# Patient Record
Sex: Male | Born: 1935 | Race: White | Hispanic: No | Smoking: Former smoker
Health system: Southern US, Community
[De-identification: ages and names within clinical notes are randomized; demographics above are authoritative.]

## PROBLEM LIST (undated history)

## (undated) DIAGNOSIS — I10 Essential (primary) hypertension: Secondary | ICD-10-CM

## (undated) DIAGNOSIS — C189 Malignant neoplasm of colon, unspecified: Secondary | ICD-10-CM

## (undated) DIAGNOSIS — D469 Myelodysplastic syndrome, unspecified: Principal | ICD-10-CM

## (undated) DIAGNOSIS — T8859XA Other complications of anesthesia, initial encounter: Secondary | ICD-10-CM

## (undated) DIAGNOSIS — H3530 Unspecified macular degeneration: Secondary | ICD-10-CM

## (undated) DIAGNOSIS — F419 Anxiety disorder, unspecified: Secondary | ICD-10-CM

## (undated) DIAGNOSIS — T4145XA Adverse effect of unspecified anesthetic, initial encounter: Secondary | ICD-10-CM

## (undated) DIAGNOSIS — H9311 Tinnitus, right ear: Secondary | ICD-10-CM

## (undated) DIAGNOSIS — K219 Gastro-esophageal reflux disease without esophagitis: Secondary | ICD-10-CM

## (undated) HISTORY — DX: Malignant neoplasm of colon, unspecified: C18.9

## (undated) HISTORY — DX: Unspecified macular degeneration: H35.30

## (undated) HISTORY — DX: Gastro-esophageal reflux disease without esophagitis: K21.9

## (undated) HISTORY — DX: Myelodysplastic syndrome, unspecified: D46.9

## (undated) HISTORY — PX: OTHER SURGICAL HISTORY: SHX169

## (undated) HISTORY — DX: Essential (primary) hypertension: I10

## (undated) HISTORY — PX: BACK SURGERY: SHX140

## (undated) HISTORY — DX: Tinnitus, right ear: H93.11

## (undated) HISTORY — DX: Anxiety disorder, unspecified: F41.9

## (undated) HISTORY — PX: NISSEN FUNDOPLICATION: SHX2091

---
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
## 2002-06-03 ENCOUNTER — Encounter: Admission: RE | Admit: 2002-06-03 | Discharge: 2002-06-03 | Payer: Self-pay | Admitting: Family Medicine

## 2002-06-03 ENCOUNTER — Encounter: Payer: Self-pay | Admitting: Family Medicine

## 2007-03-22 DIAGNOSIS — C189 Malignant neoplasm of colon, unspecified: Secondary | ICD-10-CM

## 2007-03-22 HISTORY — DX: Malignant neoplasm of colon, unspecified: C18.9

## 2007-03-26 ENCOUNTER — Encounter (INDEPENDENT_AMBULATORY_CARE_PROVIDER_SITE_OTHER): Payer: Self-pay | Admitting: Surgery

## 2007-03-26 ENCOUNTER — Inpatient Hospital Stay (HOSPITAL_COMMUNITY): Admission: RE | Admit: 2007-03-26 | Discharge: 2007-04-08 | Payer: Self-pay | Admitting: Surgery

## 2007-03-26 ENCOUNTER — Ambulatory Visit: Payer: Self-pay | Admitting: Oncology

## 2007-03-26 HISTORY — PX: COLON SURGERY: SHX602

## 2007-04-05 ENCOUNTER — Ambulatory Visit: Payer: Self-pay | Admitting: Oncology

## 2007-05-14 LAB — COMPREHENSIVE METABOLIC PANEL
ALT: 9 U/L (ref 0–53)
AST: 15 U/L (ref 0–37)
Albumin: 4 g/dL (ref 3.5–5.2)
Alkaline Phosphatase: 73 U/L (ref 39–117)
Calcium: 9.4 mg/dL (ref 8.4–10.5)
Chloride: 106 mEq/L (ref 96–112)
Potassium: 4.6 mEq/L (ref 3.5–5.3)
Sodium: 140 mEq/L (ref 135–145)
Total Protein: 6.8 g/dL (ref 6.0–8.3)

## 2007-05-14 LAB — CBC WITH DIFFERENTIAL/PLATELET
BASO%: 0.8 % (ref 0.0–2.0)
EOS%: 0.5 % (ref 0.0–7.0)
HGB: 13.7 g/dL (ref 13.0–17.1)
MCH: 32.3 pg (ref 28.0–33.4)
MCV: 92.6 fL (ref 81.6–98.0)
MONO%: 7 % (ref 0.0–13.0)
RBC: 4.25 10*6/uL (ref 4.20–5.71)
RDW: 12.9 % (ref 11.2–14.6)
lymph#: 1.8 10*3/uL (ref 0.9–3.3)

## 2007-05-30 ENCOUNTER — Ambulatory Visit: Payer: Self-pay | Admitting: Oncology

## 2007-06-03 LAB — CBC WITH DIFFERENTIAL/PLATELET
BASO%: 0.4 % (ref 0.0–2.0)
Basophils Absolute: 0 10*3/uL (ref 0.0–0.1)
EOS%: 0.8 % (ref 0.0–7.0)
HGB: 13.7 g/dL (ref 13.0–17.1)
MCH: 33.1 pg (ref 28.0–33.4)
MCHC: 35.1 g/dL (ref 32.0–35.9)
MCV: 94.3 fL (ref 81.6–98.0)
MONO%: 7.5 % (ref 0.0–13.0)
RDW: 18.1 % — ABNORMAL HIGH (ref 11.2–14.6)

## 2007-06-03 LAB — COMPREHENSIVE METABOLIC PANEL
ALT: 10 U/L (ref 0–53)
AST: 15 U/L (ref 0–37)
Albumin: 4.3 g/dL (ref 3.5–5.2)
Alkaline Phosphatase: 78 U/L (ref 39–117)
BUN: 10 mg/dL (ref 6–23)
Creatinine, Ser: 1.16 mg/dL (ref 0.40–1.50)
Potassium: 4.4 mEq/L (ref 3.5–5.3)

## 2007-06-27 LAB — CBC WITH DIFFERENTIAL/PLATELET
Basophils Absolute: 0 10*3/uL (ref 0.0–0.1)
EOS%: 1 % (ref 0.0–7.0)
HCT: 37.9 % — ABNORMAL LOW (ref 38.7–49.9)
HGB: 13.6 g/dL (ref 13.0–17.1)
MCH: 34.7 pg — ABNORMAL HIGH (ref 28.0–33.4)
MCHC: 35.8 g/dL (ref 32.0–35.9)
MCV: 97 fL (ref 81.6–98.0)
MONO%: 8.5 % (ref 0.0–13.0)
NEUT%: 67.2 % (ref 40.0–75.0)

## 2007-06-27 LAB — COMPREHENSIVE METABOLIC PANEL
AST: 17 U/L (ref 0–37)
Alkaline Phosphatase: 76 U/L (ref 39–117)
BUN: 12 mg/dL (ref 6–23)
Creatinine, Ser: 1.24 mg/dL (ref 0.40–1.50)
Total Bilirubin: 0.9 mg/dL (ref 0.3–1.2)

## 2007-07-12 ENCOUNTER — Ambulatory Visit: Payer: Self-pay | Admitting: Oncology

## 2007-07-16 LAB — CBC WITH DIFFERENTIAL/PLATELET
Basophils Absolute: 0 10*3/uL (ref 0.0–0.1)
EOS%: 1.6 % (ref 0.0–7.0)
HCT: 38.3 % — ABNORMAL LOW (ref 38.7–49.9)
HGB: 13.5 g/dL (ref 13.0–17.1)
MCH: 35 pg — ABNORMAL HIGH (ref 28.0–33.4)
MCV: 99.1 fL — ABNORMAL HIGH (ref 81.6–98.0)
MONO%: 6.6 % (ref 0.0–13.0)
NEUT%: 67.9 % (ref 40.0–75.0)
Platelets: 193 10*3/uL (ref 145–400)
lymph#: 1.6 10*3/uL (ref 0.9–3.3)

## 2007-07-16 LAB — COMPREHENSIVE METABOLIC PANEL
AST: 16 U/L (ref 0–37)
BUN: 12 mg/dL (ref 6–23)
Calcium: 9.5 mg/dL (ref 8.4–10.5)
Chloride: 106 mEq/L (ref 96–112)
Creatinine, Ser: 1.32 mg/dL (ref 0.40–1.50)

## 2007-08-08 LAB — CBC WITH DIFFERENTIAL/PLATELET
Basophils Absolute: 0.1 10*3/uL (ref 0.0–0.1)
Eosinophils Absolute: 0.2 10*3/uL (ref 0.0–0.5)
HCT: 36.4 % — ABNORMAL LOW (ref 38.7–49.9)
HGB: 12.7 g/dL — ABNORMAL LOW (ref 13.0–17.1)
MCH: 35.4 pg — ABNORMAL HIGH (ref 28.0–33.4)
MONO#: 0.7 10*3/uL (ref 0.1–0.9)
NEUT%: 65.1 % (ref 40.0–75.0)
WBC: 9.3 10*3/uL (ref 4.0–10.0)
lymph#: 2.3 10*3/uL (ref 0.9–3.3)

## 2007-09-02 ENCOUNTER — Ambulatory Visit: Payer: Self-pay | Admitting: Oncology

## 2007-10-11 LAB — COMPREHENSIVE METABOLIC PANEL
AST: 12 U/L (ref 0–37)
Albumin: 4.2 g/dL (ref 3.5–5.2)
Alkaline Phosphatase: 86 U/L (ref 39–117)
BUN: 14 mg/dL (ref 6–23)
Calcium: 9.2 mg/dL (ref 8.4–10.5)
Chloride: 106 mEq/L (ref 96–112)
Glucose, Bld: 106 mg/dL — ABNORMAL HIGH (ref 70–99)
Potassium: 4.1 mEq/L (ref 3.5–5.3)
Sodium: 140 mEq/L (ref 135–145)
Total Protein: 7.1 g/dL (ref 6.0–8.3)

## 2007-10-11 LAB — CBC WITH DIFFERENTIAL/PLATELET
Basophils Absolute: 0 10*3/uL (ref 0.0–0.1)
EOS%: 1 % (ref 0.0–7.0)
Eosinophils Absolute: 0.1 10*3/uL (ref 0.0–0.5)
HGB: 14.3 g/dL (ref 13.0–17.1)
MONO%: 7.1 % (ref 0.0–13.0)
NEUT#: 4 10*3/uL (ref 1.5–6.5)
RBC: 4.3 10*6/uL (ref 4.20–5.71)
RDW: 13 % (ref 11.2–14.6)
lymph#: 1.9 10*3/uL (ref 0.9–3.3)

## 2007-10-17 ENCOUNTER — Ambulatory Visit: Payer: Self-pay | Admitting: Oncology

## 2007-10-23 LAB — CBC WITH DIFFERENTIAL/PLATELET
BASO%: 0.4 % (ref 0.0–2.0)
EOS%: 1 % (ref 0.0–7.0)
HCT: 40.3 % (ref 38.7–49.9)
LYMPH%: 28.3 % (ref 14.0–48.0)
MCH: 32.6 pg (ref 28.0–33.4)
MCHC: 35.1 g/dL (ref 32.0–35.9)
MCV: 92.9 fL (ref 81.6–98.0)
MONO%: 8.7 % (ref 0.0–13.0)
NEUT%: 61.6 % (ref 40.0–75.0)
lymph#: 2 10*3/uL (ref 0.9–3.3)

## 2007-11-11 LAB — CBC WITH DIFFERENTIAL/PLATELET
BASO%: 1.6 % (ref 0.0–2.0)
EOS%: 1 % (ref 0.0–7.0)
HGB: 14.5 g/dL (ref 13.0–17.1)
MCH: 33.4 pg (ref 28.0–33.4)
MCHC: 35.4 g/dL (ref 32.0–35.9)
MCV: 94.3 fL (ref 81.6–98.0)
MONO%: 7.5 % (ref 0.0–13.0)
RBC: 4.34 10*6/uL (ref 4.20–5.71)
RDW: 16.8 % — ABNORMAL HIGH (ref 11.2–14.6)
lymph#: 2 10*3/uL (ref 0.9–3.3)

## 2007-12-03 ENCOUNTER — Ambulatory Visit: Payer: Self-pay | Admitting: Oncology

## 2007-12-05 LAB — CBC WITH DIFFERENTIAL/PLATELET
BASO%: 0.6 % (ref 0.0–2.0)
Basophils Absolute: 0 10*3/uL (ref 0.0–0.1)
HCT: 40.1 % (ref 38.7–49.9)
LYMPH%: 25.7 % (ref 14.0–48.0)
MCH: 33.4 pg (ref 28.0–33.4)
MCHC: 35.1 g/dL (ref 32.0–35.9)
MONO#: 0.6 10*3/uL (ref 0.1–0.9)
NEUT%: 63.1 % (ref 40.0–75.0)
Platelets: 205 10*3/uL (ref 145–400)
WBC: 7.4 10*3/uL (ref 4.0–10.0)

## 2008-03-03 ENCOUNTER — Ambulatory Visit: Payer: Self-pay | Admitting: Oncology

## 2008-06-03 ENCOUNTER — Ambulatory Visit: Payer: Self-pay | Admitting: Oncology

## 2008-06-04 LAB — CBC WITH DIFFERENTIAL/PLATELET
Basophils Absolute: 0 10*3/uL (ref 0.0–0.1)
Eosinophils Absolute: 0.1 10*3/uL (ref 0.0–0.5)
HGB: 14.4 g/dL (ref 13.0–17.1)
LYMPH%: 26.5 % (ref 14.0–48.0)
MCV: 89.8 fL (ref 81.6–98.0)
MONO%: 7.4 % (ref 0.0–13.0)
NEUT#: 3.3 10*3/uL (ref 1.5–6.5)
Platelets: 147 10*3/uL (ref 145–400)

## 2008-12-01 ENCOUNTER — Ambulatory Visit: Payer: Self-pay | Admitting: Oncology

## 2009-06-02 ENCOUNTER — Ambulatory Visit: Payer: Self-pay | Admitting: Oncology

## 2009-12-01 ENCOUNTER — Ambulatory Visit: Payer: Self-pay | Admitting: Oncology

## 2010-03-04 ENCOUNTER — Ambulatory Visit: Payer: Self-pay | Admitting: Oncology

## 2010-05-31 ENCOUNTER — Ambulatory Visit (HOSPITAL_BASED_OUTPATIENT_CLINIC_OR_DEPARTMENT_OTHER): Payer: PRIVATE HEALTH INSURANCE | Admitting: Oncology

## 2010-06-02 LAB — CEA: CEA: 2.4 ng/mL (ref 0.0–5.0)

## 2010-12-01 ENCOUNTER — Encounter (HOSPITAL_BASED_OUTPATIENT_CLINIC_OR_DEPARTMENT_OTHER): Payer: Medicare Other | Admitting: Oncology

## 2010-12-01 DIAGNOSIS — F411 Generalized anxiety disorder: Secondary | ICD-10-CM

## 2010-12-01 DIAGNOSIS — C182 Malignant neoplasm of ascending colon: Secondary | ICD-10-CM

## 2010-12-01 DIAGNOSIS — I10 Essential (primary) hypertension: Secondary | ICD-10-CM

## 2010-12-01 DIAGNOSIS — K219 Gastro-esophageal reflux disease without esophagitis: Secondary | ICD-10-CM

## 2011-01-03 NOTE — H&P (Signed)
NAMEDANIS, PEMBLETON NO.:  [id number]   MEDICAL RECORD NO.:  [id number]          PATIENT TYPE:  INP   LOCATION:  1539                         FACILITY:  [facility]   PHYSICIAN:  Wilmon Arms. Corliss Skains, M.D. DATE OF BIRTH:  08-Sep-1933   DATE OF ADMISSION:  03/26/2007  DATE OF DISCHARGE:  04/08/2007                              HISTORY & PHYSICAL   CHIEF COMPLAINT:  Right colon mass.   HISTORY OF PRESENT ILLNESS:  Patient is a 77 year old male, who recently  underwent a screening colonoscopy.  At the time of colonoscopy, he was  noted to have a 3 cm mass, just above the cecum.  There is another  sessile lesion nearby.  He had another lesion in his transverse colon.  The transverse colon lesion was a tubulovillous adenoma.  The ascending  colon lesion showed an adenomatous polyp with no high-grade dysplasia or  invasive malignancy.  The patient was then referred for surgical  resection.   MEDICATIONS:  Lotrel, Metoprolol, Lucentis, lorazepam, multivitamin,  Aleve and Trazodone.   ALLERGIES:  None.   PAST MEDICAL HISTORY:  Hypertension, anxiety, macular degeneration.  History of pneumonia and history of rheumatic fever.   PAST SURGICAL HISTORY:  Laparoscopic cholecystectomy, laparoscopic  Nissen fundal plication, open appendectomy, bilateral open inguinal  hernia repairs, tonsillectomy, multiple back surgeries.   SOCIAL HISTORY:  Nonsmoker, nondrinker.   FAMILY HISTORY:  Father is deceased and had hypertension, heart disease,  renal insufficiency.  Mother is deceased.   EXAMINATION:  VITAL SIGNS:  Patient is 6 feet 0 inches, weight 192,  blood pressure 146/82, pulse 70, temperature 97.1.  GENERAL:  This is an elderly male in no apparent distress.  HEENT:  EOMI.  Sclerae anicteric.  NECK:  No masses, no thyromegaly.  LUNGS:  Clear.  Normal respiratory effort.  HEART:  Regular rate and rhythm, no murmurs.  ABDOMEN:  Positive bowel sounds, soft, nontender.   Well-healed  laparoscopic incisions and right lower quadrant incision.  Well-healed  bilateral inguinal incisions.  SKIN:  Warm, dry, no sign of jaundice.  EXTREMITIES:  No edema.   IMPRESSION:  1. Adenomatous polyp in cecum.  2. 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   PLAN:  Recommend extended right hemicolectomy.  We will attempt to do  this laparoscopically with hand-assist.  We will schedule this after a  one-day bowel prep.      Wilmon Arms. Tsuei, M.D.  Electronically Signed     MKT/MEDQ  D:  04/19/2007  T:  04/19/2007  Job:  [id number]
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
## 2011-01-03 NOTE — Discharge Summary (Signed)
[name], WOODROME NO.:  [id number]   MEDICAL RECORD NO.:  [id number]          PATIENT TYPE:  INP   LOCATION:  1539                         FACILITY:  [facility]   PHYSICIAN:  Wilmon Arms. Corliss Skains, M.D. DATE OF BIRTH:  May 27, 1934   DATE OF ADMISSION:  03/26/2007  DATE OF DISCHARGE:  04/08/2007                               DISCHARGE SUMMARY   ADMISSION DIAGNOSIS:  Right colon mass.   DISCHARGE DIAGNOSES:  1. Right colon cancer with metastatic disease to the lymph nodes.  2. Prolonged postoperative ileus.   HOSPITAL COURSE:  The patient is a 77 year old male who recently  underwent a screening colonoscopy.  He was found to have a mass at the  cecum as well as a mass at the transverse colon.  He underwent a right  hemicolectomy on March 26, 2007.  He was noted to have a invasive  moderately differentiated colon cancer at the proximal descending colon.  He had 2/26 lymph nodes positive.  He had T3 lesion.  Postoperatively,  the patient had a prolonged ileus.  He has remained hemodynamically  stable throughout.  He had a nasogastric tube in place for about a week  before he began having bowel movements.  A CT scan showed postoperative  changes, but no sign of abscess or leak.  The NG tube was removed after  he regained bowel function.  The patient then began having copious  diarrhea.  He was tested for Clostridium difficile.  This was negative.  He was started on Lomotil and his bowel movements have regained a  consistency.   DISCHARGE MEDICATIONS:  1. Lomotil p.r.n. for diarrhea.  2. Darvocet p.r.n. for pain.  He is using minimal pain medication.   SPECIAL INSTRUCTIONS:  His staples are removed prior to discharge.  He  may shower.   ACTIVITY:  He should refrain from any heavy lifting.   FOLLOW UP:  He has an appointment arranged with Dr. Truett Perna tomorrow.  He needs an appointment to see me in 2 weeks for routine check.  He is  instructed to call us if he  develops any symptoms such as fever,  increasing abdominal pain, nausea or vomiting.      Wilmon Arms. Tsuei, M.D.  Electronically Signed     MKT/MEDQ  D:  04/08/2007  T:  04/08/2007  Job:  [id number]   cc:   Jordan Hawks. Elnoria Howard, MD  Fax: [phone]   Leighton Roach. Truett Perna, M.D.  Fax: [phone]

## 2011-01-03 NOTE — Op Note (Signed)
[name], [name] NO.:  [id number]   MEDICAL RECORD NO.:  [id number]          PATIENT TYPE:  INP   LOCATION:  0002                         FACILITY:  [facility]   PHYSICIAN:  Wilmon Arms. Corliss Skains, M.D. DATE OF BIRTH:  September 18, 1933   DATE OF PROCEDURE:  03/26/2007  DATE OF DISCHARGE:                               OPERATIVE REPORT   PREOPERATIVE DIAGNOSES:  1. Adenomatous polyp in the cecum.  2. Tubulovillous adenoma of the transverse colon.   POSTOPERATIVE DIAGNOSES:  1. Adenomatous polyp in the cecum.  2. Tubulovillous adenoma of the transverse colon.   PROCEDURE PERFORMED:  Laparoscopic extended right hemicolectomy.   SURGEON:  Wilmon Arms. Corliss Skains, M.D.   ASSISTANT:  Leonie Man, M.D.   ANESTHESIA:  General endotracheal.   INDICATIONS:  The patient is a 77 year old male who recently underwent a  screening colonoscopy.  He was noted to have a 3-cm mass in the cecum  which turned out to be an adenomatous polyp with no malignancy or  dysplasia.  The transverse colon mass was noted to be a tubulovillous  adenoma.  Both of these were reportedly tattooed at the time of  colonoscopy.  The patient now presents for elective extended right  hemicolectomy.   DESCRIPTION OF PROCEDURE:  The patient was brought to the operating  room, placed in supine position on the operating room table.  After an  adequate level of general anesthesia was obtained, a Foley catheter was  placed under sterile technique.  The patient's legs were placed in  lithotomy position in yellow fin stirrups.  His abdomen was shaved and  prepped with Betadine, draped in a sterile fashion. A 1-cm vertical  incision was made 3 cm above the umbilicus.  The 10-mm OptiVu port was  placed under direct vision into the peritoneal cavity.  We insufflated  with CO2 maintaining a maximum pressure of 15 mmHg.  We inserted the  laparoscope and inspected the abdomen.  There were adhesions to the  liver from his  previous laparoscopic cholecystectomy.  There was also  another blot of adhesions in the left upper quadrant where the patient  had a previous laparoscopic Nissen fundoplication.  We made a 7-cm  incision in the lower midline.  The GelPort was then inserted.  A 5-mm  port was placed in the left lower quadrant.  We began mobilizing the  cecum and the right colon by attaching the lateral attachments at the  white line of Toldt with cautery.  We continued this up around the  hepatic flexure.  We had to take down all of the omental adhesions to  the liver from his previous cholecystectomy.  We continued her  dissection across the transverse colon near the splenic flexure.  The  patient has a large amount of omentum.  We then began detaching the  omentum off of the transverse colon using the 5 mm LigaSure device.  We  took this off from right to left.  We left the attachments to the distal  transverse colon. The omentum was then placed in the left upper quadrant  to keep  it out of the way.  At this point, we could not visualize the  tattooed areas as described by the colonoscopist.  We felt that we had  good mobility of the colon but could not adequately identify this area.  We turned the scope down towards the pelvis.  I mobilized the cecum and  the terminal ileum from its attachments.  We could identify the right  ureter.  At this point since we could not identify the areas that need  to be resected, a decision was made to extend our incision.  The  laparoscopic ports were removed.  We then connected our hand assist  incision with the upper midline incision.  The Balfour retractor was  placed.  We divided the terminal ileum 10 cm proximal to the ileocecal  valve.  We then bluntly dissected the right colon medially.  The  duodenum was identified.  With careful manual examination, we were able  to identify two tiny areas of tattooing, one in the cecum and one in the  mid transverse colon.  We  divided the transverse colon approximately 7  cm distal to the tattooed area.  This was done with the GIA stapler.  The mesentery was then taken down with the LigaSure device.  We created  a side-to-side anastomosis with a GIA stapler.  The enterotomy was  closed with a TA-60 stapler.  A reinforcing crotch suture of 3-0 silk  was placed.  The abdomen was then thoroughly irrigated with warm saline.  The omentum was placed over the anastomosis.  The fascia was closed with  double-stranded #1 PDS suture.  Skin staples were used to close the  skin.  The patient was then extubated and brought to recovery in stable  condition.  All sponge, instrument, and needle counts correct.      Wilmon Arms. Tsuei, M.D.  Electronically Signed     MKT/MEDQ  D:  03/26/2007  T:  03/26/2007  Job:  [id number]

## 2011-06-01 ENCOUNTER — Encounter (HOSPITAL_BASED_OUTPATIENT_CLINIC_OR_DEPARTMENT_OTHER): Payer: Medicare Other | Admitting: Oncology

## 2011-06-01 ENCOUNTER — Other Ambulatory Visit: Payer: Self-pay | Admitting: Oncology

## 2011-06-01 DIAGNOSIS — Z23 Encounter for immunization: Secondary | ICD-10-CM

## 2011-06-01 DIAGNOSIS — C182 Malignant neoplasm of ascending colon: Secondary | ICD-10-CM

## 2011-06-01 DIAGNOSIS — R109 Unspecified abdominal pain: Secondary | ICD-10-CM

## 2011-06-02 LAB — COMPREHENSIVE METABOLIC PANEL
ALT: 162 — ABNORMAL HIGH
Alkaline Phosphatase: 70
BUN: 17
CO2: 23
Chloride: 106
Glucose, Bld: 132 — ABNORMAL HIGH
Potassium: 4.1
Sodium: 134 — ABNORMAL LOW
Total Bilirubin: 0.8

## 2011-06-02 LAB — MAGNESIUM: Magnesium: 2.1

## 2011-06-02 LAB — BASIC METABOLIC PANEL
BUN: 19
CO2: 25
Chloride: 108
Creatinine, Ser: 1.11
GFR calc non Af Amer: >60
Glucose, Bld: 131 — ABNORMAL HIGH
Glucose, Bld: 132 — ABNORMAL HIGH
Potassium: 4
Potassium: 4.5
Sodium: 139

## 2011-06-02 LAB — PHOSPHORUS: Phosphorus: 3.7

## 2011-06-05 LAB — BASIC METABOLIC PANEL
BUN: 10
BUN: 5 — ABNORMAL LOW
BUN: 7
CO2: 28
Chloride: 105
Chloride: 106
Creatinine, Ser: 1.22
Creatinine, Ser: 1.29
Creatinine, Ser: 1.67 — ABNORMAL HIGH
GFR calc Af Amer: 49 — ABNORMAL LOW
GFR calc Af Amer: >60
GFR calc non Af Amer: 41 — ABNORMAL LOW
GFR calc non Af Amer: 59 — ABNORMAL LOW
Glucose, Bld: 134 — ABNORMAL HIGH
Glucose, Bld: 146 — ABNORMAL HIGH
Potassium: 3.8
Potassium: 3.8
Potassium: 4.4
Potassium: 4.5

## 2011-06-05 LAB — DIFFERENTIAL
Eosinophils Relative: 1
Lymphocytes Relative: 15
Lymphocytes Relative: 23
Lymphs Abs: 1.6
Lymphs Abs: 1.8
Monocytes Absolute: 0.6
Monocytes Relative: 8
Neutro Abs: 5.5
Neutro Abs: 8 — ABNORMAL HIGH
Neutrophils Relative %: 76

## 2011-06-05 LAB — CBC
HCT: 35.7 — ABNORMAL LOW
HCT: 36.9 — ABNORMAL LOW
HCT: 37.1 — ABNORMAL LOW
HCT: 37.2 — ABNORMAL LOW
HCT: 38.4 — ABNORMAL LOW
HCT: 42.2
HCT: 44.8
Hemoglobin: 13.1
MCHC: 34.9
MCHC: 35.2
MCHC: 35.2
MCV: 90.9
MCV: 91.3
MCV: 91.4
MCV: 92.1
MCV: 92.1
Platelets: 175
Platelets: 183
Platelets: 199
Platelets: 265
Platelets: 270
Platelets: 289
RBC: 3.76 — ABNORMAL LOW
RBC: 4.1 — ABNORMAL LOW
RDW: 12.4
RDW: 12.4
RDW: 12.5
RDW: 12.7
RDW: 12.9
WBC: 10.4
WBC: 14.8 — ABNORMAL HIGH
WBC: 8
WBC: 9.6

## 2011-06-05 LAB — COMPREHENSIVE METABOLIC PANEL
AST: 182 — ABNORMAL HIGH
AST: 19
Albumin: 2.9 — ABNORMAL LOW
Albumin: 4
Alkaline Phosphatase: 70
BUN: 4 — ABNORMAL LOW
BUN: 5 — ABNORMAL LOW
BUN: 8
CO2: 27
Calcium: 8.5
Chloride: 106
Creatinine, Ser: 0.95
Creatinine, Ser: 1.16
GFR calc Af Amer: >60
GFR calc Af Amer: >60
GFR calc non Af Amer: >60
Glucose, Bld: 126 — ABNORMAL HIGH
Potassium: 3.5
Potassium: 4.6
Sodium: 140
Total Protein: 5.9 — ABNORMAL LOW
Total Protein: 7.5

## 2011-06-05 LAB — MAGNESIUM: Magnesium: 1.8

## 2011-06-05 LAB — CLOSTRIDIUM DIFFICILE EIA: C difficile Toxins A+B, EIA: NEGATIVE

## 2011-06-05 LAB — CHOLESTEROL, TOTAL: Cholesterol: 111

## 2011-06-05 LAB — PREALBUMIN: Prealbumin: 8 — ABNORMAL LOW

## 2011-06-05 LAB — PHOSPHORUS: Phosphorus: 3.3

## 2011-06-05 LAB — TRIGLYCERIDES: Triglycerides: 120

## 2011-08-30 DIAGNOSIS — H353290 Exudative age-related macular degeneration, unspecified eye, stage unspecified: Secondary | ICD-10-CM | POA: Diagnosis not present

## 2011-08-30 DIAGNOSIS — H35059 Retinal neovascularization, unspecified, unspecified eye: Secondary | ICD-10-CM | POA: Diagnosis not present

## 2011-09-13 ENCOUNTER — Telehealth: Payer: Self-pay | Admitting: Oncology

## 2011-09-13 NOTE — Telephone Encounter (Signed)
pts wife called and scheduled appts for april2013

## 2011-10-10 DIAGNOSIS — H353290 Exudative age-related macular degeneration, unspecified eye, stage unspecified: Secondary | ICD-10-CM | POA: Diagnosis not present

## 2011-10-10 DIAGNOSIS — H35059 Retinal neovascularization, unspecified, unspecified eye: Secondary | ICD-10-CM | POA: Diagnosis not present

## 2011-11-06 DIAGNOSIS — R609 Edema, unspecified: Secondary | ICD-10-CM | POA: Diagnosis not present

## 2011-11-06 DIAGNOSIS — J449 Chronic obstructive pulmonary disease, unspecified: Secondary | ICD-10-CM | POA: Diagnosis not present

## 2011-11-06 DIAGNOSIS — Z125 Encounter for screening for malignant neoplasm of prostate: Secondary | ICD-10-CM | POA: Diagnosis not present

## 2011-11-06 DIAGNOSIS — I10 Essential (primary) hypertension: Secondary | ICD-10-CM | POA: Diagnosis not present

## 2011-11-06 DIAGNOSIS — E785 Hyperlipidemia, unspecified: Secondary | ICD-10-CM | POA: Diagnosis not present

## 2011-11-09 DIAGNOSIS — D649 Anemia, unspecified: Secondary | ICD-10-CM | POA: Diagnosis not present

## 2011-11-10 ENCOUNTER — Other Ambulatory Visit: Payer: Self-pay | Admitting: *Deleted

## 2011-11-10 ENCOUNTER — Telehealth: Payer: Self-pay | Admitting: *Deleted

## 2011-11-10 ENCOUNTER — Encounter (HOSPITAL_COMMUNITY)
Admission: RE | Admit: 2011-11-10 | Discharge: 2011-11-10 | Disposition: A | Discharge status: Home / Self Care | Payer: Medicare Other | Source: Ambulatory Visit | Attending: Oncology | Admitting: Oncology

## 2011-11-10 ENCOUNTER — Encounter: Payer: Self-pay | Admitting: *Deleted

## 2011-11-10 DIAGNOSIS — D649 Anemia, unspecified: Secondary | ICD-10-CM

## 2011-11-10 DIAGNOSIS — C182 Malignant neoplasm of ascending colon: Secondary | ICD-10-CM

## 2011-11-10 NOTE — Telephone Encounter (Signed)
Spoke with pt, he reports dyspnea with moderate exertion. Denies any bleeding/ chest pain. Pt stated he feels OK to wait until next week for transfusion. Instructed pt to go to ED for any chest pain or worsening dyspnea.  Reviewed with Dr. Truett Perna: pt scheduled for Lab/office and possible transfusion 3/25. Called sister in law, Deloris: appt given, she says she will call pt with date and time.

## 2011-11-10 NOTE — Telephone Encounter (Signed)
Received fax from [facility] requesting sooner follow up with Dr. Truett Perna for low labs. HGB 6 on 11/06/11. Called [facility]: spoke with Pam, pt had not yet been transfused, was told they will address low HGB.  Labs reviewed by Dr. Truett Perna, will bring pt in sooner to be evaluated.  Received message on voicemail from [facility] stating they are arranging for pt to be transfused, he is OK to wait until his April appt.  Returned call, spoke with Elita Quick, she stated pt is not symptomatic and will be transfused 11/15/11 at [facility] short stay.   Attempted to call pt, no answer. Left message on voicemail for Deloris to have pt call office.

## 2011-11-13 ENCOUNTER — Ambulatory Visit (HOSPITAL_BASED_OUTPATIENT_CLINIC_OR_DEPARTMENT_OTHER): Payer: Medicare Other | Admitting: Nurse Practitioner

## 2011-11-13 ENCOUNTER — Ambulatory Visit (HOSPITAL_BASED_OUTPATIENT_CLINIC_OR_DEPARTMENT_OTHER): Payer: Medicare Other

## 2011-11-13 ENCOUNTER — Other Ambulatory Visit (HOSPITAL_COMMUNITY): Payer: Self-pay | Admitting: *Deleted

## 2011-11-13 ENCOUNTER — Telehealth: Payer: Self-pay | Admitting: Oncology

## 2011-11-13 ENCOUNTER — Other Ambulatory Visit (HOSPITAL_BASED_OUTPATIENT_CLINIC_OR_DEPARTMENT_OTHER): Payer: Medicare Other

## 2011-11-13 VITALS — BP 141/74 | HR 74 | Temp 97.1°F | Resp 20

## 2011-11-13 VITALS — BP 129/65 | HR 93 | Temp 97.1°F | Ht 71.0 in | Wt 196.6 lb

## 2011-11-13 DIAGNOSIS — Z5189 Encounter for other specified aftercare: Secondary | ICD-10-CM | POA: Diagnosis not present

## 2011-11-13 DIAGNOSIS — D509 Iron deficiency anemia, unspecified: Secondary | ICD-10-CM

## 2011-11-13 DIAGNOSIS — C182 Malignant neoplasm of ascending colon: Secondary | ICD-10-CM | POA: Diagnosis not present

## 2011-11-13 DIAGNOSIS — D649 Anemia, unspecified: Secondary | ICD-10-CM

## 2011-11-13 DIAGNOSIS — C189 Malignant neoplasm of colon, unspecified: Secondary | ICD-10-CM

## 2011-11-13 DIAGNOSIS — R58 Hemorrhage, not elsewhere classified: Secondary | ICD-10-CM

## 2011-11-13 DIAGNOSIS — D696 Thrombocytopenia, unspecified: Secondary | ICD-10-CM | POA: Diagnosis not present

## 2011-11-13 DIAGNOSIS — R195 Other fecal abnormalities: Secondary | ICD-10-CM

## 2011-11-13 DIAGNOSIS — D699 Hemorrhagic condition, unspecified: Secondary | ICD-10-CM

## 2011-11-13 LAB — LACTATE DEHYDROGENASE: LDH: 263 U/L — ABNORMAL HIGH (ref 94–250)

## 2011-11-13 LAB — CBC WITH DIFFERENTIAL/PLATELET
BASO%: 0 % (ref 0.0–2.0)
EOS%: 0.2 % (ref 0.0–7.0)
HGB: 5.9 g/dL — CL (ref 13.0–17.1)
MCH: 22.6 pg — ABNORMAL LOW (ref 27.2–33.4)
MCHC: 31.3 g/dL — ABNORMAL LOW (ref 32.0–36.0)
MONO%: 35.4 % — ABNORMAL HIGH (ref 0.0–14.0)
RBC: 2.59 10*6/uL — ABNORMAL LOW (ref 4.20–5.82)
RDW: 20.7 % — ABNORMAL HIGH (ref 11.0–14.6)
lymph#: 1 10*3/uL (ref 0.9–3.3)

## 2011-11-13 LAB — TECHNOLOGIST REVIEW

## 2011-11-13 LAB — HOLD TUBE, BLOOD BANK

## 2011-11-13 LAB — PREPARE RBC (CROSSMATCH)

## 2011-11-13 LAB — RETICULOCYTES
Immature Retic Fract: 15.8 % — ABNORMAL HIGH (ref 3.00–10.60)
RBC: 2.68 10*6/uL — ABNORMAL LOW (ref 4.20–5.82)

## 2011-11-13 LAB — CEA: CEA: 1.7 ng/mL (ref 0.0–5.0)

## 2011-11-13 LAB — PROTHROMBIN TIME
INR: 1.18 (ref <1.50)
Prothrombin Time: 15.3 seconds — ABNORMAL HIGH (ref 11.6–15.2)

## 2011-11-13 LAB — ABO/RH: ABO/RH(D): O POS

## 2011-11-13 LAB — CHCC SMEAR

## 2011-11-13 MED ORDER — SODIUM CHLORIDE 0.9 % IV SOLN: 250.0000 mL | Freq: Once | INTRAVENOUS | Status: DC

## 2011-11-13 NOTE — Telephone Encounter (Signed)
Gv pt appt for march-april2013.  scheduled appt with Dr. Elnoria Howard for 11/15/2011.  sent request to medical rec to fax over notes to there office

## 2011-11-13 NOTE — Progress Notes (Signed)
OFFICE PROGRESS NOTE  Interval history:  Mr. Whitenack is a 78 year old man diagnosed with stage III colon cancer in August 2008. He completed adjuvant Xeloda chemotherapy in April 2009. He underwent a negative surveillance colonoscopy in May 2011.  We received labs from Mr. Crossan's primary care provider done 11/06/2011 showing a hemoglobin of 6.0, white count 3.0 and platelet count 88,000.  He denies bleeding. Specifically no hematochezia or melena. Recently he has felt weak. He denies shortness of breath. No chest pain. For the past 2-3 months he has been taking Aleve 3 times a day. He takes the Aleve for "various aches and pains".   Objective: Blood pressure 129/65, pulse 93, temperature 97.1 F (36.2 C), temperature source Oral, height 5\' 11"  (1.803 m), weight 196 lb 9.6 oz (89.177 kg).  Oropharynx is without thrush or ulceration. No palpable cervical, supraclavicular or axillary lymph nodes. Lungs are clear. Regular cardiac rhythm. Abdomen is soft and nontender. No organomegaly. Pitting edema at the lower legs bilaterally. Stool is brown, hemoccult positive. No rectal mass.  Lab Results: Lab Results  Component Value Date   WBC 3.9* 11/13/2011   HGB 5.9* 11/13/2011   HCT 18.7* 11/13/2011   MCV 72.4* 11/13/2011   PLT 41* 11/13/2011    Chemistry:    Chemistry      Component Value Date/Time   NA 140 10/11/2007 1013   K 4.1 10/11/2007 1013   CL 106 10/11/2007 1013   CO2 25 10/11/2007 1013   BUN 14 10/11/2007 1013   CREATININE 1.14 10/11/2007 1013      Component Value Date/Time   CALCIUM 9.2 10/11/2007 1013   ALKPHOS 86 10/11/2007 1013   AST 12 10/11/2007 1013   ALT 9 10/11/2007 1013   BILITOT 0.8 10/11/2007 1013     Peripheral blood smear-RBC: Aeration in red cell size; hypochromic; ovalocytes, teardrops, no schistocytes. WBC: Increased monocytes. Platelets: Decreased in number, some large.  Studies/Results: No results found.  Medications: I have reviewed the patient's current  medications.  Assessment/Plan:  1. Stage III colon cancer diagnosed in August 2008, status post adjuvant Xeloda chemotherapy, completed in April 2009.  He underwent a negative surveillance colonoscopy in May 2011.  2. History of increased tearing, status post right tear duct stent placement.  3. History of multiple colonic polyps, status post a negative colonoscopy by Dr. Elnoria Howard in May 2011.  4. Anxiety disorder.  5. Multiple back surgeries.  6. Hypertension.  7. Gastroesophageal reflux disease, status post a Nissen fundoplication.  8. Macular degeneration, followed by Dr. Luciana Axe.  9. Right ear "tinnitus," followed by Dr. Haroldine Laws.  10. Severe microcytic anemia. 11. Hemoccult positive stool. 12. Thrombocytopenia. 13. Mild leukopenia.  Disposition-Mr. Tiggs has a severe microcytic anemia. He is symptomatic. The hematologic indices appear most consistent with iron deficiency. Typically the platelet count increases with iron deficiency. However, in rare cases we have seen thrombocytopenia with iron deficiency. We are transfusing Mr. Gagen one unit of blood today and one tomorrow. We are making a referral to gastroenterology. We are obtaining additional labs to include a ferritin, LDH, PT and PTT. He will begin ferrous sulfate 3 times daily. He will return for a followup CBC on 11/17/2011. Dr. Truett Perna will decide on proceeding with a bone marrow biopsy pending outstanding labs and his response to oral iron. He will return for a followup visit on 11/21/2011. He will contact the office in the interim with any problems.  Patient seen with Dr. Truett Perna.  Lonna Cobb ANP/GNP-BC

## 2011-11-14 ENCOUNTER — Other Ambulatory Visit: Payer: Self-pay | Admitting: Nurse Practitioner

## 2011-11-14 ENCOUNTER — Telehealth: Payer: Self-pay | Admitting: Oncology

## 2011-11-14 ENCOUNTER — Ambulatory Visit (HOSPITAL_BASED_OUTPATIENT_CLINIC_OR_DEPARTMENT_OTHER): Payer: Medicare Other

## 2011-11-14 VITALS — BP 135/60 | HR 74 | Temp 96.7°F | Resp 18

## 2011-11-14 DIAGNOSIS — D649 Anemia, unspecified: Secondary | ICD-10-CM

## 2011-11-14 MED ORDER — SODIUM CHLORIDE 0.9 % IV SOLN: 250.0000 mL | Freq: Once | INTRAVENOUS | Status: DC

## 2011-11-14 NOTE — Patient Instructions (Signed)
Blood Transfusion   A blood transfusion replaces your blood or some of its parts. Blood is replaced when you have lost blood because of surgery, an accident, or for severe blood conditions like anemia.  You can donate blood to be used on yourself if you have a planned surgery. If you lose blood during that surgery, your own blood can be given back to you.  Any blood given to you is checked to make sure it matches your blood type. Your temperature, blood pressure, and heart rate (vital signs) will be checked often.   GET HELP RIGHT AWAY IF:    You feel sick to your stomach (nauseous) or throw up (vomit).   You have watery poop (diarrhea).   You have shortness of breath or trouble breathing.   You have blood in your pee (urine) or have dark colored pee.   You have chest pain or tightness.   Your eyes or skin turn yellow (jaundice).   You have a temperature by mouth above 102 F (38.9 C), not controlled by medicine.   You start to shake and have chills.   You develop a a red rash (hives) or feel itchy.   You develop lightheadedness or feel confused.   You develop back, joint, or muscle pain.   You do not feel hungry (lost appetite).   You feel tired, restless, or nervous.   You develop belly (abdominal) cramps.  Document Released: 11/03/2008 Document Revised: 07/27/2011 Document Reviewed: 11/03/2008  [employer] Patient Information 2012 [employer].

## 2011-11-14 NOTE — Telephone Encounter (Signed)
Gv pt appt for march-april2013.  scheduled appt with Dr. Elnoria Howard for 11/15/2011 @ 10:45am

## 2011-11-15 ENCOUNTER — Encounter (HOSPITAL_COMMUNITY): Payer: Medicare Other

## 2011-11-15 DIAGNOSIS — D500 Iron deficiency anemia secondary to blood loss (chronic): Secondary | ICD-10-CM | POA: Diagnosis not present

## 2011-11-15 LAB — TYPE AND SCREEN: Unit division: 0

## 2011-11-16 DIAGNOSIS — R195 Other fecal abnormalities: Secondary | ICD-10-CM | POA: Diagnosis not present

## 2011-11-16 DIAGNOSIS — D509 Iron deficiency anemia, unspecified: Secondary | ICD-10-CM | POA: Diagnosis not present

## 2011-11-16 DIAGNOSIS — K921 Melena: Secondary | ICD-10-CM | POA: Diagnosis not present

## 2011-11-16 DIAGNOSIS — K319 Disease of stomach and duodenum, unspecified: Secondary | ICD-10-CM | POA: Diagnosis not present

## 2011-11-16 DIAGNOSIS — D126 Benign neoplasm of colon, unspecified: Secondary | ICD-10-CM | POA: Diagnosis not present

## 2011-11-17 ENCOUNTER — Other Ambulatory Visit (HOSPITAL_BASED_OUTPATIENT_CLINIC_OR_DEPARTMENT_OTHER): Payer: Medicare Other | Admitting: Lab

## 2011-11-17 DIAGNOSIS — D649 Anemia, unspecified: Secondary | ICD-10-CM | POA: Diagnosis not present

## 2011-11-17 LAB — CBC WITH DIFFERENTIAL/PLATELET
BASO%: 0 % (ref 0.0–2.0)
Eosinophils Absolute: 0 10*3/uL (ref 0.0–0.5)
LYMPH%: 29.4 % (ref 14.0–49.0)
MCHC: 29.8 g/dL — ABNORMAL LOW (ref 32.0–36.0)
MONO#: 1.9 10*3/uL — ABNORMAL HIGH (ref 0.1–0.9)
NEUT#: 1.4 10*3/uL — ABNORMAL LOW (ref 1.5–6.5)
Platelets: 44 10*3/uL — ABNORMAL LOW (ref 140–400)
RBC: 3.35 10*6/uL — ABNORMAL LOW (ref 4.20–5.82)
RDW: 20.3 % — ABNORMAL HIGH (ref 11.0–14.6)
WBC: 4.8 10*3/uL (ref 4.0–10.3)
lymph#: 1.4 10*3/uL (ref 0.9–3.3)
nRBC: 0 % (ref 0–0)

## 2011-11-17 LAB — TECHNOLOGIST REVIEW

## 2011-11-21 ENCOUNTER — Ambulatory Visit (HOSPITAL_BASED_OUTPATIENT_CLINIC_OR_DEPARTMENT_OTHER): Payer: Medicare Other | Admitting: Nurse Practitioner

## 2011-11-21 ENCOUNTER — Other Ambulatory Visit (HOSPITAL_BASED_OUTPATIENT_CLINIC_OR_DEPARTMENT_OTHER): Payer: Medicare Other | Admitting: Lab

## 2011-11-21 ENCOUNTER — Telehealth: Payer: Self-pay | Admitting: Oncology

## 2011-11-21 DIAGNOSIS — D509 Iron deficiency anemia, unspecified: Secondary | ICD-10-CM

## 2011-11-21 DIAGNOSIS — D696 Thrombocytopenia, unspecified: Secondary | ICD-10-CM | POA: Diagnosis not present

## 2011-11-21 DIAGNOSIS — D72819 Decreased white blood cell count, unspecified: Secondary | ICD-10-CM

## 2011-11-21 DIAGNOSIS — D649 Anemia, unspecified: Secondary | ICD-10-CM

## 2011-11-21 DIAGNOSIS — C182 Malignant neoplasm of ascending colon: Secondary | ICD-10-CM | POA: Diagnosis not present

## 2011-11-21 DIAGNOSIS — R58 Hemorrhage, not elsewhere classified: Secondary | ICD-10-CM

## 2011-11-21 LAB — CBC WITH DIFFERENTIAL/PLATELET
EOS%: 0.1 % (ref 0.0–7.0)
Eosinophils Absolute: 0 10*3/uL (ref 0.0–0.5)
MCH: 24.3 pg — ABNORMAL LOW (ref 27.2–33.4)
MCV: 79.9 fL (ref 79.3–98.0)
MONO%: 42.9 % — ABNORMAL HIGH (ref 0.0–14.0)
NEUT#: 1.5 10*3/uL (ref 1.5–6.5)
RBC: 3.4 10*6/uL — ABNORMAL LOW (ref 4.20–5.82)
RDW: 22.3 % — ABNORMAL HIGH (ref 11.0–14.6)
lymph#: 1 10*3/uL (ref 0.9–3.3)
nRBC: 0 % (ref 0–0)

## 2011-11-21 NOTE — Progress Notes (Signed)
OFFICE PROGRESS NOTE  Interval history:  [name] returns as scheduled. Ferritin on 11/13/2011 returned low at 7; PTT was in normal range at 35; PTT mildly elevated at 15.3; LDH was elevated at 263. He underwent an upper endoscopy by Dr. Elnoria Howard on 11/16/2011. Findings included moderate gastritis and question atypical duodenal AVMs. He also underwent a colonoscopy on 11/16/2011 with findings of multiple polyps, hemorrhoids and diverticula.  [name] reports he is taking oral iron 3 times a day. He notes black stools since beginning the iron. Energy level continues to be poor. He has stable dyspnea on exertion.   Objective: Blood pressure 141/71, pulse 78, temperature 97.2 F (36.2 C), temperature source Oral, height 5\' 11"  (1.803 m), weight 192 lb 8 oz (87.317 kg).  Oropharynx is without thrush or ulceration. Lungs are clear. Regular cardiac rhythm. Abdomen is soft and nontender. No organomegaly. Trace bilateral lower leg edema. Calves are soft and nontender.  Lab Results: Lab Results  Component Value Date   WBC 4.4 11/21/2011   HGB 8.3* 11/21/2011   HCT 27.2* 11/21/2011   MCV 79.9 11/21/2011   PLT 52* 11/21/2011    Chemistry:    Chemistry      Component Value Date/Time   NA 140 10/11/2007 1013   K 4.1 10/11/2007 1013   CL 106 10/11/2007 1013   CO2 25 10/11/2007 1013   BUN 14 10/11/2007 1013   CREATININE 1.14 10/11/2007 1013      Component Value Date/Time   CALCIUM 9.2 10/11/2007 1013   ALKPHOS 86 10/11/2007 1013   AST 12 10/11/2007 1013   ALT 9 10/11/2007 1013   BILITOT 0.8 10/11/2007 1013       Studies/Results: No results found.  Medications: I have reviewed the patient's current medications.  Assessment/Plan:  1. Stage III colon cancer diagnosed in August 2008, status post adjuvant Xeloda chemotherapy, completed in April 2009. He underwent a negative surveillance colonoscopy in May 2011.  2. History of increased tearing, status post right tear duct stent placement.  3. History of  multiple colonic polyps, status post a negative colonoscopy by Dr. Elnoria Howard in May 2011.  4. Anxiety disorder.  5. Multiple back surgeries.  6. Hypertension.  7. Gastroesophageal reflux disease, status post a Nissen fundoplication.  8. Macular degeneration, followed by Dr. Luciana Axe.  9. Right ear "tinnitus," followed by Dr. Haroldine Laws.  10. Severe microcytic anemia. Ferritin returned low at 7 on 11/13/2011. He was transfused 2 units of blood. He is taking ferrous sulfate 3 times daily. 11. Hemoccult positive stool. He underwent an upper endoscopy on 11/16/2011 with findings of moderate gastritis and question atypical duodenal AVMs. There was no evidence of active bleeding. Colonoscopy also on 11/16/2011 showed multiple polyps, hemorrhoids and diverticula. 12. Thrombocytopenia. 13. Mild leukopenia. 14. Mildly elevated LDH 11/13/2011. 15. Mildly elevated PT 11/13/2011.  Disposition-[name] recently presented with a severe microcytic anemia. He was found to have iron deficiency. He was transfused 2 units of blood and is now taking oral iron. He is status post an upper endoscopy and colonoscopy on 11/16/2011 with no active bleeding noted. He also has thrombocytopenia and a mild leukopenia of unclear etiology. Dr. Truett Perna recommends proceeding with a bone marrow biopsy. We will see [name] in followup on 12/01/2011 to review the results of the bone marrow biopsy. He will contact the office in the interim with any problems.  Patient seen with Dr. Truett Perna.  Lonna Cobb ANP/GNP-BC

## 2011-11-21 NOTE — Telephone Encounter (Signed)
gve the pt his April 2013 appt calendar. S/w tiffany from rad scheduling and she will call the pt regarding the ct guided biopsy appt

## 2011-11-22 ENCOUNTER — Other Ambulatory Visit: Payer: Self-pay | Admitting: Radiology

## 2011-11-22 ENCOUNTER — Other Ambulatory Visit: Payer: Self-pay | Admitting: Physician Assistant

## 2011-11-28 ENCOUNTER — Encounter (HOSPITAL_COMMUNITY): Payer: Self-pay

## 2011-11-28 ENCOUNTER — Ambulatory Visit (HOSPITAL_COMMUNITY)
Admission: RE | Admit: 2011-11-28 | Discharge: 2011-11-28 | Disposition: A | Discharge status: Home / Self Care | Payer: Medicare Other | Source: Ambulatory Visit | Attending: Nurse Practitioner | Admitting: Nurse Practitioner

## 2011-11-28 ENCOUNTER — Other Ambulatory Visit: Payer: Self-pay | Admitting: Radiology

## 2011-11-28 ENCOUNTER — Ambulatory Visit (HOSPITAL_COMMUNITY)
Admission: RE | Admit: 2011-11-28 | Discharge: 2011-11-28 | Disposition: A | Discharge status: Home / Self Care | Payer: Medicare Other | Source: Ambulatory Visit | Attending: Oncology | Admitting: Oncology

## 2011-11-28 DIAGNOSIS — D61818 Other pancytopenia: Secondary | ICD-10-CM | POA: Diagnosis not present

## 2011-11-28 DIAGNOSIS — D649 Anemia, unspecified: Secondary | ICD-10-CM | POA: Diagnosis not present

## 2011-11-28 DIAGNOSIS — D46Z Other myelodysplastic syndromes: Secondary | ICD-10-CM | POA: Diagnosis not present

## 2011-11-28 DIAGNOSIS — I10 Essential (primary) hypertension: Secondary | ICD-10-CM | POA: Diagnosis not present

## 2011-11-28 DIAGNOSIS — K219 Gastro-esophageal reflux disease without esophagitis: Secondary | ICD-10-CM | POA: Diagnosis not present

## 2011-11-28 DIAGNOSIS — D469 Myelodysplastic syndrome, unspecified: Secondary | ICD-10-CM | POA: Insufficient documentation

## 2011-11-28 DIAGNOSIS — Z79899 Other long term (current) drug therapy: Secondary | ICD-10-CM | POA: Diagnosis not present

## 2011-11-28 DIAGNOSIS — Z85038 Personal history of other malignant neoplasm of large intestine: Secondary | ICD-10-CM | POA: Insufficient documentation

## 2011-11-28 DIAGNOSIS — IMO0001 Reserved for inherently not codable concepts without codable children: Secondary | ICD-10-CM

## 2011-11-28 HISTORY — DX: Adverse effect of unspecified anesthetic, initial encounter: T41.45XA

## 2011-11-28 HISTORY — DX: Other complications of anesthesia, initial encounter: T88.59XA

## 2011-11-28 HISTORY — DX: Reserved for inherently not codable concepts without codable children: IMO0001

## 2011-11-28 LAB — CBC
HCT: 31.7 % — ABNORMAL LOW (ref 39.0–52.0)
Hemoglobin: 9.6 g/dL — ABNORMAL LOW (ref 13.0–17.0)
MCHC: 30.3 g/dL (ref 30.0–36.0)
MCV: 81.1 fL (ref 78.0–100.0)
RDW: 21.7 % — ABNORMAL HIGH (ref 11.5–15.5)

## 2011-11-28 MED ORDER — MIDAZOLAM HCL 2 MG/2ML IJ SOLN
INTRAMUSCULAR | Status: AC
  Filled 2011-11-28: qty 4

## 2011-11-28 MED ORDER — MIDAZOLAM HCL 5 MG/5ML IJ SOLN
INTRAMUSCULAR | Status: AC | PRN
  Administered 2011-11-28: 1 mg via INTRAVENOUS

## 2011-11-28 MED ORDER — FENTANYL CITRATE 0.05 MG/ML IJ SOLN
INTRAMUSCULAR | Status: AC
  Filled 2011-11-28: qty 4

## 2011-11-28 MED ORDER — SODIUM CHLORIDE 0.9 % IV SOLN
INTRAVENOUS | Status: DC
  Administered 2011-11-28: 08:00:00 via INTRAVENOUS

## 2011-11-28 MED ORDER — FENTANYL CITRATE 0.05 MG/ML IJ SOLN
INTRAMUSCULAR | Status: AC | PRN
  Administered 2011-11-28 (×2): 50 ug via INTRAVENOUS

## 2011-11-28 MED ORDER — FENTANYL CITRATE 0.05 MG/ML IJ SOLN
INTRAMUSCULAR | Status: AC
  Filled 2011-11-28: qty 2

## 2011-11-28 MED ORDER — MIDAZOLAM HCL 2 MG/2ML IJ SOLN
INTRAMUSCULAR | Status: AC
  Filled 2011-11-28: qty 2
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
## 2011-11-28 NOTE — Discharge Instructions (Signed)
Moderate Sedation, Adult Moderate sedation is given to help you relax or even sleep through a procedure. You may remain sleepy, be clumsy, or have poor balance for several hours following this procedure. Arrange for a responsible adult, family member, or friend to take you home. A responsible adult should stay with you for at least 24 hours or until the medicines have worn off.  Do not participate in any activities where you could become injured for the next 24 hours, or until you feel normal again. Do not:   Drive.   Swim.   Ride a bicycle.   Operate heavy machinery.   Cook.   Use power tools.   Climb ladders.   Work at heights.   Do not make important decisions or sign legal documents until you are improved.   Bone Marrow Aspiration, Bone Marrow Biopsy Care After Read the instructions outlined below and refer to this sheet in the next few weeks. These discharge instructions provide you with general information on caring for yourself after you leave the hospital. Your caregiver may also give you specific instructions. While your treatment has been planned according to the most current medical practices available, unavoidable complications occasionally occur. If you have any problems or questions after discharge, call your caregiver. FINDING OUT THE RESULTS OF YOUR TEST Not all test results are available during your visit. If your test results are not back during the visit, make an appointment with your caregiver to find out the results. Do not assume everything is normal if you have not heard from your caregiver or the medical facility. It is important for you to follow up on all of your test results.  HOME CARE INSTRUCTIONS  You have had sedation and may be sleepy or dizzy. Your thinking may not be as clear as usual. For the next 24 hours:  Only take over-the-counter or prescription medicines for pain, discomfort, and or fever as directed by your caregiver.   Do not drink alcohol.     Do not smoke.   Do not drive.   Do not make important legal decisions.   Do not operate heavy machinery.   Do not care for small children by yourself.   Keep your dressing clean and dry. You may replace dressing with a bandage after 24 hours.   You may take a bath or shower after 24 hours.   Use an ice pack for 20 minutes every 2 hours while awake for pain as needed.  SEEK MEDICAL CARE IF:   There is redness, swelling, or increasing pain at the biopsy site.   There is pus coming from the biopsy site.   There is drainage from a biopsy site lasting longer than one day.   An unexplained oral temperature above 102 F (38.9 C) develops.  SEEK IMMEDIATE MEDICAL CARE IF:   You develop a rash.   You have difficulty breathing.   You develop any reaction or side effects to medications given.  Document Released: 02/24/2005 Document Revised: 07/27/2011 Document Reviewed: 08/04/2008 Exit[employer] Patient Information 2012 [employer].   Vomiting may occur if you eat too soon. When you can drink without vomiting, try water, juice, or soup. Try solid foods if you feel little or no nausea.   Only take over-the-counter or prescription medications for pain, discomfort, or fever as directed by your caregiver.If pain medications have been prescribed for you, ask your caregiver how soon it is safe to take them.   Make sure you and your family   fully understands everything about the medication given to you. Make sure you understand what side effects may occur.   You should not drink alcohol, take sleeping pills, or medications that cause drowsiness for at least 24 hours.   If you smoke, do not smoke alone.   If you are feeling better, you may resume normal activities 24 hours after receiving sedation.   Keep all appointments as scheduled. Follow all instructions.   Ask questions if you do not understand.  SEEK MEDICAL CARE IF:   Your skin is pale or bluish in color.   You continue  to feel sick to your stomach (nauseous) or throw up (vomit).   Your pain is getting worse and not helped by medication.   You have bleeding or swelling.   You are still sleepy or feeling clumsy after 24 hours.  SEEK IMMEDIATE MEDICAL CARE IF:   You develop a rash.   You have difficulty breathing.   You develop any type of allergic problem.   You have a fever.  Document Released: 05/02/2001 Document Revised: 07/27/2011 Document Reviewed: 09/23/2007 [employer] Patient Information 2012 [employer]. 

## 2011-11-28 NOTE — Progress Notes (Signed)
Ambulated pt to BR with assist and voided moderate amount urine and tolerated this well.Still has dime sized spot on dressing and no oozing

## 2011-11-28 NOTE — Procedures (Signed)
Technically successful CT guided BM aspiration and biopsy. No immediate complications. Awaiting pathology report.

## 2011-11-30 ENCOUNTER — Telehealth: Payer: Self-pay | Admitting: *Deleted

## 2011-11-30 ENCOUNTER — Ambulatory Visit (HOSPITAL_BASED_OUTPATIENT_CLINIC_OR_DEPARTMENT_OTHER): Payer: Medicare Other | Admitting: Lab

## 2011-11-30 ENCOUNTER — Ambulatory Visit: Payer: PRIVATE HEALTH INSURANCE | Admitting: Nurse Practitioner

## 2011-11-30 ENCOUNTER — Other Ambulatory Visit: Payer: PRIVATE HEALTH INSURANCE | Admitting: Lab

## 2011-11-30 ENCOUNTER — Other Ambulatory Visit: Payer: Self-pay | Admitting: *Deleted

## 2011-11-30 DIAGNOSIS — D61818 Other pancytopenia: Secondary | ICD-10-CM | POA: Diagnosis not present

## 2011-11-30 LAB — CBC WITH DIFFERENTIAL/PLATELET
BASO%: 0.3 % (ref 0.0–2.0)
Basophils Absolute: 0 10*3/uL (ref 0.0–0.1)
HCT: 29.7 % — ABNORMAL LOW (ref 38.4–49.9)
HGB: 9 g/dL — ABNORMAL LOW (ref 13.0–17.1)
LYMPH%: 15 % (ref 14.0–49.0)
MCHC: 30.2 g/dL — ABNORMAL LOW (ref 32.0–36.0)
MONO#: 2.6 10*3/uL — ABNORMAL HIGH (ref 0.1–0.9)
NEUT%: 48.5 % (ref 39.0–75.0)
Platelets: 160 10*3/uL (ref 140–400)
WBC: 7.3 10*3/uL (ref 4.0–10.3)
lymph#: 1.1 10*3/uL (ref 0.9–3.3)

## 2011-11-30 NOTE — Telephone Encounter (Signed)
Spoke with pt and sister in law in lobby. Labs are OK. Keep MD appt on 4/12. Will not need lab appt. They both verbalize understanding.

## 2011-11-30 NOTE — Telephone Encounter (Signed)
Call from pt's sister in law reporting he had an incontinent black stool overnight and another this morning. She reports pt is feeling weak. Reviewed with Dr. Truett Perna: Have pt come in for CBC and type and hold today. She verbalized understanding. Pt had bone marrow procedure done 11/28/11.

## 2011-12-01 ENCOUNTER — Other Ambulatory Visit: Payer: Medicare Other | Admitting: Lab

## 2011-12-01 ENCOUNTER — Ambulatory Visit (HOSPITAL_BASED_OUTPATIENT_CLINIC_OR_DEPARTMENT_OTHER): Payer: Medicare Other | Admitting: Nurse Practitioner

## 2011-12-01 ENCOUNTER — Telehealth: Payer: Self-pay | Admitting: Oncology

## 2011-12-01 ENCOUNTER — Encounter: Payer: Self-pay | Admitting: Nurse Practitioner

## 2011-12-01 VITALS — BP 143/68 | HR 87 | Temp 97.3°F | Ht 71.0 in | Wt 190.3 lb

## 2011-12-01 DIAGNOSIS — D509 Iron deficiency anemia, unspecified: Secondary | ICD-10-CM

## 2011-12-01 DIAGNOSIS — C182 Malignant neoplasm of ascending colon: Secondary | ICD-10-CM

## 2011-12-01 DIAGNOSIS — R195 Other fecal abnormalities: Secondary | ICD-10-CM | POA: Diagnosis not present

## 2011-12-01 DIAGNOSIS — D4620 Refractory anemia with excess of blasts, unspecified: Secondary | ICD-10-CM

## 2011-12-01 NOTE — Telephone Encounter (Signed)
Gv pt appt for april-may2013 

## 2011-12-01 NOTE — Progress Notes (Signed)
OFFICE PROGRESS NOTE  Interval history:  [name] returns as scheduled. He underwent a bone marrow biopsy in interventional radiology on 11/28/2011. 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 There were dyspoietic changes primarily involving the granulocytic and megakaryocytic cell lines. This was associated with an increased number of blastic cells estimated at 12% of all cells in the sample. There was no evidence of metastatic carcinoma. Iron stores were decreased. Cytogenetic studies are pending.  [name] notes improvement in his energy level. He denies shortness of breath. No bleeding. He continues to note black stools since beginning oral iron. He had multiple loose stools on 11/30/2011. The loose stools occured after eating "black eyed peas". He had a normal bowel movement earlier today.   Objective: Blood pressure 143/68, pulse 87, temperature 97.3 F (36.3 C), temperature source Oral, height 5\' 11"  (1.803 m), weight 190 lb 4.8 oz (86.32 kg).  Oropharynx is without thrush or ulceration. Lungs are clear. Regular cardiac rhythm. Abdomen is soft and nontender. No organomegaly. Extremities are without edema. Bone marrow site is nontender and without erythema.  Lab Results: Lab Results  Component Value Date   WBC 7.3 11/30/2011   HGB 9.0* 11/30/2011   HCT 29.7* 11/30/2011   MCV 80.5 11/30/2011   PLT 160 11/30/2011    Chemistry:    Chemistry      Component Value Date/Time   NA 140 10/11/2007 1013   K 4.1 10/11/2007 1013   CL 106 10/11/2007 1013   CO2 25 10/11/2007 1013   BUN 14 10/11/2007 1013   CREATININE 1.14 10/11/2007 1013      Component Value Date/Time   CALCIUM 9.2 10/11/2007 1013   ALKPHOS 86 10/11/2007 1013   AST 12 10/11/2007 1013   ALT 9 10/11/2007 1013   BILITOT 0.8 10/11/2007 1013       Studies/Results: Ct Biopsy  11/28/2011  *RADIOLOGY REPORT*  Indication: Pancytopenia  CT GUIDED  RIGHT ILIAC BONE MARROW ASPIRATION AND BONE MARROW CORE BIOPSIES  Medications: Fentanyl 150 mcg IV; Versed 2 mg IV  Sedation time: 15 minutes  Contrast volume: None  Complications: None immediate  PROCEDURE/FINDINGS:  Informed consent was obtained from the patient following an explanation of the procedure, risks, benefits and alternatives. The patient understands, agrees and consents for the procedure. All questions were addressed.  A time out was performed prior to the initiation of the procedure.  The patient was positioned prone and noncontrast localization CT was performed of the pelvis to demonstrate the iliac marrow spaces.  The operative site was prepped and draped in the usual sterile fashion.  Under sterile conditions and local anesthesia, an 11 gauge coaxial bone biopsy needle was advanced into the right iliac marrow space. Needle position was confirmed with CT imaging. Initially, bone marrow aspiration was performed. This was followed by the acquisition of a bone marrow biopsy with the 11 gauge needle.  The 11 gauge coaxial bone biopsy needle was re-advanced into the right iliac marrow space.  Positioning was again confirmed with CT and a final bone marrow biopsy was obtained with the 11 gauge bone marrow biopsy device.  Samples were prepared with the cytotechnologist. The needle was removed intact.  Hemostasis was obtained with compression and a dressing was placed. The patient tolerated the procedure well without immediate post procedural complication.  IMPRESSION:  Successful CT guided right iliac bone marrow aspiration and core biopsies.  Original Report Authenticated By: Alfredia Ferguson  V, M.D.    Medications: I have reviewed the patient's current medications.  Assessment/Plan:  1. Stage III colon cancer diagnosed in August 2008, status post adjuvant Xeloda chemotherapy, completed in April 2009. He underwent a negative surveillance colonoscopy in May 2011.  2. History of increased tearing,  status post right tear duct stent placement.  3. History of multiple colonic polyps, status post a negative colonoscopy by Dr. Elnoria Howard in May 2011.  4. Anxiety disorder.  5. Multiple back surgeries.  6. Hypertension.  7. Gastroesophageal reflux disease, status post a Nissen fundoplication.  8. Macular degeneration, followed by Dr. Luciana Axe.  9. Right ear "tinnitus," followed by Dr. Haroldine Laws.  10. Severe microcytic anemia. Ferritin returned low at 7 on 11/13/2011. He was transfused 2 units of blood. He is taking ferrous sulfate 3 times daily. Bone marrow biopsy on 11/28/2011 confirmed decreased iron stores. 11. Hemoccult positive stool. He underwent an upper endoscopy on 11/16/2011 with findings of moderate gastritis and question atypical duodenal AVMs. There was no evidence of active bleeding. Colonoscopy also on 11/16/2011 showed multiple polyps, hemorrhoids and diverticula. 12. Thrombocytopenia. Improved. 13. Mild leukopenia. Improved. 14. Mildly elevated LDH 11/13/2011. 15. Mildly elevated PT 11/13/2011. 16. 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 There was no evidence of metastatic carcinoma. Storage iron was decreased. Cytogenetic analysis is pending.  Disposition-[name] appears stable. 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 We will followup on the cytogenetic analysis. Hemoglobin and platelet count have improved since beginning oral iron. He was instructed to continue ferrous sulfate 3 times daily. We will have him return in 2 weeks for CBC and in 4 weeks for an office visit. He will contact the office in the interim with any problems.  Plan reviewed with Dr. Truett Perna.  Lonna Cobb ANP/GNP-BC

## 2011-12-13 DIAGNOSIS — H353290 Exudative age-related macular degeneration, unspecified eye, stage unspecified: Secondary | ICD-10-CM | POA: Diagnosis not present

## 2011-12-13 DIAGNOSIS — H35059 Retinal neovascularization, unspecified, unspecified eye: Secondary | ICD-10-CM | POA: Diagnosis not present

## 2011-12-14 ENCOUNTER — Telehealth: Payer: Self-pay | Admitting: *Deleted

## 2011-12-14 ENCOUNTER — Other Ambulatory Visit (HOSPITAL_BASED_OUTPATIENT_CLINIC_OR_DEPARTMENT_OTHER): Payer: Medicare Other | Admitting: Lab

## 2011-12-14 DIAGNOSIS — D4620 Refractory anemia with excess of blasts, unspecified: Secondary | ICD-10-CM | POA: Diagnosis not present

## 2011-12-14 LAB — CBC WITH DIFFERENTIAL/PLATELET
Basophils Absolute: 0 10*3/uL (ref 0.0–0.1)
Eosinophils Absolute: 0 10*3/uL (ref 0.0–0.5)
HCT: 35 % — ABNORMAL LOW (ref 38.4–49.9)
HGB: 10.8 g/dL — ABNORMAL LOW (ref 13.0–17.1)
LYMPH%: 26.2 % (ref 14.0–49.0)
MCV: 82.9 fL (ref 79.3–98.0)
MONO#: 2.1 10*3/uL — ABNORMAL HIGH (ref 0.1–0.9)
MONO%: 49.1 % — ABNORMAL HIGH (ref 0.0–14.0)
NEUT#: 1.1 10*3/uL — ABNORMAL LOW (ref 1.5–6.5)
NEUT%: 24.5 % — ABNORMAL LOW (ref 39.0–75.0)
Platelets: 53 10*3/uL — ABNORMAL LOW (ref 140–400)
RBC: 4.22 10*6/uL (ref 4.20–5.82)
WBC: 4.3 10*3/uL (ref 4.0–10.3)
nRBC: 0 % (ref 0–0)

## 2011-12-14 NOTE — Telephone Encounter (Signed)
Called pt, continue iron. Follow up as scheduled. Call office for bleeding. Pt asked several questions, requested I call his sister in law with instructions. Left message for her to call office.

## 2011-12-28 ENCOUNTER — Telehealth: Payer: Self-pay | Admitting: Oncology

## 2011-12-28 ENCOUNTER — Ambulatory Visit (HOSPITAL_BASED_OUTPATIENT_CLINIC_OR_DEPARTMENT_OTHER): Payer: Medicare Other | Admitting: Oncology

## 2011-12-28 ENCOUNTER — Other Ambulatory Visit (HOSPITAL_BASED_OUTPATIENT_CLINIC_OR_DEPARTMENT_OTHER): Payer: Medicare Other | Admitting: Lab

## 2011-12-28 VITALS — BP 124/69 | HR 87 | Temp 97.0°F | Ht 71.0 in | Wt 190.0 lb

## 2011-12-28 DIAGNOSIS — F411 Generalized anxiety disorder: Secondary | ICD-10-CM | POA: Diagnosis not present

## 2011-12-28 DIAGNOSIS — D649 Anemia, unspecified: Secondary | ICD-10-CM

## 2011-12-28 DIAGNOSIS — D696 Thrombocytopenia, unspecified: Secondary | ICD-10-CM | POA: Diagnosis not present

## 2011-12-28 DIAGNOSIS — Q068 Other specified congenital malformations of spinal cord: Secondary | ICD-10-CM

## 2011-12-28 DIAGNOSIS — C182 Malignant neoplasm of ascending colon: Secondary | ICD-10-CM | POA: Diagnosis not present

## 2011-12-28 DIAGNOSIS — D4620 Refractory anemia with excess of blasts, unspecified: Secondary | ICD-10-CM | POA: Diagnosis not present

## 2011-12-28 LAB — CBC WITH DIFFERENTIAL/PLATELET
Basophils Absolute: 0 10*3/uL (ref 0.0–0.1)
EOS%: 0 % (ref 0.0–7.0)
Eosinophils Absolute: 0 10*3/uL (ref 0.0–0.5)
HCT: 36.1 % — ABNORMAL LOW (ref 38.4–49.9)
HGB: 11.7 g/dL — ABNORMAL LOW (ref 13.0–17.1)
MCH: 27.1 pg — ABNORMAL LOW (ref 27.2–33.4)
MCV: 83.6 fL (ref 79.3–98.0)
MONO%: 47.6 % — ABNORMAL HIGH (ref 0.0–14.0)
NEUT#: 1 10*3/uL — ABNORMAL LOW (ref 1.5–6.5)
NEUT%: 25.8 % — ABNORMAL LOW (ref 39.0–75.0)
Platelets: 59 10*3/uL — ABNORMAL LOW (ref 140–400)
RDW: 21 % — ABNORMAL HIGH (ref 11.0–14.6)

## 2011-12-28 NOTE — Progress Notes (Signed)
   [facility]    OFFICE PROGRESS NOTE   INTERVAL HISTORY:   He returns as scheduled. He is taking iron 3 times per day. He reports a good energy level. No difficulty with bowel function. He reports soreness at the medial aspect of the left breast.  Objective:  Vital signs in last 24 hours:  Blood pressure 124/69, pulse 87, temperature 97 F (36.1 C), temperature source Oral, height 5\' 11"  (1.803 m), weight 190 lb (86.183 kg).   Resp: Distant breath sounds, lungs clear bilaterally, no respiratory distress Cardio: Regular rate and rhythm GI: No hepatosplenomegaly, no mass Vascular: Trace pretibial edema bilaterally Musculoskeletal: There is irregular subcutaneous tissue throughout the left breast without a discrete mass.  Skin: Slightly raised crusted lesion at the right temple    Lab Results:  Lab Results  Component Value Date   WBC 4.0 12/28/2011   HGB 11.7* 12/28/2011   HCT 36.1* 12/28/2011   MCV 83.6 12/28/2011   PLT 59* 12/28/2011   ANC 1.0    Medications: I have reviewed the patient's current medications.  Assessment/Plan: 1. Stage III colon cancer diagnosed in August 2008, status post adjuvant Xeloda chemotherapy, completed in April 2009. He underwent a negative surveillance colonoscopy in May 2011.  2. History of increased tearing, status post right tear duct stent placement.  3. History of multiple colonic polyps, status post a negative colonoscopy by Dr. Elnoria Howard in May 2011.  4. Anxiety disorder.  5. Multiple back surgeries.  6. Hypertension.  7. Gastroesophageal reflux disease, status post a Nissen fundoplication.  8. Macular degeneration, followed by Dr. Luciana Axe.  9. Right ear "tinnitus," followed by Dr. Haroldine Laws.  10. Severe microcytic anemia. Ferritin returned low at 7 on 11/13/2011. He was transfused 2 units of blood. He is taking ferrous sulfate 3 times daily. Bone marrow biopsy on 11/28/2011 confirmed decreased iron stores. 11. Hemoccult positive  stool. He underwent an upper endoscopy on 11/16/2011 with findings of moderate gastritis and question atypical duodenal AVMs. There was no evidence of active bleeding. Colonoscopy also on 11/16/2011 showed multiple polyps, hemorrhoids and diverticula. 12. Thrombocytopenia. Stable. 13. Mild leukopenia. Stable. 14. Mildly elevated LDH 11/13/2011. 15. Mildly elevated PT 11/13/2011. 16. 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 There was no evidence of metastatic carcinoma. Storage iron was decreased. Cytogenetic returned with a normal 46 XY karyotype. A molecular FISH panel was negative. 17. Right preauricular/temple skin lesion-? Basal cell carcinoma. I recommended he schedule an appointment with his dermatologist.   Disposition:  The hemoglobin has improved significantly since he started iron therapy. 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 He will continue iron. The plan is to hold on chemotherapy unless the anemia and other cytopenias progress. He will return for a CBC in one month. [name] is scheduled for an office visit in 2 months.   Thornton Papas, MD  12/28/2011  2:39 PM

## 2011-12-28 NOTE — Telephone Encounter (Signed)
gve the pt his June,july 2013 appt calendar 

## 2012-01-17 DIAGNOSIS — L570 Actinic keratosis: Secondary | ICD-10-CM | POA: Diagnosis not present

## 2012-01-17 DIAGNOSIS — D1801 Hemangioma of skin and subcutaneous tissue: Secondary | ICD-10-CM | POA: Diagnosis not present

## 2012-01-25 ENCOUNTER — Other Ambulatory Visit (HOSPITAL_BASED_OUTPATIENT_CLINIC_OR_DEPARTMENT_OTHER): Payer: Medicare Other | Admitting: Lab

## 2012-01-25 ENCOUNTER — Telehealth: Payer: Self-pay | Admitting: *Deleted

## 2012-01-25 DIAGNOSIS — C182 Malignant neoplasm of ascending colon: Secondary | ICD-10-CM

## 2012-01-25 DIAGNOSIS — D649 Anemia, unspecified: Secondary | ICD-10-CM

## 2012-01-25 LAB — CBC WITH DIFFERENTIAL/PLATELET
BASO%: 0.1 % (ref 0.0–2.0)
EOS%: 0.1 % (ref 0.0–7.0)
MCH: 29.2 pg (ref 27.2–33.4)
MCHC: 33.3 g/dL (ref 32.0–36.0)
MCV: 87.6 fL (ref 79.3–98.0)
MONO%: 40.1 % — ABNORMAL HIGH (ref 0.0–14.0)
RBC: 4.61 10*6/uL (ref 4.20–5.82)
RDW: 19.4 % — ABNORMAL HIGH (ref 11.0–14.6)
lymph#: 1.1 10*3/uL (ref 0.9–3.3)
nRBC: 0 % (ref 0–0)

## 2012-01-25 NOTE — Telephone Encounter (Signed)
Lab results reviewed by Lonna Cobb, NP: Instructed patient to continue his iron tid as ordered. Call for any bleeding or bruising. Will recheck CBC on his 02/27/12 visit.

## 2012-01-31 ENCOUNTER — Telehealth: Payer: Self-pay | Admitting: *Deleted

## 2012-01-31 NOTE — Telephone Encounter (Signed)
Called pt, Dr. Truett Perna recommends checking CBC around 6/20. He requests we call his sister in law, Deloris with appt. Request sent to schedulers.

## 2012-02-07 DIAGNOSIS — H35059 Retinal neovascularization, unspecified, unspecified eye: Secondary | ICD-10-CM | POA: Diagnosis not present

## 2012-02-07 DIAGNOSIS — H353290 Exudative age-related macular degeneration, unspecified eye, stage unspecified: Secondary | ICD-10-CM | POA: Diagnosis not present

## 2012-02-08 ENCOUNTER — Other Ambulatory Visit (HOSPITAL_BASED_OUTPATIENT_CLINIC_OR_DEPARTMENT_OTHER): Payer: Medicare Other | Admitting: Lab

## 2012-02-08 DIAGNOSIS — C182 Malignant neoplasm of ascending colon: Secondary | ICD-10-CM

## 2012-02-08 DIAGNOSIS — Q068 Other specified congenital malformations of spinal cord: Secondary | ICD-10-CM

## 2012-02-08 LAB — CBC WITH DIFFERENTIAL/PLATELET
BASO%: 0.3 % (ref 0.0–2.0)
Basophils Absolute: 0 10*3/uL (ref 0.0–0.1)
EOS%: 0.1 % (ref 0.0–7.0)
Eosinophils Absolute: 0 10*3/uL (ref 0.0–0.5)
HCT: 43 % (ref 38.4–49.9)
HGB: 14.2 g/dL (ref 13.0–17.1)
LYMPH%: 28.5 % (ref 14.0–49.0)
MCH: 29.6 pg (ref 27.2–33.4)
MCHC: 33.1 g/dL (ref 32.0–36.0)
MCV: 89.5 fL (ref 79.3–98.0)
MONO#: 1.1 10*3/uL — ABNORMAL HIGH (ref 0.1–0.9)
MONO%: 32 % — ABNORMAL HIGH (ref 0.0–14.0)
NEUT#: 1.4 10*3/uL — ABNORMAL LOW (ref 1.5–6.5)
NEUT%: 39.1 % (ref 39.0–75.0)
Platelets: 65 10*3/uL — ABNORMAL LOW (ref 140–400)
RBC: 4.8 10*6/uL (ref 4.20–5.82)
RDW: 18.2 % — ABNORMAL HIGH (ref 11.0–14.6)
WBC: 3.6 10*3/uL — ABNORMAL LOW (ref 4.0–10.3)
lymph#: 1 10*3/uL (ref 0.9–3.3)

## 2012-02-09 ENCOUNTER — Telehealth: Payer: Self-pay

## 2012-02-09 NOTE — Telephone Encounter (Signed)
Pt notified that Hgb and platelets are better, continue iron and follow up as scheduled. Pt verbalized understanding.

## 2012-02-27 ENCOUNTER — Telehealth: Payer: Self-pay | Admitting: Oncology

## 2012-02-27 ENCOUNTER — Other Ambulatory Visit (HOSPITAL_BASED_OUTPATIENT_CLINIC_OR_DEPARTMENT_OTHER): Payer: Medicare Other | Admitting: Lab

## 2012-02-27 ENCOUNTER — Ambulatory Visit (HOSPITAL_BASED_OUTPATIENT_CLINIC_OR_DEPARTMENT_OTHER): Payer: Medicare Other | Admitting: Nurse Practitioner

## 2012-02-27 VITALS — BP 144/77 | HR 78 | Temp 97.9°F | Ht 71.0 in | Wt 189.5 lb

## 2012-02-27 DIAGNOSIS — C182 Malignant neoplasm of ascending colon: Secondary | ICD-10-CM

## 2012-02-27 DIAGNOSIS — D509 Iron deficiency anemia, unspecified: Secondary | ICD-10-CM

## 2012-02-27 DIAGNOSIS — L989 Disorder of the skin and subcutaneous tissue, unspecified: Secondary | ICD-10-CM

## 2012-02-27 DIAGNOSIS — D696 Thrombocytopenia, unspecified: Secondary | ICD-10-CM | POA: Diagnosis not present

## 2012-02-27 LAB — CBC WITH DIFFERENTIAL/PLATELET
BASO%: 0.2 % (ref 0.0–2.0)
Eosinophils Absolute: 0 10*3/uL (ref 0.0–0.5)
HCT: 42.6 % (ref 38.4–49.9)
MCHC: 33.8 g/dL (ref 32.0–36.0)
MONO#: 1.7 10*3/uL — ABNORMAL HIGH (ref 0.1–0.9)
NEUT#: 1.8 10*3/uL (ref 1.5–6.5)
NEUT%: 40.1 % (ref 39.0–75.0)
RBC: 4.75 10*6/uL (ref 4.20–5.82)
WBC: 4.4 10*3/uL (ref 4.0–10.3)
lymph#: 1 10*3/uL (ref 0.9–3.3)

## 2012-02-27 NOTE — Progress Notes (Signed)
OFFICE PROGRESS NOTE  Interval history:  Mr. Sulton returns as scheduled. He continues oral iron 3 times daily. He denies bleeding. He notes easy bruising. No fever.   Objective: Blood pressure 144/77, pulse 78, temperature 97.9 F (36.6 C), temperature source Oral, height 5\' 11"  (1.803 m), weight 189 lb 8 oz (85.957 kg).  Oropharynx is without thrush or ulceration. Lungs are clear. Regular cardiac rhythm. Abdomen soft and nontender. No organomegaly. Extremities without edema. Small ecchymoses scattered over the forearms. Lipomas present over bilateral lower arms.  Lab Results: Lab Results  Component Value Date   WBC 4.4 02/27/2012   HGB 14.4 02/27/2012   HCT 42.6 02/27/2012   MCV 89.7 02/27/2012   PLT 47* 02/27/2012    Chemistry:    Chemistry      Component Value Date/Time   NA 140 10/11/2007 1013   K 4.1 10/11/2007 1013   CL 106 10/11/2007 1013   CO2 25 10/11/2007 1013   BUN 14 10/11/2007 1013   CREATININE 1.14 10/11/2007 1013      Component Value Date/Time   CALCIUM 9.2 10/11/2007 1013   ALKPHOS 86 10/11/2007 1013   AST 12 10/11/2007 1013   ALT 9 10/11/2007 1013   BILITOT 0.8 10/11/2007 1013       Studies/Results: No results found.  Medications: I have reviewed the patient's current medications.  Assessment/Plan:  1. Stage III colon cancer diagnosed in August 2008, status post adjuvant Xeloda chemotherapy, completed in April 2009. He underwent a negative surveillance colonoscopy in May 2011.  2. History of increased tearing, status post right tear duct stent placement.  3. History of multiple colonic polyps, status post a negative colonoscopy by Dr. Elnoria Howard in May 2011.  4. Anxiety disorder.  5. Multiple back surgeries.  6. Hypertension.  7. Gastroesophageal reflux disease, status post a Nissen fundoplication.  8. Macular degeneration, followed by Dr. Luciana Axe.  9. Right ear "tinnitus," followed by Dr. Haroldine Laws.  10. Severe microcytic anemia. Ferritin returned low at 7 on  11/13/2011. He was transfused 2 units of blood. He is taking ferrous sulfate 3 times daily. Bone marrow biopsy on 11/28/2011 confirmed decreased iron stores. Hemoglobin has normalized. He will decreased oral iron to 1daily. 11. Hemoccult positive stool. He underwent an upper endoscopy on 11/16/2011 with findings of moderate gastritis and question atypical duodenal AVMs. There was no evidence of active bleeding. Colonoscopy also on 11/16/2011 showed multiple polyps, hemorrhoids and diverticula. 12. Thrombocytopenia. Stable. 13. Mild leukopenia. Improved. 14. Mildly elevated LDH 11/13/2011. 15. Mildly elevated PT 11/13/2011. 16. 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 There was no evidence of metastatic carcinoma. Storage iron was decreased. Cytogenetic returned with a normal 46 XY karyotype. A molecular FISH panel was negative. 17. Right preauricular/temple skin lesion 12/28/2011. He reports recent evaluation by his dermatologist.  Disposition-Mr. Teffeteller's blood counts remain stable. He will decrease oral iron to 1 tablet daily. He will return for a CBC in 6 weeks and a followup visit in 12 weeks. He will contact the office in the interim with any problems. We specifically discussed spontaneous bruising/bleeding.  Plan reviewed with Dr. Truett Perna.  Lonna Cobb ANP/GNP-BC

## 2012-02-27 NOTE — Telephone Encounter (Signed)
Gave pt appt fo August lab only and September 2013 lab and MD

## 2012-03-11 DIAGNOSIS — H8110 Benign paroxysmal vertigo, unspecified ear: Secondary | ICD-10-CM | POA: Diagnosis not present

## 2012-03-11 DIAGNOSIS — J019 Acute sinusitis, unspecified: Secondary | ICD-10-CM | POA: Diagnosis not present

## 2012-03-20 DIAGNOSIS — H353290 Exudative age-related macular degeneration, unspecified eye, stage unspecified: Secondary | ICD-10-CM | POA: Diagnosis not present

## 2012-03-20 DIAGNOSIS — H35059 Retinal neovascularization, unspecified, unspecified eye: Secondary | ICD-10-CM | POA: Diagnosis not present

## 2012-03-20 DIAGNOSIS — H35359 Cystoid macular degeneration, unspecified eye: Secondary | ICD-10-CM | POA: Diagnosis not present

## 2012-04-09 ENCOUNTER — Other Ambulatory Visit (HOSPITAL_BASED_OUTPATIENT_CLINIC_OR_DEPARTMENT_OTHER): Payer: Medicare Other | Admitting: Lab

## 2012-04-09 DIAGNOSIS — Q068 Other specified congenital malformations of spinal cord: Secondary | ICD-10-CM

## 2012-04-09 LAB — CBC WITH DIFFERENTIAL/PLATELET
BASO%: 0.1 % (ref 0.0–2.0)
EOS%: 0.1 % (ref 0.0–7.0)
Eosinophils Absolute: 0 10*3/uL (ref 0.0–0.5)
HCT: 44.3 % (ref 38.4–49.9)
HGB: 15 g/dL (ref 13.0–17.1)
LYMPH%: 37.8 % (ref 14.0–49.0)
MCH: 30.5 pg (ref 27.2–33.4)
MCHC: 34 g/dL (ref 32.0–36.0)
MCV: 89.8 fL (ref 79.3–98.0)
MONO%: 29.5 % — ABNORMAL HIGH (ref 0.0–14.0)
Platelets: 45 10*3/uL — ABNORMAL LOW (ref 140–400)
RBC: 4.93 10*6/uL (ref 4.20–5.82)
RDW: 14.9 % — ABNORMAL HIGH (ref 11.0–14.6)
WBC: 3.4 10*3/uL — ABNORMAL LOW (ref 4.0–10.3)

## 2012-04-17 DIAGNOSIS — H9319 Tinnitus, unspecified ear: Secondary | ICD-10-CM | POA: Diagnosis not present

## 2012-04-17 DIAGNOSIS — H905 Unspecified sensorineural hearing loss: Secondary | ICD-10-CM | POA: Diagnosis not present

## 2012-04-17 DIAGNOSIS — J0100 Acute maxillary sinusitis, unspecified: Secondary | ICD-10-CM | POA: Diagnosis not present

## 2012-04-26 DIAGNOSIS — L905 Scar conditions and fibrosis of skin: Secondary | ICD-10-CM | POA: Diagnosis not present

## 2012-04-26 DIAGNOSIS — L219 Seborrheic dermatitis, unspecified: Secondary | ICD-10-CM | POA: Diagnosis not present

## 2012-04-30 DIAGNOSIS — J0100 Acute maxillary sinusitis, unspecified: Secondary | ICD-10-CM | POA: Diagnosis not present

## 2012-05-01 DIAGNOSIS — H35359 Cystoid macular degeneration, unspecified eye: Secondary | ICD-10-CM | POA: Diagnosis not present

## 2012-05-01 DIAGNOSIS — H353290 Exudative age-related macular degeneration, unspecified eye, stage unspecified: Secondary | ICD-10-CM | POA: Diagnosis not present

## 2012-05-01 DIAGNOSIS — H35059 Retinal neovascularization, unspecified, unspecified eye: Secondary | ICD-10-CM | POA: Diagnosis not present

## 2012-05-06 DIAGNOSIS — I10 Essential (primary) hypertension: Secondary | ICD-10-CM | POA: Diagnosis not present

## 2012-05-06 DIAGNOSIS — Z0000 Encounter for general adult medical examination without abnormal findings: Secondary | ICD-10-CM | POA: Diagnosis not present

## 2012-05-06 DIAGNOSIS — F411 Generalized anxiety disorder: Secondary | ICD-10-CM | POA: Diagnosis not present

## 2012-05-06 DIAGNOSIS — Z23 Encounter for immunization: Secondary | ICD-10-CM | POA: Diagnosis not present

## 2012-05-06 DIAGNOSIS — K219 Gastro-esophageal reflux disease without esophagitis: Secondary | ICD-10-CM | POA: Diagnosis not present

## 2012-05-21 ENCOUNTER — Telehealth: Payer: Self-pay | Admitting: Oncology

## 2012-05-21 ENCOUNTER — Ambulatory Visit (HOSPITAL_BASED_OUTPATIENT_CLINIC_OR_DEPARTMENT_OTHER): Payer: Medicare Other | Admitting: Oncology

## 2012-05-21 ENCOUNTER — Other Ambulatory Visit (HOSPITAL_BASED_OUTPATIENT_CLINIC_OR_DEPARTMENT_OTHER): Payer: Medicare Other | Admitting: Lab

## 2012-05-21 VITALS — BP 144/80 | HR 80 | Temp 96.8°F | Resp 20 | Ht 71.0 in | Wt 191.8 lb

## 2012-05-21 DIAGNOSIS — F411 Generalized anxiety disorder: Secondary | ICD-10-CM | POA: Diagnosis not present

## 2012-05-21 DIAGNOSIS — Q068 Other specified congenital malformations of spinal cord: Secondary | ICD-10-CM

## 2012-05-21 DIAGNOSIS — Z23 Encounter for immunization: Secondary | ICD-10-CM | POA: Diagnosis not present

## 2012-05-21 DIAGNOSIS — C182 Malignant neoplasm of ascending colon: Secondary | ICD-10-CM

## 2012-05-21 DIAGNOSIS — D4620 Refractory anemia with excess of blasts, unspecified: Secondary | ICD-10-CM | POA: Diagnosis not present

## 2012-05-21 LAB — CBC WITH DIFFERENTIAL/PLATELET
BASO%: 0.2 % (ref 0.0–2.0)
Eosinophils Absolute: 0 10*3/uL (ref 0.0–0.5)
LYMPH%: 30.2 % (ref 14.0–49.0)
MCHC: 34.1 g/dL (ref 32.0–36.0)
MONO#: 1.7 10*3/uL — ABNORMAL HIGH (ref 0.1–0.9)
NEUT#: 1.7 10*3/uL (ref 1.5–6.5)
RBC: 4.67 10*6/uL (ref 4.20–5.82)
RDW: 14.3 % (ref 11.0–14.6)
WBC: 4.8 10*3/uL (ref 4.0–10.3)
lymph#: 1.4 10*3/uL (ref 0.9–3.3)

## 2012-05-21 MED ORDER — PNEUMOCOCCAL VAC POLYVALENT 25 MCG/0.5ML IJ INJ
0.5000 mL | INJECTION | Freq: Once | INTRAMUSCULAR | Status: AC
  Administered 2012-05-21: 0.5 mL via INTRAMUSCULAR
  Filled 2012-05-21: qty 0.5

## 2012-05-21 NOTE — Telephone Encounter (Signed)
appts made and printed for pt aom °

## 2012-05-21 NOTE — Progress Notes (Signed)
   [facility]    OFFICE PROGRESS NOTE   INTERVAL HISTORY:   He returns as scheduled. He is now followed by Dr. Haroldine Laws with a sinus infection and right-sided hearing loss. He bruises easily. No other complaint.  Objective:  Vital signs in last 24 hours:  Blood pressure 144/80, pulse 80, temperature 96.8 F (36 C), temperature source Oral, resp. rate 20, height 5\' 11"  (1.803 m), weight 191 lb 12.8 oz (87 kg).    HEENT: Neck without mass Resp: Distant breath sounds, no respiratory distress Cardio: Regular rate and rhythm GI: No hepatosplenomegaly, no mass Vascular: Trace low pretibial edema on the right greater than left  Skin: Few ecchymoses over the arms. Lipoma at the right arm.    Lab Results:  Lab Results  Component Value Date   WBC 4.8 05/21/2012   HGB 14.4 05/21/2012   HCT 42.3 05/21/2012   MCV 90.7 05/21/2012   PLT 50* 05/21/2012   ANC 1.7   Medications: I have reviewed the patient's current medications.  Assessment/Plan: 1. Stage III colon cancer diagnosed in August 2008, status post adjuvant Xeloda chemotherapy, completed in April 2009. He underwent a colonoscopy 11/16/2011 with multiple polyps. 2. History of increased tearing, status post right tear duct stent placement.  3. History of multiple colonic polyps, status post a negative colonoscopy by Dr. Elnoria Howard in May 2011.  4. Anxiety disorder.  5. Multiple back surgeries.  6. Hypertension.  7. Gastroesophageal reflux disease, status post a Nissen fundoplication.  8. Macular degeneration, followed by Dr. Luciana Axe.  9. Right ear "tinnitus," and hearing loss followed by Dr. Haroldine Laws.  10. Severe microcytic anemia. Ferritin returned low at 7 on 11/13/2011. He was transfused 2 units of blood. He is taking ferrous sulfate 3 times daily. Bone marrow biopsy on 11/28/2011 confirmed decreased iron stores. Hemoglobin has normalized. No longer taking iron.  11. Hemoccult positive stool. He underwent an upper  endoscopy on 11/16/2011 with findings of moderate gastritis and question atypical duodenal AVMs. There was no evidence of active bleeding. Colonoscopy also on 11/16/2011 showed multiple polyps, hemorrhoids and diverticula. 12. Thrombocytopenia. Stable. 13. Mild leukopenia. Improved. 14. Mildly elevated LDH 11/13/2011. 15. Mildly elevated PT 11/13/2011. 16. 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 There was no evidence of metastatic carcinoma. Storage iron was decreased. Cytogenetic returned with a normal 46 XY karyotype. A molecular FISH panel was negative. 17. Right preauricular/temple skin lesion 12/28/2011. He reports being evaluated by his dermatologist.   Disposition:  He is stable from a hematologic standpoint. Mr. Whitesell no contact us for spontaneous bleeding or bruising. He will return for an office and lab visit in approximately 2-1/2 months.  He will continue followup with Dr. Haroldine Laws for evaluation of the hearing loss. He received a pneumococcal vaccine today. He has received a yearly influenza vaccine.  Thornton Papas, MD  05/21/2012  11:55 AM

## 2012-05-23 DIAGNOSIS — J0100 Acute maxillary sinusitis, unspecified: Secondary | ICD-10-CM | POA: Diagnosis not present

## 2012-06-12 DIAGNOSIS — H35359 Cystoid macular degeneration, unspecified eye: Secondary | ICD-10-CM | POA: Diagnosis not present

## 2012-06-12 DIAGNOSIS — H35059 Retinal neovascularization, unspecified, unspecified eye: Secondary | ICD-10-CM | POA: Diagnosis not present

## 2012-06-12 DIAGNOSIS — H353290 Exudative age-related macular degeneration, unspecified eye, stage unspecified: Secondary | ICD-10-CM | POA: Diagnosis not present

## 2012-08-05 DIAGNOSIS — H353290 Exudative age-related macular degeneration, unspecified eye, stage unspecified: Secondary | ICD-10-CM | POA: Diagnosis not present

## 2012-08-05 DIAGNOSIS — H35059 Retinal neovascularization, unspecified, unspecified eye: Secondary | ICD-10-CM | POA: Diagnosis not present

## 2012-08-06 ENCOUNTER — Ambulatory Visit (HOSPITAL_BASED_OUTPATIENT_CLINIC_OR_DEPARTMENT_OTHER): Payer: Medicare Other | Admitting: Nurse Practitioner

## 2012-08-06 ENCOUNTER — Telehealth: Payer: Self-pay | Admitting: Oncology

## 2012-08-06 ENCOUNTER — Other Ambulatory Visit (HOSPITAL_BASED_OUTPATIENT_CLINIC_OR_DEPARTMENT_OTHER): Payer: Medicare Other | Admitting: Lab

## 2012-08-06 VITALS — BP 155/80 | HR 80 | Temp 96.9°F | Resp 20 | Ht 71.0 in | Wt 194.4 lb

## 2012-08-06 DIAGNOSIS — D4620 Refractory anemia with excess of blasts, unspecified: Secondary | ICD-10-CM | POA: Diagnosis not present

## 2012-08-06 DIAGNOSIS — C189 Malignant neoplasm of colon, unspecified: Secondary | ICD-10-CM

## 2012-08-06 DIAGNOSIS — Z85038 Personal history of other malignant neoplasm of large intestine: Secondary | ICD-10-CM | POA: Diagnosis not present

## 2012-08-06 DIAGNOSIS — IMO0001 Reserved for inherently not codable concepts without codable children: Secondary | ICD-10-CM

## 2012-08-06 LAB — CBC WITH DIFFERENTIAL/PLATELET
BASO%: 0.2 % (ref 0.0–2.0)
Basophils Absolute: 0 10*3/uL (ref 0.0–0.1)
EOS%: 0.1 % (ref 0.0–7.0)
HGB: 13 g/dL (ref 13.0–17.1)
MCH: 29.6 pg (ref 27.2–33.4)
MCHC: 33.9 g/dL (ref 32.0–36.0)
MONO#: 1.7 10*3/uL — ABNORMAL HIGH (ref 0.1–0.9)
RDW: 15.9 % — ABNORMAL HIGH (ref 11.0–14.6)
WBC: 4.3 10*3/uL (ref 4.0–10.3)
lymph#: 1.2 10*3/uL (ref 0.9–3.3)

## 2012-08-06 NOTE — Progress Notes (Signed)
OFFICE PROGRESS NOTE  Interval history:  Mr. Derner returns as scheduled. He reports multiple "floaters". He had an eye "injection" yesterday. There is one remaining floater today.  No nausea or vomiting. Bowels the regularly. Last week he had 3 black stools. He denies any other bleeding. He reports a good appetite. He is gaining weight.   Objective: Blood pressure 155/80, pulse 80, temperature 96.9 F (36.1 C), temperature source Oral, resp. rate 20, height 5\' 11"  (1.803 m), weight 194 lb 6.4 oz (88.179 kg).  Oropharynx is without thrush or ulceration. No palpable cervical, subclavicular, axillary or inguinal lymph nodes. Lungs are clear. Regular cardiac rhythm. Abdomen is soft with mild generalized tenderness. No hepatomegaly. Trace lower leg edema bilaterally.  Lab Results: Lab Results  Component Value Date   WBC 4.3 08/06/2012   HGB 13.0 08/06/2012   HCT 38.2* 08/06/2012   MCV 87.3 08/06/2012   PLT 45* 08/06/2012    Chemistry:    Chemistry      Component Value Date/Time   NA 140 10/11/2007 1013   K 4.1 10/11/2007 1013   CL 106 10/11/2007 1013   CO2 25 10/11/2007 1013   BUN 14 10/11/2007 1013   CREATININE 1.14 10/11/2007 1013      Component Value Date/Time   CALCIUM 9.2 10/11/2007 1013   ALKPHOS 86 10/11/2007 1013   AST 12 10/11/2007 1013   ALT 9 10/11/2007 1013   BILITOT 0.8 10/11/2007 1013       Studies/Results: No results found.  Medications: I have reviewed the patient's current medications.  Assessment/Plan:  1. Stage III colon cancer diagnosed in August 2008, status post adjuvant Xeloda chemotherapy, completed in April 2009. He underwent a colonoscopy 11/16/2011 with multiple polyps. 2. History of increased tearing, status post right tear duct stent placement.  3. History of multiple colonic polyps, status post a negative colonoscopy by Dr. Elnoria Howard in May 2011.  4. Anxiety disorder.  5. Multiple back surgeries.  6. Hypertension.  7. Gastroesophageal reflux  disease, status post a Nissen fundoplication.  8. Macular degeneration, followed by Dr. Luciana Axe.  9. Right ear "tinnitus," and hearing loss followed by Dr. Haroldine Laws.  10. Severe microcytic anemia. Ferritin returned low at 7 on 11/13/2011. He was transfused 2 units of blood. He is taking ferrous sulfate 3 times daily. Bone marrow biopsy on 11/28/2011 confirmed decreased iron stores. Hemoglobin has normalized. No longer taking iron.  11. Hemoccult positive stool. He underwent an upper endoscopy on 11/16/2011 with findings of moderate gastritis and question atypical duodenal AVMs. There was no evidence of active bleeding. Colonoscopy also on 11/16/2011 showed multiple polyps, hemorrhoids and diverticula. 12. Thrombocytopenia. Stable. 13. Mild leukopenia. Improved. 14. Mildly elevated LDH 11/13/2011. 15. Mildly elevated PT 11/13/2011. 16. 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 There was no evidence of metastatic carcinoma. Storage iron was decreased. Cytogenetic returned with a normal 46 XY karyotype. A molecular FISH panel was negative. 17. Right preauricular/temple skin lesion 12/28/2011. He reports being evaluated by his dermatologist. 18. Recent black stools. He will contact Dr. Elnoria Howard with persistent black stools.   Disposition-Mr. Bieler remains stable from a hematologic standpoint. He will return for labs and an office visit in approximately 2 months. He will contact the office in the interim with any problems. We specifically discussed spontaneous bleeding or bruising.  Plan reviewed with Dr. Truett Perna.  Lonna Cobb ANP/GNP-BC

## 2012-08-06 NOTE — Telephone Encounter (Signed)
gv pt appt schedule for February 2014. °

## 2012-09-16 DIAGNOSIS — H353290 Exudative age-related macular degeneration, unspecified eye, stage unspecified: Secondary | ICD-10-CM | POA: Diagnosis not present

## 2012-09-16 DIAGNOSIS — H35059 Retinal neovascularization, unspecified, unspecified eye: Secondary | ICD-10-CM | POA: Diagnosis not present

## 2012-10-08 ENCOUNTER — Ambulatory Visit (HOSPITAL_BASED_OUTPATIENT_CLINIC_OR_DEPARTMENT_OTHER): Payer: Medicare Other | Admitting: Oncology

## 2012-10-08 ENCOUNTER — Telehealth: Payer: Self-pay | Admitting: Oncology

## 2012-10-08 ENCOUNTER — Other Ambulatory Visit (HOSPITAL_BASED_OUTPATIENT_CLINIC_OR_DEPARTMENT_OTHER): Payer: Medicare Other

## 2012-10-08 VITALS — BP 134/83 | HR 85 | Temp 96.6°F | Resp 20 | Ht 71.0 in | Wt 193.3 lb

## 2012-10-08 DIAGNOSIS — Q068 Other specified congenital malformations of spinal cord: Secondary | ICD-10-CM | POA: Diagnosis not present

## 2012-10-08 DIAGNOSIS — Z85038 Personal history of other malignant neoplasm of large intestine: Secondary | ICD-10-CM

## 2012-10-08 DIAGNOSIS — C182 Malignant neoplasm of ascending colon: Secondary | ICD-10-CM | POA: Diagnosis not present

## 2012-10-08 DIAGNOSIS — C189 Malignant neoplasm of colon, unspecified: Secondary | ICD-10-CM

## 2012-10-08 LAB — CBC WITH DIFFERENTIAL/PLATELET
Basophils Absolute: 0 10*3/uL (ref 0.0–0.1)
Eosinophils Absolute: 0 10*3/uL (ref 0.0–0.5)
HCT: 39.3 % (ref 38.4–49.9)
HGB: 13.1 g/dL (ref 13.0–17.1)
NEUT#: 1.1 10*3/uL — ABNORMAL LOW (ref 1.5–6.5)
RDW: 16.4 % — ABNORMAL HIGH (ref 11.0–14.6)
lymph#: 1.3 10*3/uL (ref 0.9–3.3)

## 2012-10-08 NOTE — Progress Notes (Signed)
   [facility]    OFFICE PROGRESS NOTE   INTERVAL HISTORY:   He returns as scheduled. No bleeding. He complains of "soreness "over the ribs bilaterally.   Objective:  Vital signs in last 24 hours:  Blood pressure 134/83, pulse 85, temperature 96.6 F (35.9 C), temperature source Oral, resp. rate 20, height 5\' 11"  (1.803 m), weight 193 lb 4.8 oz (87.68 kg).    HEENT: Neck without mass Lymphatics: No cervical, supraclavicular, axillary, or inguinal nodes Resp: Lungs clear bilaterally Cardio: Regular rate and rhythm GI: No hepatosplenomegaly, nontender, no mass Vascular: Trace to 1+ pitting edema at the left greater than right lower leg   Lab Results:  Lab Results  Component Value Date   WBC 3.5* 10/08/2012   HGB 13.1 10/08/2012   HCT 39.3 10/08/2012   MCV 84.1 10/08/2012   PLT 51* 10/08/2012   ANC 1.1    Medications: I have reviewed the patient's current medications.  Assessment/Plan: 1. Stage III colon cancer diagnosed in August 2008, status post adjuvant Xeloda chemotherapy, completed in April 2009. He underwent a colonoscopy 11/16/2011 with multiple polyps. 2. History of increased tearing, status post right tear duct stent placement.  3. History of multiple colonic polyps, status post a negative colonoscopy by Dr. Elnoria Howard in May 2011.  4. Anxiety disorder.  5. Multiple back surgeries.  6. Hypertension.  7. Gastroesophageal reflux disease, status post a Nissen fundoplication.  8. Macular degeneration, followed by Dr. Luciana Axe.  9. Right ear "tinnitus," and hearing loss followed by Dr. Haroldine Laws.  10. Severe microcytic anemia. Ferritin returned low at 7 on 11/13/2011. He was transfused 2 units of blood. He is taking ferrous sulfate 3 times daily. Bone marrow biopsy on 11/28/2011 confirmed decreased iron stores. Hemoglobin has normalized. No longer taking iron.  11. Hemoccult positive stool. He underwent an upper endoscopy on 11/16/2011 with findings of moderate  gastritis and question atypical duodenal AVMs. There was no evidence of active bleeding. Colonoscopy also on 11/16/2011 showed multiple polyps, hemorrhoids and diverticula. 12. Thrombocytopenia. Stable. 13. Mild leukopenia. Improved. 14. Mildly elevated LDH 11/13/2011. 15. Mildly elevated PT 11/13/2011. 16. 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 There was no evidence of metastatic carcinoma. Storage iron was decreased. Cytogenetic returned with a normal 46 XY karyotype. A molecular FISH panel was negative  Disposition:  Mr. Hardman remains in clinical remission from colon cancer. We will followup on the CEA from today. He is stable from a hematologic standpoint. He will return for an office visit and CBC in 3 months. He continues colonoscopy followup with Dr. Elnoria Howard.   Thornton Papas, MD  10/08/2012  1:58 PM

## 2012-10-08 NOTE — Telephone Encounter (Signed)
gv and printed appt schedule for pt for May....lvm for [name] at [facility] to call p

## 2012-10-10 ENCOUNTER — Telehealth: Payer: Self-pay | Admitting: *Deleted

## 2012-10-10 NOTE — Telephone Encounter (Signed)
Notified patient of CEA results. Asking about referral to Dr. Bonita Quin not heard anything yet. Instructed him to call next week if he has not heard anything; needs to give them time to process the referral.

## 2012-10-10 NOTE — Telephone Encounter (Signed)
Message copied by Wandalee Ferdinand on Thu Oct 10, 2012  4:05 PM ------      Message from: [name]      Created: Wed Oct 09, 2012  9:02 PM       Please call patient, cea is normal ------

## 2012-10-15 ENCOUNTER — Telehealth: Payer: Self-pay | Admitting: *Deleted

## 2012-10-15 NOTE — Telephone Encounter (Signed)
Call from pt's sister in law to follow up on referral to primary care MD. Left message with scheduler to follow up and call pt.

## 2012-10-21 DIAGNOSIS — H9190 Unspecified hearing loss, unspecified ear: Secondary | ICD-10-CM | POA: Diagnosis not present

## 2012-10-21 DIAGNOSIS — D696 Thrombocytopenia, unspecified: Secondary | ICD-10-CM | POA: Diagnosis not present

## 2012-10-21 DIAGNOSIS — R05 Cough: Secondary | ICD-10-CM | POA: Diagnosis not present

## 2012-10-21 DIAGNOSIS — D509 Iron deficiency anemia, unspecified: Secondary | ICD-10-CM | POA: Diagnosis not present

## 2012-10-23 ENCOUNTER — Telehealth: Payer: Self-pay | Admitting: *Deleted

## 2012-10-23 NOTE — Telephone Encounter (Signed)
Left VM requesting sister-in-law to let us know if the referral for IM has occurred yet?

## 2012-10-24 DIAGNOSIS — D485 Neoplasm of uncertain behavior of skin: Secondary | ICD-10-CM | POA: Diagnosis not present

## 2012-11-01 DIAGNOSIS — H353290 Exudative age-related macular degeneration, unspecified eye, stage unspecified: Secondary | ICD-10-CM | POA: Diagnosis not present

## 2012-11-01 DIAGNOSIS — H35359 Cystoid macular degeneration, unspecified eye: Secondary | ICD-10-CM | POA: Diagnosis not present

## 2012-11-01 DIAGNOSIS — H35059 Retinal neovascularization, unspecified, unspecified eye: Secondary | ICD-10-CM | POA: Diagnosis not present

## 2012-12-09 DIAGNOSIS — Z1331 Encounter for screening for depression: Secondary | ICD-10-CM | POA: Diagnosis not present

## 2012-12-09 DIAGNOSIS — I10 Essential (primary) hypertension: Secondary | ICD-10-CM | POA: Diagnosis not present

## 2012-12-09 DIAGNOSIS — J3489 Other specified disorders of nose and nasal sinuses: Secondary | ICD-10-CM | POA: Diagnosis not present

## 2012-12-19 DIAGNOSIS — L570 Actinic keratosis: Secondary | ICD-10-CM | POA: Diagnosis not present

## 2012-12-19 DIAGNOSIS — B078 Other viral warts: Secondary | ICD-10-CM | POA: Diagnosis not present

## 2012-12-19 DIAGNOSIS — D485 Neoplasm of uncertain behavior of skin: Secondary | ICD-10-CM | POA: Diagnosis not present

## 2013-01-02 ENCOUNTER — Telehealth: Payer: Self-pay | Admitting: Oncology

## 2013-01-02 ENCOUNTER — Other Ambulatory Visit (HOSPITAL_BASED_OUTPATIENT_CLINIC_OR_DEPARTMENT_OTHER): Payer: Medicare Other | Admitting: Lab

## 2013-01-02 ENCOUNTER — Ambulatory Visit (HOSPITAL_BASED_OUTPATIENT_CLINIC_OR_DEPARTMENT_OTHER): Payer: Medicare Other | Admitting: Nurse Practitioner

## 2013-01-02 VITALS — BP 139/68 | HR 93 | Temp 96.0°F | Resp 19 | Ht 71.0 in | Wt 193.7 lb

## 2013-01-02 DIAGNOSIS — Q068 Other specified congenital malformations of spinal cord: Secondary | ICD-10-CM | POA: Diagnosis not present

## 2013-01-02 DIAGNOSIS — C189 Malignant neoplasm of colon, unspecified: Secondary | ICD-10-CM

## 2013-01-02 LAB — CBC WITH DIFFERENTIAL/PLATELET
Basophils Absolute: 0 10*3/uL (ref 0.0–0.1)
Eosinophils Absolute: 0 10*3/uL (ref 0.0–0.5)
HCT: 35.1 % — ABNORMAL LOW (ref 38.4–49.9)
HGB: 11.7 g/dL — ABNORMAL LOW (ref 13.0–17.1)
MCH: 26.8 pg — ABNORMAL LOW (ref 27.2–33.4)
MONO#: 1.4 10*3/uL — ABNORMAL HIGH (ref 0.1–0.9)
NEUT#: 1.3 10*3/uL — ABNORMAL LOW (ref 1.5–6.5)
NEUT%: 31.9 % — ABNORMAL LOW (ref 39.0–75.0)
RDW: 18.2 % — ABNORMAL HIGH (ref 11.0–14.6)
WBC: 4 10*3/uL (ref 4.0–10.3)
lymph#: 1.3 10*3/uL (ref 0.9–3.3)

## 2013-01-02 NOTE — Progress Notes (Signed)
OFFICE PROGRESS NOTE  Interval history:  [name] returns as scheduled. No interim illnesses or infections. He denies bleeding. He continues to note bruising over the forearms. Bowels moving regularly. He has occasional abdominal pain. He has recently had pain at the lower left breast. He describes the pain as sharp. It occurs intermittently.   Objective: Blood pressure 139/68, pulse 93, temperature 96 F (35.6 C), temperature source Oral, resp. rate 19, height 5\' 11"  (1.803 m), weight 193 lb 11.2 oz (87.862 kg).  Brown coating over tongue. No palpable cervical, supraclavicular, axillary or inguinal lymph nodes. Lungs are clear. Regular cardiac rhythm. Abdomen is soft and nontender. No hepatomegaly. No mass. Extremities without edema. No mass palpated left breast. The left breast has a slightly full appearance as compared to the right. No skin rash.   Lab Results: Lab Results  Component Value Date   WBC 4.0 01/02/2013   HGB 11.7* 01/02/2013   HCT 35.1* 01/02/2013   MCV 80.7 01/02/2013   PLT 59* 01/02/2013    Chemistry:    Chemistry      Component Value Date/Time   NA 140 10/11/2007 1013   K 4.1 10/11/2007 1013   CL 106 10/11/2007 1013   CO2 25 10/11/2007 1013   BUN 14 10/11/2007 1013   CREATININE 1.14 10/11/2007 1013      Component Value Date/Time   CALCIUM 9.2 10/11/2007 1013   ALKPHOS 86 10/11/2007 1013   AST 12 10/11/2007 1013   ALT 9 10/11/2007 1013   BILITOT 0.8 10/11/2007 1013       Studies/Results: No results found.  Medications: I have reviewed the patient's current medications.  Assessment/Plan:  1. Stage III colon cancer diagnosed in August 2008, status post adjuvant Xeloda chemotherapy, completed in April 2009. He underwent a colonoscopy 11/16/2011 with multiple polyps. 2. History of increased tearing, status post right tear duct stent placement.  3. History of multiple colonic polyps, status post a negative colonoscopy by Dr. Elnoria Howard in May 2011.  4. Anxiety disorder.   5. Multiple back surgeries.  6. Hypertension.  7. Gastroesophageal reflux disease, status post a Nissen fundoplication.  8. Macular degeneration, followed by Dr. Luciana Axe.  9. Right ear "tinnitus," and hearing loss followed by Dr. Haroldine Laws.  10. Severe microcytic anemia. Ferritin returned low at 7 on 11/13/2011. He was transfused 2 units of blood. He is taking ferrous sulfate 3 times daily. Bone marrow biopsy on 11/28/2011 confirmed decreased iron stores. The hemoglobin normalized. Oral iron was discontinued. He is mildly anemic on labs today. The MCV has decreased. 11. Hemoccult positive stool. He underwent an upper endoscopy on 11/16/2011 with findings of moderate gastritis and question atypical duodenal AVMs. There was no evidence of active bleeding. Colonoscopy also on 11/16/2011 showed multiple polyps, hemorrhoids and diverticula. 12. Thrombocytopenia. Stable. 13. Mild leukopenia. Stable. 14. Mildly elevated LDH 11/13/2011. 15. Mildly elevated PT 11/13/2011. 16. 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 There was no evidence of metastatic carcinoma. Storage iron was decreased. Cytogenetic returned with a normal 46 XY karyotype. A molecular FISH panel was negative 17. Intermittent pain left breast. The left breast has a slightly full appearance. Question gynecomastia. He will contact the office with persistent pain or any changes in the breast.  Disposition-Mr. Gergen appears stable. He remains in clinical remission from the colon cancer. He is mildly anemic on labs today and the MCV has decreased. He  will resume oral iron twice daily. He will return for repeat labs in 2 months and a followup visit in 4 months. He will contact the office in the interim as outlined above or with any other problems.  Plan reviewed with Dr. Truett Perna.  Lonna Cobb ANP/GNP-BC

## 2013-01-08 DIAGNOSIS — H35359 Cystoid macular degeneration, unspecified eye: Secondary | ICD-10-CM | POA: Diagnosis not present

## 2013-01-08 DIAGNOSIS — H35059 Retinal neovascularization, unspecified, unspecified eye: Secondary | ICD-10-CM | POA: Diagnosis not present

## 2013-01-08 DIAGNOSIS — H353290 Exudative age-related macular degeneration, unspecified eye, stage unspecified: Secondary | ICD-10-CM | POA: Diagnosis not present

## 2013-01-12 IMAGING — CT CT BIOPSY
1 series · 1 of 24 positions shown · non-contrast
Comparison: none

INDICATION: Pancytopenia

[Series 2: localizer · axial · 5.0mm · 0.74mm/px · 1 of 24 slices shown]
[im 13/24]
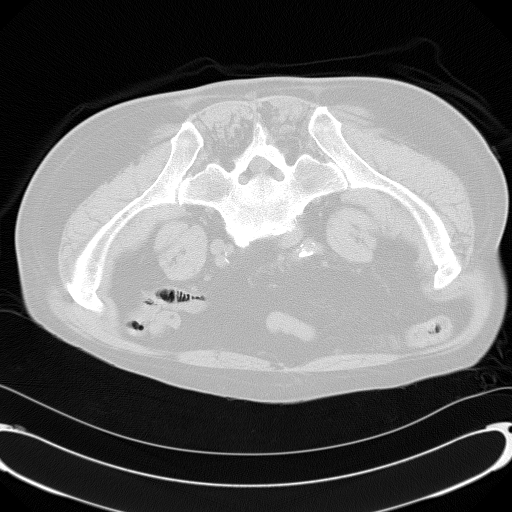

[1 of 24 positions shown; findings below may reference images not displayed]

CT GUIDED RIGHT ILIAC BONE MARROW ASPIRATION AND BONE MARROW CORE
BIOPSIES

Medications: Fentanyl 150 mcg IV; Versed 2 mg IV

Sedation time: 15 minutes

Contrast volume: None

Complications: None immediate

PROCEDURE/FINDINGS:

Informed consent was obtained from the patient following an
explanation of the procedure, risks, benefits and alternatives.
The patient understands, agrees and consents for the procedure.
All questions were addressed.  A time out was performed prior to
the initiation of the procedure.  The patient was positioned prone
and noncontrast localization CT was performed of the pelvis to
demonstrate the iliac marrow spaces.  The operative site was
prepped and draped in the usual sterile fashion.

Under sterile conditions and local anesthesia, an 11 gauge coaxial
bone biopsy needle was advanced into the right iliac marrow space.
Needle position was confirmed with CT imaging. Initially, bone
marrow aspiration was performed. This was followed by the
acquisition of a bone marrow biopsy with the 11 gauge needle.  The
11 gauge coaxial bone biopsy needle was re-advanced into the right
iliac marrow space.  Positioning was again confirmed with CT and a
final bone marrow biopsy was obtained with the 11 gauge bone marrow
biopsy device.  Samples were prepared with the cytotechnologist.
The needle was removed intact.  Hemostasis was obtained with
compression and a dressing was placed. The patient tolerated the
procedure well without immediate post procedural complication.
IMPRESSION: Successful CT guided right iliac bone marrow aspiration and core
biopsies.

## 2013-01-22 DIAGNOSIS — D485 Neoplasm of uncertain behavior of skin: Secondary | ICD-10-CM | POA: Diagnosis not present

## 2013-01-22 DIAGNOSIS — D235 Other benign neoplasm of skin of trunk: Secondary | ICD-10-CM | POA: Diagnosis not present

## 2013-01-22 DIAGNOSIS — L280 Lichen simplex chronicus: Secondary | ICD-10-CM | POA: Diagnosis not present

## 2013-01-22 DIAGNOSIS — L739 Follicular disorder, unspecified: Secondary | ICD-10-CM | POA: Diagnosis not present

## 2013-01-22 DIAGNOSIS — K130 Diseases of lips: Secondary | ICD-10-CM | POA: Diagnosis not present

## 2013-02-27 ENCOUNTER — Other Ambulatory Visit (HOSPITAL_BASED_OUTPATIENT_CLINIC_OR_DEPARTMENT_OTHER): Payer: Medicare Other

## 2013-02-27 DIAGNOSIS — Q068 Other specified congenital malformations of spinal cord: Secondary | ICD-10-CM | POA: Diagnosis not present

## 2013-02-27 LAB — CBC WITH DIFFERENTIAL/PLATELET
Basophils Absolute: 0 10*3/uL (ref 0.0–0.1)
EOS%: 0.1 % (ref 0.0–7.0)
Eosinophils Absolute: 0 10*3/uL (ref 0.0–0.5)
HCT: 40.9 % (ref 38.4–49.9)
HGB: 13.6 g/dL (ref 13.0–17.1)
MCH: 28.5 pg (ref 27.2–33.4)
MCV: 85.4 fL (ref 79.3–98.0)
MONO%: 42.6 % — ABNORMAL HIGH (ref 0.0–14.0)
NEUT#: 1.6 10*3/uL (ref 1.5–6.5)
NEUT%: 33.5 % — ABNORMAL LOW (ref 39.0–75.0)
Platelets: 62 10*3/uL — ABNORMAL LOW (ref 140–400)

## 2013-03-05 DIAGNOSIS — H353290 Exudative age-related macular degeneration, unspecified eye, stage unspecified: Secondary | ICD-10-CM | POA: Diagnosis not present

## 2013-03-05 DIAGNOSIS — H3570 Unspecified separation of retinal layers: Secondary | ICD-10-CM | POA: Diagnosis not present

## 2013-03-12 DIAGNOSIS — I10 Essential (primary) hypertension: Secondary | ICD-10-CM | POA: Diagnosis not present

## 2013-03-12 DIAGNOSIS — H8110 Benign paroxysmal vertigo, unspecified ear: Secondary | ICD-10-CM | POA: Diagnosis not present

## 2013-03-12 DIAGNOSIS — Z6827 Body mass index (BMI) 27.0-27.9, adult: Secondary | ICD-10-CM | POA: Diagnosis not present

## 2013-03-12 DIAGNOSIS — J3489 Other specified disorders of nose and nasal sinuses: Secondary | ICD-10-CM | POA: Diagnosis not present

## 2013-03-19 DIAGNOSIS — M25529 Pain in unspecified elbow: Secondary | ICD-10-CM | POA: Diagnosis not present

## 2013-03-19 DIAGNOSIS — R5383 Other fatigue: Secondary | ICD-10-CM | POA: Diagnosis not present

## 2013-03-19 DIAGNOSIS — Z6827 Body mass index (BMI) 27.0-27.9, adult: Secondary | ICD-10-CM | POA: Diagnosis not present

## 2013-04-24 ENCOUNTER — Other Ambulatory Visit (HOSPITAL_BASED_OUTPATIENT_CLINIC_OR_DEPARTMENT_OTHER): Payer: Medicare Other | Admitting: Lab

## 2013-04-24 ENCOUNTER — Telehealth: Payer: Self-pay | Admitting: Oncology

## 2013-04-24 ENCOUNTER — Ambulatory Visit (HOSPITAL_BASED_OUTPATIENT_CLINIC_OR_DEPARTMENT_OTHER): Payer: Medicare Other | Admitting: Oncology

## 2013-04-24 VITALS — BP 154/60 | HR 67 | Temp 98.3°F | Resp 18 | Ht 71.0 in | Wt 195.1 lb

## 2013-04-24 DIAGNOSIS — F411 Generalized anxiety disorder: Secondary | ICD-10-CM | POA: Diagnosis not present

## 2013-04-24 DIAGNOSIS — D696 Thrombocytopenia, unspecified: Secondary | ICD-10-CM | POA: Diagnosis not present

## 2013-04-24 DIAGNOSIS — C182 Malignant neoplasm of ascending colon: Secondary | ICD-10-CM

## 2013-04-24 DIAGNOSIS — D4620 Refractory anemia with excess of blasts, unspecified: Secondary | ICD-10-CM

## 2013-04-24 DIAGNOSIS — I10 Essential (primary) hypertension: Secondary | ICD-10-CM

## 2013-04-24 DIAGNOSIS — D72819 Decreased white blood cell count, unspecified: Secondary | ICD-10-CM | POA: Diagnosis not present

## 2013-04-24 DIAGNOSIS — C189 Malignant neoplasm of colon, unspecified: Secondary | ICD-10-CM | POA: Diagnosis not present

## 2013-04-24 DIAGNOSIS — D509 Iron deficiency anemia, unspecified: Secondary | ICD-10-CM | POA: Diagnosis not present

## 2013-04-24 LAB — CBC WITH DIFFERENTIAL/PLATELET
Basophils Absolute: 0 10*3/uL (ref 0.0–0.1)
EOS%: 0.3 % (ref 0.0–7.0)
Eosinophils Absolute: 0 10*3/uL (ref 0.0–0.5)
LYMPH%: 30.7 % (ref 14.0–49.0)
MCH: 29.7 pg (ref 27.2–33.4)
MCV: 85.2 fL (ref 79.3–98.0)
MONO%: 37.5 % — ABNORMAL HIGH (ref 0.0–14.0)
NEUT#: 1.2 10*3/uL — ABNORMAL LOW (ref 1.5–6.5)
Platelets: 58 10*3/uL — ABNORMAL LOW (ref 140–400)
RBC: 4.81 10*6/uL (ref 4.20–5.82)
RDW: 14.2 % (ref 11.0–14.6)

## 2013-04-24 NOTE — Telephone Encounter (Signed)
Gave pt appt for lab and MD for December 2014 °

## 2013-04-24 NOTE — Progress Notes (Signed)
   [facility]    OFFICE PROGRESS NOTE   INTERVAL HISTORY:   He returns as scheduled. He feels well. He fell in the bathtub several days ago and has soreness over the legs. He reports mild left-sided nosebleeds recently. He is taking iron.  Objective:  Vital signs in last 24 hours:  Blood pressure 154/60, pulse 67, temperature 98.3 F (36.8 C), temperature source Oral, resp. rate 18, height 5\' 11"  (1.803 m), weight 195 lb 1.6 oz (88.497 kg).    HEENT: No visible blood at the left nostril. Neck without mass Lymphatics: No cervical, supra-clavicular, axillary, or inguinal nodes Resp: Lungs clear bilaterally Cardio: Regular rate and rhythm GI: No hepatosplenomegaly, nontender, no mass Vascular: No leg edema Skin: Few areas of purpura over the dorsum of the forearms , lipomas over the arms    Lab Results:  Lab Results  Component Value Date   WBC 3.9* 04/24/2013   HGB 14.3 04/24/2013   HCT 41.0 04/24/2013   MCV 85.2 04/24/2013   PLT 58* 04/24/2013      Medications: I have reviewed the patient's current medications.  Assessment/plan:  1. Stage III colon cancer diagnosed in August 2008, status post adjuvant Xeloda chemotherapy, completed in April 2009. He underwent a colonoscopy 11/16/2011 with multiple polyps. 2. History of increased tearing, status post right tear duct stent placement.  3. History of multiple colonic polyps, status post a negative colonoscopy by Dr. Elnoria Howard in May 2011.  4. Anxiety disorder.  5. Multiple back surgeries.  6. Hypertension.  7. Gastroesophageal reflux disease, status post a Nissen fundoplication.  8. Macular degeneration, followed by Dr. Luciana Axe.  9. Right ear "tinnitus," and hearing loss followed by Dr. Haroldine Laws.  10. Severe microcytic anemia. Ferritin returned low at 7 on 11/13/2011. He was transfused 2 units of blood.times. Bone marrow biopsy on 11/28/2011 confirmed decreased iron stores. The hemoglobin normalized.  11. Hemoccult  positive stool. He underwent an upper endoscopy on 11/16/2011 with findings of moderate gastritis and question atypical duodenal AVMs. There was no evidence of active bleeding. Colonoscopy also on 11/16/2011 showed multiple polyps, hemorrhoids and diverticula. 12. Thrombocytopenia. Stable. 13. Mild leukopenia. Stable. 14. Mildly elevated LDH 11/13/2011. 15. Mildly elevated PT 11/13/2011. 16. 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 There was no evidence of metastatic carcinoma. Storage iron was decreased. Cytogenetic returned with a normal 46 XY karyotype. A molecular FISH panel was negative  Disposition:  Mr. Serena remains in clinical remission from colon cancer. The anemia has improved since iron was restarted in May of this year. He will continue ferrous sulfate twice daily. He will return for an office visit and CBC in 3 months. He will contact us for consistent nose bleeding.   Thornton Papas, MD  04/24/2013  4:42 PM

## 2013-04-25 LAB — CEA: CEA: 2 ng/mL (ref 0.0–5.0)

## 2013-04-30 DIAGNOSIS — H353290 Exudative age-related macular degeneration, unspecified eye, stage unspecified: Secondary | ICD-10-CM | POA: Diagnosis not present

## 2013-04-30 DIAGNOSIS — H3570 Unspecified separation of retinal layers: Secondary | ICD-10-CM | POA: Diagnosis not present

## 2013-04-30 DIAGNOSIS — H35059 Retinal neovascularization, unspecified, unspecified eye: Secondary | ICD-10-CM | POA: Diagnosis not present

## 2013-05-20 ENCOUNTER — Other Ambulatory Visit: Payer: Self-pay | Admitting: Dermatology

## 2013-05-20 DIAGNOSIS — L988 Other specified disorders of the skin and subcutaneous tissue: Secondary | ICD-10-CM | POA: Diagnosis not present

## 2013-05-20 DIAGNOSIS — K130 Diseases of lips: Secondary | ICD-10-CM | POA: Diagnosis not present

## 2013-05-20 DIAGNOSIS — L280 Lichen simplex chronicus: Secondary | ICD-10-CM | POA: Diagnosis not present

## 2013-06-02 DIAGNOSIS — D649 Anemia, unspecified: Secondary | ICD-10-CM | POA: Diagnosis not present

## 2013-06-02 DIAGNOSIS — Z125 Encounter for screening for malignant neoplasm of prostate: Secondary | ICD-10-CM | POA: Diagnosis not present

## 2013-06-02 DIAGNOSIS — I10 Essential (primary) hypertension: Secondary | ICD-10-CM | POA: Diagnosis not present

## 2013-06-06 DIAGNOSIS — Z23 Encounter for immunization: Secondary | ICD-10-CM | POA: Diagnosis not present

## 2013-06-09 DIAGNOSIS — R5381 Other malaise: Secondary | ICD-10-CM | POA: Diagnosis not present

## 2013-06-09 DIAGNOSIS — Z125 Encounter for screening for malignant neoplasm of prostate: Secondary | ICD-10-CM | POA: Diagnosis not present

## 2013-06-09 DIAGNOSIS — I10 Essential (primary) hypertension: Secondary | ICD-10-CM | POA: Diagnosis not present

## 2013-06-09 DIAGNOSIS — H3530 Unspecified macular degeneration: Secondary | ICD-10-CM | POA: Diagnosis not present

## 2013-06-09 DIAGNOSIS — Z1212 Encounter for screening for malignant neoplasm of rectum: Secondary | ICD-10-CM | POA: Diagnosis not present

## 2013-06-09 DIAGNOSIS — C189 Malignant neoplasm of colon, unspecified: Secondary | ICD-10-CM | POA: Diagnosis not present

## 2013-06-09 DIAGNOSIS — D509 Iron deficiency anemia, unspecified: Secondary | ICD-10-CM | POA: Diagnosis not present

## 2013-06-09 DIAGNOSIS — D696 Thrombocytopenia, unspecified: Secondary | ICD-10-CM | POA: Diagnosis not present

## 2013-06-09 DIAGNOSIS — Z0000 Encounter for general adult medical examination without abnormal findings: Secondary | ICD-10-CM | POA: Diagnosis not present

## 2013-06-18 DIAGNOSIS — K648 Other hemorrhoids: Secondary | ICD-10-CM | POA: Diagnosis not present

## 2013-06-18 DIAGNOSIS — R195 Other fecal abnormalities: Secondary | ICD-10-CM | POA: Diagnosis not present

## 2013-07-01 DIAGNOSIS — L280 Lichen simplex chronicus: Secondary | ICD-10-CM | POA: Diagnosis not present

## 2013-07-01 DIAGNOSIS — B354 Tinea corporis: Secondary | ICD-10-CM | POA: Diagnosis not present

## 2013-07-02 DIAGNOSIS — H353290 Exudative age-related macular degeneration, unspecified eye, stage unspecified: Secondary | ICD-10-CM | POA: Diagnosis not present

## 2013-07-02 DIAGNOSIS — H35059 Retinal neovascularization, unspecified, unspecified eye: Secondary | ICD-10-CM | POA: Diagnosis not present

## 2013-07-09 DIAGNOSIS — K648 Other hemorrhoids: Secondary | ICD-10-CM | POA: Diagnosis not present

## 2013-07-21 DIAGNOSIS — K648 Other hemorrhoids: Secondary | ICD-10-CM | POA: Diagnosis not present

## 2013-07-24 ENCOUNTER — Ambulatory Visit (HOSPITAL_BASED_OUTPATIENT_CLINIC_OR_DEPARTMENT_OTHER): Payer: Medicare Other | Admitting: Nurse Practitioner

## 2013-07-24 ENCOUNTER — Telehealth: Payer: Self-pay | Admitting: Oncology

## 2013-07-24 ENCOUNTER — Other Ambulatory Visit (HOSPITAL_BASED_OUTPATIENT_CLINIC_OR_DEPARTMENT_OTHER): Payer: Medicare Other | Admitting: Lab

## 2013-07-24 VITALS — BP 130/70 | HR 89 | Temp 97.7°F | Resp 20 | Ht 71.0 in | Wt 193.7 lb

## 2013-07-24 DIAGNOSIS — D4620 Refractory anemia with excess of blasts, unspecified: Secondary | ICD-10-CM | POA: Diagnosis not present

## 2013-07-24 DIAGNOSIS — Z85038 Personal history of other malignant neoplasm of large intestine: Secondary | ICD-10-CM | POA: Diagnosis not present

## 2013-07-24 DIAGNOSIS — D696 Thrombocytopenia, unspecified: Secondary | ICD-10-CM | POA: Diagnosis not present

## 2013-07-24 DIAGNOSIS — C189 Malignant neoplasm of colon, unspecified: Secondary | ICD-10-CM

## 2013-07-24 LAB — CBC WITH DIFFERENTIAL/PLATELET
BASO%: 0 % (ref 0.0–2.0)
EOS%: 0.2 % (ref 0.0–7.0)
HCT: 41.1 % (ref 38.4–49.9)
LYMPH%: 18.6 % (ref 14.0–49.0)
MCH: 29.6 pg (ref 27.2–33.4)
MCHC: 33.8 g/dL (ref 32.0–36.0)
NEUT%: 40.1 % (ref 39.0–75.0)
Platelets: 66 10*3/uL — ABNORMAL LOW (ref 140–400)
RBC: 4.69 10*6/uL (ref 4.20–5.82)

## 2013-07-24 NOTE — Telephone Encounter (Signed)
Gave pt appt for lab and MD  for April 2015 °

## 2013-07-24 NOTE — Progress Notes (Signed)
OFFICE PROGRESS NOTE  Interval history:  [name] returns as scheduled. Several days ago he "fell out of the bed" trying to turn on a lamp. He notes generalized sore muscles which he attributes to multiple attempts at trying to get up independently. He reports undergoing a recent hemorrhoid banding procedure. He had an episode of diarrhea this morning. Otherwise there has been no change in bowel habits. He denies abdominal pain. No shortness of breath. He continues oral iron.   Objective: Blood pressure 130/70, pulse 89, temperature 97.7 F (36.5 C), temperature source Oral, resp. rate 20, height 5\' 11"  (1.803 m), weight 193 lb 11.2 oz (87.862 kg).  No thrush or ulcerations. No palpable cervical, supraclavicular, axillary or inguinal lymph nodes. Lungs are clear. Regular cardiac rhythm. Abdomen soft and nontender. No organomegaly. No mass. No leg edema. Ecchymoses scattered over the dorsal aspect of the hands and forearms.  Lab Results: Lab Results  Component Value Date   WBC 4.3 07/24/2013   HGB 13.9 07/24/2013   HCT 41.1 07/24/2013   MCV 87.6 07/24/2013   PLT 66* 07/24/2013    Chemistry:    Chemistry      Component Value Date/Time   NA 140 10/11/2007 1013   K 4.1 10/11/2007 1013   CL 106 10/11/2007 1013   CO2 25 10/11/2007 1013   BUN 14 10/11/2007 1013   CREATININE 1.14 10/11/2007 1013      Component Value Date/Time   CALCIUM 9.2 10/11/2007 1013   ALKPHOS 86 10/11/2007 1013   AST 12 10/11/2007 1013   ALT 9 10/11/2007 1013   BILITOT 0.8 10/11/2007 1013       Studies/Results: No results found.  Medications: I have reviewed the patient's current medications.  Assessment/Plan:  1. Stage III colon cancer diagnosed in August 2008, status post adjuvant Xeloda chemotherapy, completed in April 2009. He underwent a colonoscopy 11/16/2011 with multiple polyps. 2. History of increased tearing, status post right tear duct stent placement.  3. History of multiple colonic polyps, status post  a negative colonoscopy by Dr. Elnoria Howard in May 2011.  4. Anxiety disorder.  5. Multiple back surgeries.  6. Hypertension.  7. Gastroesophageal reflux disease, status post a Nissen fundoplication.  8. Macular degeneration, followed by Dr. Luciana Axe.  9. Right ear "tinnitus," and hearing loss followed by Dr. Haroldine Laws.  10. Severe microcytic anemia. Ferritin returned low at 7 on 11/13/2011. He was transfused 2 units of blood. Bone marrow biopsy on 11/28/2011 confirmed decreased iron stores. The hemoglobin normalized. He continues oral iron. 11. Hemoccult positive stool. He underwent an upper endoscopy on 11/16/2011 with findings of moderate gastritis and question atypical duodenal AVMs. There was no evidence of active bleeding. Colonoscopy also on 11/16/2011 showed multiple polyps, hemorrhoids and diverticula. 12. Thrombocytopenia. Stable. 13. Mild leukopenia. Stable. 14. Mildly elevated LDH 11/13/2011. 15. Mildly elevated PT 11/13/2011. 16. 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 There was no evidence of metastatic carcinoma. Storage iron was decreased. Cytogenetic returned with a normal 46 XY karyotype. A molecular FISH panel was negative  Disposition-[name] remains in clinical remission from colon cancer. Blood counts remain stable. He will return for an office visit and labs in 4 months. He will contact the office in the interim with any problems.  Plan reviewed with Dr. Truett Perna.  Lonna Cobb ANP/GNP-BC

## 2013-08-06 ENCOUNTER — Other Ambulatory Visit: Payer: Self-pay | Admitting: Dermatology

## 2013-08-06 DIAGNOSIS — L988 Other specified disorders of the skin and subcutaneous tissue: Secondary | ICD-10-CM | POA: Diagnosis not present

## 2013-08-06 DIAGNOSIS — L905 Scar conditions and fibrosis of skin: Secondary | ICD-10-CM | POA: Diagnosis not present

## 2013-08-06 DIAGNOSIS — D2330 Other benign neoplasm of skin of unspecified part of face: Secondary | ICD-10-CM | POA: Diagnosis not present

## 2013-08-06 DIAGNOSIS — B354 Tinea corporis: Secondary | ICD-10-CM | POA: Diagnosis not present

## 2013-08-06 DIAGNOSIS — D485 Neoplasm of uncertain behavior of skin: Secondary | ICD-10-CM | POA: Diagnosis not present

## 2013-09-03 DIAGNOSIS — H353290 Exudative age-related macular degeneration, unspecified eye, stage unspecified: Secondary | ICD-10-CM | POA: Diagnosis not present

## 2013-09-03 DIAGNOSIS — H35059 Retinal neovascularization, unspecified, unspecified eye: Secondary | ICD-10-CM | POA: Diagnosis not present

## 2013-09-03 DIAGNOSIS — H35359 Cystoid macular degeneration, unspecified eye: Secondary | ICD-10-CM | POA: Diagnosis not present

## 2013-09-22 DIAGNOSIS — K648 Other hemorrhoids: Secondary | ICD-10-CM | POA: Diagnosis not present

## 2013-10-27 DIAGNOSIS — R3129 Other microscopic hematuria: Secondary | ICD-10-CM | POA: Diagnosis not present

## 2013-10-27 DIAGNOSIS — R300 Dysuria: Secondary | ICD-10-CM | POA: Diagnosis not present

## 2013-10-27 DIAGNOSIS — Z6827 Body mass index (BMI) 27.0-27.9, adult: Secondary | ICD-10-CM | POA: Diagnosis not present

## 2013-10-27 DIAGNOSIS — I10 Essential (primary) hypertension: Secondary | ICD-10-CM | POA: Diagnosis not present

## 2013-11-12 DIAGNOSIS — H353290 Exudative age-related macular degeneration, unspecified eye, stage unspecified: Secondary | ICD-10-CM | POA: Diagnosis not present

## 2013-11-12 DIAGNOSIS — H35059 Retinal neovascularization, unspecified, unspecified eye: Secondary | ICD-10-CM | POA: Diagnosis not present

## 2013-11-17 DIAGNOSIS — R3129 Other microscopic hematuria: Secondary | ICD-10-CM | POA: Diagnosis not present

## 2013-11-20 ENCOUNTER — Ambulatory Visit (HOSPITAL_BASED_OUTPATIENT_CLINIC_OR_DEPARTMENT_OTHER): Payer: Medicare Other | Admitting: Oncology

## 2013-11-20 ENCOUNTER — Other Ambulatory Visit (HOSPITAL_BASED_OUTPATIENT_CLINIC_OR_DEPARTMENT_OTHER): Payer: Medicare Other

## 2013-11-20 ENCOUNTER — Telehealth: Payer: Self-pay | Admitting: Oncology

## 2013-11-20 VITALS — BP 146/66 | HR 53 | Temp 97.6°F | Resp 17 | Ht 71.0 in | Wt 192.4 lb

## 2013-11-20 DIAGNOSIS — I10 Essential (primary) hypertension: Secondary | ICD-10-CM | POA: Diagnosis not present

## 2013-11-20 DIAGNOSIS — D509 Iron deficiency anemia, unspecified: Secondary | ICD-10-CM | POA: Diagnosis not present

## 2013-11-20 DIAGNOSIS — D4620 Refractory anemia with excess of blasts, unspecified: Secondary | ICD-10-CM

## 2013-11-20 DIAGNOSIS — C189 Malignant neoplasm of colon, unspecified: Secondary | ICD-10-CM

## 2013-11-20 DIAGNOSIS — D696 Thrombocytopenia, unspecified: Secondary | ICD-10-CM | POA: Diagnosis not present

## 2013-11-20 DIAGNOSIS — F411 Generalized anxiety disorder: Secondary | ICD-10-CM

## 2013-11-20 DIAGNOSIS — IMO0001 Reserved for inherently not codable concepts without codable children: Secondary | ICD-10-CM

## 2013-11-20 DIAGNOSIS — Z85038 Personal history of other malignant neoplasm of large intestine: Secondary | ICD-10-CM

## 2013-11-20 DIAGNOSIS — C182 Malignant neoplasm of ascending colon: Secondary | ICD-10-CM | POA: Diagnosis not present

## 2013-11-20 DIAGNOSIS — D72819 Decreased white blood cell count, unspecified: Secondary | ICD-10-CM

## 2013-11-20 DIAGNOSIS — D469 Myelodysplastic syndrome, unspecified: Principal | ICD-10-CM

## 2013-11-20 LAB — CBC WITH DIFFERENTIAL/PLATELET
BASO%: 0.2 % (ref 0.0–2.0)
Basophils Absolute: 0 10*3/uL (ref 0.0–0.1)
EOS%: 0 % (ref 0.0–7.0)
Eosinophils Absolute: 0 10*3/uL (ref 0.0–0.5)
HCT: 42.7 % (ref 38.4–49.9)
HGB: 14.4 g/dL (ref 13.0–17.1)
LYMPH#: 1.1 10*3/uL (ref 0.9–3.3)
LYMPH%: 29.5 % (ref 14.0–49.0)
MCH: 29.8 pg (ref 27.2–33.4)
MCHC: 33.7 g/dL (ref 32.0–36.0)
MCV: 88.5 fL (ref 79.3–98.0)
MONO#: 1.6 10*3/uL — AB (ref 0.1–0.9)
MONO%: 41.6 % — ABNORMAL HIGH (ref 0.0–14.0)
NEUT#: 1.1 10*3/uL — ABNORMAL LOW (ref 1.5–6.5)
NEUT%: 28.7 % — ABNORMAL LOW (ref 39.0–75.0)
Platelets: 46 10*3/uL — ABNORMAL LOW (ref 140–400)
RBC: 4.83 10*6/uL (ref 4.20–5.82)
RDW: 15.3 % — ABNORMAL HIGH (ref 11.0–14.6)
WBC: 3.8 10*3/uL — ABNORMAL LOW (ref 4.0–10.3)

## 2013-11-20 LAB — CEA: CEA: 2.4 ng/mL (ref 0.0–5.0)

## 2013-11-20 NOTE — Progress Notes (Signed)
  [facility] OFFICE PROGRESS NOTE   Diagnosis: Colon cancer, myelodysplasia  INTERVAL HISTORY:   He returns as scheduled. He reports several recent falls at home. One occurred when he was reaching for the night stand another when he was moving a trashcan. He reports an ecchymosis at the right hip after the fall 3 weeks ago.  Objective:  Vital signs in last 24 hours:  Blood pressure 146/66, pulse 53, temperature 97.6 F (36.4 C), temperature source Oral, resp. rate 17, height $RemoveBe'5\' 11"'RdIQiwEGc$  (1.803 m), weight 192 lb 6.4 oz (87.272 kg), SpO2 99.00%. repeat manual pulse-80    HEENT: No thrush or bleeding Lymphatics: No cervical, supraclavicular, axillary, or inguinal nodes Resp: Distant breath sounds, no respiratory distress Cardio: Every third beat is a premature beat GI: No hepatosplenomegaly, nontender, no mass Vascular: No leg edema  Skin: No ecchymoses    Lab Results:  Lab Results  Component Value Date   WBC 3.8* 11/20/2013   HGB 14.4 11/20/2013   HCT 42.7 11/20/2013   MCV 88.5 11/20/2013   PLT 46* 11/20/2013   NEUTROABS 1.1* 11/20/2013     Lab Results  Component Value Date   CEA 2.0 04/24/2013    Medications: I have reviewed the patient's current medications.  Assessment/Plan: 1. Stage III colon cancer diagnosed in August 2008, status post adjuvant Xeloda chemotherapy, completed in April 2009. He underwent a colonoscopy 11/16/2011 with multiple polyps. 2. History of increased tearing, status post right tear duct stent placement.  3. History of multiple colonic polyps, status post a negative colonoscopy by Dr. Benson Norway in May 2011.  4. Anxiety disorder.  5. Multiple back surgeries.  6. Hypertension.  7. Gastroesophageal reflux disease, status post a Nissen fundoplication.  8. Macular degeneration, followed by Dr. Zadie Rhine.  9. Right ear "tinnitus," and hearing loss followed by Dr. Ernesto Rutherford.  10. Severe microcytic anemia. Ferritin returned low at 7 on 11/13/2011. He was  transfused 2 units of blood. Bone marrow biopsy on 11/28/2011 confirmed decreased iron stores. The hemoglobin normalized. He continues oral iron. 11. Hemoccult positive stool. He underwent an upper endoscopy on 11/16/2011 with findings of moderate gastritis and question atypical duodenal AVMs. There was no evidence of active bleeding. Colonoscopy also on 11/16/2011 showed multiple polyps, hemorrhoids and diverticula. 12. Thrombocytopenia. Stable. 13. Mild leukopenia. Stable. 14. Mildly elevated LDH 11/13/2011. 15. Mildly elevated PT 11/13/2011. 16. 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 There was no evidence of metastatic carcinoma. Storage iron was decreased. Cytogenetic returned with a normal 85 XY karyotype. A molecular FISH panel was negative  Disposition:  Mr. Pinney is stable from a hematologic standpoint. He will return for a CBC in 2 months. He is scheduled for an office visit in 4 months. He continues colonoscopy followup with Dr. Benson Norway.  Mr. Pillsbury will followup with Dr. Ardeth Perfect for the premature heartbeats.   I cautioned him to be careful to not fall.    Betsy Coder, MD  11/20/2013  9:52 AM

## 2013-11-20 NOTE — Telephone Encounter (Signed)
gv pt appt schedule for june and august.

## 2013-12-09 DIAGNOSIS — N281 Cyst of kidney, acquired: Secondary | ICD-10-CM | POA: Diagnosis not present

## 2013-12-09 DIAGNOSIS — R3129 Other microscopic hematuria: Secondary | ICD-10-CM | POA: Diagnosis not present

## 2013-12-10 DIAGNOSIS — I10 Essential (primary) hypertension: Secondary | ICD-10-CM | POA: Diagnosis not present

## 2013-12-10 DIAGNOSIS — F411 Generalized anxiety disorder: Secondary | ICD-10-CM | POA: Diagnosis not present

## 2013-12-10 DIAGNOSIS — I4949 Other premature depolarization: Secondary | ICD-10-CM | POA: Diagnosis not present

## 2013-12-10 DIAGNOSIS — K219 Gastro-esophageal reflux disease without esophagitis: Secondary | ICD-10-CM | POA: Diagnosis not present

## 2013-12-10 DIAGNOSIS — D509 Iron deficiency anemia, unspecified: Secondary | ICD-10-CM | POA: Diagnosis not present

## 2013-12-10 DIAGNOSIS — H3530 Unspecified macular degeneration: Secondary | ICD-10-CM | POA: Diagnosis not present

## 2013-12-10 DIAGNOSIS — D696 Thrombocytopenia, unspecified: Secondary | ICD-10-CM | POA: Diagnosis not present

## 2013-12-10 DIAGNOSIS — R3129 Other microscopic hematuria: Secondary | ICD-10-CM | POA: Diagnosis not present

## 2013-12-10 DIAGNOSIS — C189 Malignant neoplasm of colon, unspecified: Secondary | ICD-10-CM | POA: Diagnosis not present

## 2013-12-11 DIAGNOSIS — R3129 Other microscopic hematuria: Secondary | ICD-10-CM | POA: Diagnosis not present

## 2013-12-11 DIAGNOSIS — Q619 Cystic kidney disease, unspecified: Secondary | ICD-10-CM | POA: Diagnosis not present

## 2013-12-22 ENCOUNTER — Other Ambulatory Visit (HOSPITAL_COMMUNITY): Payer: Self-pay | Admitting: Internal Medicine

## 2013-12-22 ENCOUNTER — Ambulatory Visit (HOSPITAL_COMMUNITY): Payer: Medicare Other | Attending: Cardiovascular Disease | Admitting: Cardiology

## 2013-12-22 DIAGNOSIS — I4949 Other premature depolarization: Secondary | ICD-10-CM | POA: Insufficient documentation

## 2013-12-22 DIAGNOSIS — I10 Essential (primary) hypertension: Secondary | ICD-10-CM | POA: Diagnosis not present

## 2013-12-22 NOTE — Progress Notes (Signed)
Echo performed. 

## 2014-01-22 ENCOUNTER — Other Ambulatory Visit (HOSPITAL_BASED_OUTPATIENT_CLINIC_OR_DEPARTMENT_OTHER): Payer: Medicare Other

## 2014-01-22 ENCOUNTER — Telehealth: Payer: Self-pay | Admitting: *Deleted

## 2014-01-22 DIAGNOSIS — D469 Myelodysplastic syndrome, unspecified: Principal | ICD-10-CM

## 2014-01-22 DIAGNOSIS — IMO0001 Reserved for inherently not codable concepts without codable children: Secondary | ICD-10-CM

## 2014-01-22 DIAGNOSIS — D696 Thrombocytopenia, unspecified: Secondary | ICD-10-CM

## 2014-01-22 LAB — CBC WITH DIFFERENTIAL/PLATELET
BASO%: 0.4 % (ref 0.0–2.0)
BASOS ABS: 0 10*3/uL (ref 0.0–0.1)
EOS%: 0.1 % (ref 0.0–7.0)
Eosinophils Absolute: 0 10*3/uL (ref 0.0–0.5)
HCT: 45.8 % (ref 38.4–49.9)
HEMOGLOBIN: 15.2 g/dL (ref 13.0–17.1)
LYMPH#: 1 10*3/uL (ref 0.9–3.3)
LYMPH%: 24.4 % (ref 14.0–49.0)
MCH: 29.3 pg (ref 27.2–33.4)
MCHC: 33.2 g/dL (ref 32.0–36.0)
MCV: 88.4 fL (ref 79.3–98.0)
MONO#: 1.5 10*3/uL — ABNORMAL HIGH (ref 0.1–0.9)
MONO%: 38.9 % — ABNORMAL HIGH (ref 0.0–14.0)
NEUT#: 1.4 10*3/uL — ABNORMAL LOW (ref 1.5–6.5)
NEUT%: 36.2 % — ABNORMAL LOW (ref 39.0–75.0)
Platelets: 66 10*3/uL — ABNORMAL LOW (ref 140–400)
RBC: 5.18 10*6/uL (ref 4.20–5.82)
RDW: 14.4 % (ref 11.0–14.6)
WBC: 4 10*3/uL (ref 4.0–10.3)

## 2014-01-22 NOTE — Telephone Encounter (Signed)
Message copied by Brien Few on Thu Jan 22, 2014  5:17 PM ------      Message from: Betsy Coder B      Created: Thu Jan 22, 2014  5:06 PM       Please call patient, cbc is stable, f/u as scheduled ------

## 2014-01-22 NOTE — Telephone Encounter (Signed)
Called pt with CBC results. Stable, per Dr. Benay Spice. Follow up as scheduled. Pt voiced understanding.

## 2014-01-26 DIAGNOSIS — L259 Unspecified contact dermatitis, unspecified cause: Secondary | ICD-10-CM | POA: Diagnosis not present

## 2014-01-26 DIAGNOSIS — L905 Scar conditions and fibrosis of skin: Secondary | ICD-10-CM | POA: Diagnosis not present

## 2014-02-06 DIAGNOSIS — H35059 Retinal neovascularization, unspecified, unspecified eye: Secondary | ICD-10-CM | POA: Diagnosis not present

## 2014-02-06 DIAGNOSIS — H353290 Exudative age-related macular degeneration, unspecified eye, stage unspecified: Secondary | ICD-10-CM | POA: Diagnosis not present

## 2014-02-06 DIAGNOSIS — H35359 Cystoid macular degeneration, unspecified eye: Secondary | ICD-10-CM | POA: Diagnosis not present

## 2014-03-17 DIAGNOSIS — L905 Scar conditions and fibrosis of skin: Secondary | ICD-10-CM | POA: Diagnosis not present

## 2014-03-17 DIAGNOSIS — L259 Unspecified contact dermatitis, unspecified cause: Secondary | ICD-10-CM | POA: Diagnosis not present

## 2014-03-26 ENCOUNTER — Ambulatory Visit (HOSPITAL_BASED_OUTPATIENT_CLINIC_OR_DEPARTMENT_OTHER): Payer: Medicare Other | Admitting: Nurse Practitioner

## 2014-03-26 ENCOUNTER — Telehealth: Payer: Self-pay | Admitting: Oncology

## 2014-03-26 ENCOUNTER — Other Ambulatory Visit (HOSPITAL_BASED_OUTPATIENT_CLINIC_OR_DEPARTMENT_OTHER): Payer: Medicare Other

## 2014-03-26 VITALS — BP 116/72 | HR 82 | Temp 98.0°F | Resp 18 | Ht 71.0 in | Wt 184.5 lb

## 2014-03-26 DIAGNOSIS — Q068 Other specified congenital malformations of spinal cord: Secondary | ICD-10-CM

## 2014-03-26 DIAGNOSIS — D469 Myelodysplastic syndrome, unspecified: Principal | ICD-10-CM

## 2014-03-26 DIAGNOSIS — Z85038 Personal history of other malignant neoplasm of large intestine: Secondary | ICD-10-CM

## 2014-03-26 DIAGNOSIS — IMO0001 Reserved for inherently not codable concepts without codable children: Secondary | ICD-10-CM

## 2014-03-26 LAB — CBC WITH DIFFERENTIAL/PLATELET
BASO%: 0.4 % (ref 0.0–2.0)
BASOS ABS: 0 10*3/uL (ref 0.0–0.1)
EOS ABS: 0 10*3/uL (ref 0.0–0.5)
EOS%: 0 % (ref 0.0–7.0)
HCT: 42 % (ref 38.4–49.9)
HEMOGLOBIN: 14.2 g/dL (ref 13.0–17.1)
LYMPH%: 11.8 % — ABNORMAL LOW (ref 14.0–49.0)
MCH: 29.6 pg (ref 27.2–33.4)
MCHC: 33.7 g/dL (ref 32.0–36.0)
MCV: 87.7 fL (ref 79.3–98.0)
MONO#: 2.3 10*3/uL — AB (ref 0.1–0.9)
MONO%: 41.6 % — ABNORMAL HIGH (ref 0.0–14.0)
NEUT%: 46.2 % (ref 39.0–75.0)
NEUTROS ABS: 2.6 10*3/uL (ref 1.5–6.5)
Platelets: 62 10*3/uL — ABNORMAL LOW (ref 140–400)
RBC: 4.79 10*6/uL (ref 4.20–5.82)
RDW: 15 % — AB (ref 11.0–14.6)
WBC: 5.6 10*3/uL (ref 4.0–10.3)
lymph#: 0.7 10*3/uL — ABNORMAL LOW (ref 0.9–3.3)

## 2014-03-26 NOTE — Progress Notes (Signed)
  [name]   Diagnosis:  Colon cancer, myelodysplasia.  INTERVAL HISTORY:   [name] returns as scheduled. He continues to have falls. The most recent one occurred when he was trying to walk around a car. He continues to have a poor energy level. He reports this has been ongoing for many many months. No interim illnesses or infections. He denies fever. No bleeding. He has a good appetite. Bowels moving regularly. He notes that he urinates frequently during the night. We reviewed his medications. He thinks that he may be taking hydrochlorothiazide prior to going to bed.  Objective:  Vital signs in last 24 hours:  Blood pressure 116/72, pulse 82, temperature 98 F (36.7 C), temperature source Oral, resp. rate 18, height _0  (1.803 m), weight 184 lb 8 oz (83.689 kg), SpO2 98.00%.    HEENT: No thrush or ulcerations. Lymphatics: No palpable cervical, supraclavicular, axillary or inguinal lymph nodes. Resp: Distant breath sounds. Cardio: Regular rate and rhythm. GI: Abdomen soft and nontender. No hepatomegaly. Vascular: No leg edema. Neuro: Alert and oriented. Motor strength 5 over 5 in all extremities.     Lab Results:  Lab Results  Component Value Date   WBC 5.6 03/26/2014   HGB 14.2 03/26/2014   HCT 42.0 03/26/2014   MCV 87.7 03/26/2014   PLT 62* 03/26/2014   NEUTROABS 2.6 03/26/2014    Imaging:  No results found.  Medications: I have reviewed the patient's current medications.  Assessment/Plan: 1. Stage III colon cancer diagnosed in August 2008, status post adjuvant Xeloda chemotherapy, completed in April 2009. He underwent a colonoscopy 11/16/2011 with multiple polyps. 2. History of increased tearing, status post right tear duct stent placement.  3. History of multiple colonic polyps, status post a negative colonoscopy by Dr. Benson Norway in May 2011.  4. Anxiety disorder.  5. Multiple back surgeries.  6. Hypertension.  7. Gastroesophageal  reflux disease, status post a Nissen fundoplication.  8. Macular degeneration, followed by Dr. Zadie Rhine.  9. Right ear "tinnitus," and hearing loss followed by Dr. Ernesto Rutherford.  10. Severe microcytic anemia. Ferritin returned low at 7 on 11/13/2011. He was transfused 2 units of blood. Bone marrow biopsy on 11/28/2011 confirmed decreased iron stores. The hemoglobin normalized. He continues oral iron. 11. Hemoccult positive stool. He underwent an upper endoscopy on 11/16/2011 with findings of moderate gastritis and question atypical duodenal AVMs. There was no evidence of active bleeding. Colonoscopy also on 11/16/2011 showed multiple polyps, hemorrhoids and diverticula. 12. Thrombocytopenia. Stable. 13. Mild leukopenia. Stable. 14. Mildly elevated LDH 11/13/2011. 15. Mildly elevated PT 11/13/2011. 16. 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 There was no evidence of metastatic carcinoma. Storage iron was decreased. Cytogenetic returned with a normal 78 XY karyotype. A molecular FISH panel was negative   Disposition: [name] remains stable from a hematologic standpoint. He will return for a CBC in 2 months and an office visit in 4 months.  He will followup with his primary care provider regarding the falls and poor energy level.    Ned Card ANP/GNP-BC   03/26/2014  1:23 PM

## 2014-03-26 NOTE — Telephone Encounter (Signed)
[facility] ADN PRINTED APPT SCHED AND AVS FO RPT FOR OCT AND NOV

## 2014-03-31 ENCOUNTER — Telehealth: Payer: Self-pay | Admitting: *Deleted

## 2014-03-31 NOTE — Telephone Encounter (Signed)
Call requesting last office note be sent to the PCP, Dr. Ardeth Perfect. Discussed his dizziness with PCP and they have not received the visit note from 03/26/14.

## 2014-04-07 DIAGNOSIS — G4700 Insomnia, unspecified: Secondary | ICD-10-CM | POA: Diagnosis not present

## 2014-04-07 DIAGNOSIS — I10 Essential (primary) hypertension: Secondary | ICD-10-CM | POA: Diagnosis not present

## 2014-04-07 DIAGNOSIS — Z6825 Body mass index (BMI) 25.0-25.9, adult: Secondary | ICD-10-CM | POA: Diagnosis not present

## 2014-05-07 DIAGNOSIS — H35059 Retinal neovascularization, unspecified, unspecified eye: Secondary | ICD-10-CM | POA: Diagnosis not present

## 2014-05-07 DIAGNOSIS — H353290 Exudative age-related macular degeneration, unspecified eye, stage unspecified: Secondary | ICD-10-CM | POA: Diagnosis not present

## 2014-05-21 ENCOUNTER — Other Ambulatory Visit (HOSPITAL_BASED_OUTPATIENT_CLINIC_OR_DEPARTMENT_OTHER): Payer: Medicare Other

## 2014-05-21 ENCOUNTER — Telehealth: Payer: Self-pay | Admitting: *Deleted

## 2014-05-21 DIAGNOSIS — Q069 Congenital malformation of spinal cord, unspecified: Secondary | ICD-10-CM

## 2014-05-21 DIAGNOSIS — IMO0001 Reserved for inherently not codable concepts without codable children: Secondary | ICD-10-CM

## 2014-05-21 DIAGNOSIS — D469 Myelodysplastic syndrome, unspecified: Principal | ICD-10-CM

## 2014-05-21 LAB — CBC WITH DIFFERENTIAL/PLATELET
BASO%: 0.4 % (ref 0.0–2.0)
Basophils Absolute: 0 10*3/uL (ref 0.0–0.1)
EOS ABS: 0 10*3/uL (ref 0.0–0.5)
EOS%: 0.1 % (ref 0.0–7.0)
HEMATOCRIT: 43.3 % (ref 38.4–49.9)
HEMOGLOBIN: 14.1 g/dL (ref 13.0–17.1)
LYMPH%: 16.7 % (ref 14.0–49.0)
MCH: 29.9 pg (ref 27.2–33.4)
MCHC: 32.5 g/dL (ref 32.0–36.0)
MCV: 92.1 fL (ref 79.3–98.0)
MONO#: 1.2 10*3/uL — AB (ref 0.1–0.9)
MONO%: 33.3 % — AB (ref 0.0–14.0)
NEUT%: 49.5 % (ref 39.0–75.0)
NEUTROS ABS: 1.8 10*3/uL (ref 1.5–6.5)
PLATELETS: 64 10*3/uL — AB (ref 140–400)
RBC: 4.7 10*6/uL (ref 4.20–5.82)
RDW: 15.1 % — ABNORMAL HIGH (ref 11.0–14.6)
WBC: 3.6 10*3/uL — ABNORMAL LOW (ref 4.0–10.3)
lymph#: 0.6 10*3/uL — ABNORMAL LOW (ref 0.9–3.3)

## 2014-05-21 NOTE — Telephone Encounter (Signed)
Left Message for patient to call back regarding lab results.   

## 2014-05-21 NOTE — Telephone Encounter (Signed)
Message copied by Wardell Heath on Thu May 21, 2014 10:54 AM ------      Message from: Ned Card K      Created: Thu May 21, 2014 10:30 AM       Please let him know blood counts are stable. Followup as scheduled. ------

## 2014-05-25 ENCOUNTER — Telehealth: Payer: Self-pay

## 2014-05-25 NOTE — Telephone Encounter (Signed)
Called and informed pt blood counts were stable. Pt verbalized understanding and denies any questions at this time. Informed pt to f/u as scheduled and to call with any questions or concerns if needed.

## 2014-05-25 NOTE — Telephone Encounter (Signed)
Message copied by Bevelyn Ngo on Mon May 25, 2014  8:59 AM ------      Message from: [place] K      Created: Thu May 21, 2014 10:30 AM       Please let him know blood counts are stable. Followup as scheduled. ------

## 2014-06-04 DIAGNOSIS — I10 Essential (primary) hypertension: Secondary | ICD-10-CM | POA: Diagnosis not present

## 2014-06-04 DIAGNOSIS — R8299 Other abnormal findings in urine: Secondary | ICD-10-CM | POA: Diagnosis not present

## 2014-06-04 DIAGNOSIS — Z125 Encounter for screening for malignant neoplasm of prostate: Secondary | ICD-10-CM | POA: Diagnosis not present

## 2014-06-11 DIAGNOSIS — N400 Enlarged prostate without lower urinary tract symptoms: Secondary | ICD-10-CM | POA: Diagnosis not present

## 2014-06-11 DIAGNOSIS — Z0000 Encounter for general adult medical examination without abnormal findings: Secondary | ICD-10-CM | POA: Diagnosis not present

## 2014-06-11 DIAGNOSIS — H8110 Benign paroxysmal vertigo, unspecified ear: Secondary | ICD-10-CM | POA: Diagnosis not present

## 2014-06-11 DIAGNOSIS — R312 Other microscopic hematuria: Secondary | ICD-10-CM | POA: Diagnosis not present

## 2014-06-11 DIAGNOSIS — J3489 Other specified disorders of nose and nasal sinuses: Secondary | ICD-10-CM | POA: Diagnosis not present

## 2014-06-11 DIAGNOSIS — G4700 Insomnia, unspecified: Secondary | ICD-10-CM | POA: Diagnosis not present

## 2014-06-11 DIAGNOSIS — I493 Ventricular premature depolarization: Secondary | ICD-10-CM | POA: Diagnosis not present

## 2014-06-11 DIAGNOSIS — Z23 Encounter for immunization: Secondary | ICD-10-CM | POA: Diagnosis not present

## 2014-06-15 DIAGNOSIS — H3532 Exudative age-related macular degeneration: Secondary | ICD-10-CM | POA: Diagnosis not present

## 2014-06-15 DIAGNOSIS — H35051 Retinal neovascularization, unspecified, right eye: Secondary | ICD-10-CM | POA: Diagnosis not present

## 2014-07-09 ENCOUNTER — Ambulatory Visit (HOSPITAL_BASED_OUTPATIENT_CLINIC_OR_DEPARTMENT_OTHER): Payer: Medicare Other | Admitting: Oncology

## 2014-07-09 ENCOUNTER — Telehealth: Payer: Self-pay | Admitting: Oncology

## 2014-07-09 ENCOUNTER — Other Ambulatory Visit (HOSPITAL_BASED_OUTPATIENT_CLINIC_OR_DEPARTMENT_OTHER): Payer: Medicare Other

## 2014-07-09 VITALS — BP 155/69 | HR 79 | Temp 98.0°F | Resp 19 | Ht 71.0 in | Wt 190.9 lb

## 2014-07-09 DIAGNOSIS — D509 Iron deficiency anemia, unspecified: Secondary | ICD-10-CM

## 2014-07-09 DIAGNOSIS — D696 Thrombocytopenia, unspecified: Secondary | ICD-10-CM | POA: Diagnosis not present

## 2014-07-09 DIAGNOSIS — C189 Malignant neoplasm of colon, unspecified: Secondary | ICD-10-CM

## 2014-07-09 DIAGNOSIS — IMO0001 Reserved for inherently not codable concepts without codable children: Secondary | ICD-10-CM

## 2014-07-09 DIAGNOSIS — D72819 Decreased white blood cell count, unspecified: Secondary | ICD-10-CM | POA: Diagnosis not present

## 2014-07-09 DIAGNOSIS — I10 Essential (primary) hypertension: Secondary | ICD-10-CM

## 2014-07-09 DIAGNOSIS — D469 Myelodysplastic syndrome, unspecified: Principal | ICD-10-CM

## 2014-07-09 DIAGNOSIS — Z85038 Personal history of other malignant neoplasm of large intestine: Secondary | ICD-10-CM | POA: Diagnosis not present

## 2014-07-09 LAB — CBC WITH DIFFERENTIAL/PLATELET
BASO%: 0 % (ref 0.0–2.0)
Basophils Absolute: 0 10*3/uL (ref 0.0–0.1)
EOS ABS: 0 10*3/uL (ref 0.0–0.5)
EOS%: 0.3 % (ref 0.0–7.0)
HCT: 43.3 % (ref 38.4–49.9)
HGB: 14.5 g/dL (ref 13.0–17.1)
LYMPH#: 0.9 10*3/uL (ref 0.9–3.3)
LYMPH%: 27.2 % (ref 14.0–49.0)
MCH: 30.2 pg (ref 27.2–33.4)
MCHC: 33.5 g/dL (ref 32.0–36.0)
MCV: 90.2 fL (ref 79.3–98.0)
MONO#: 1.1 10*3/uL — AB (ref 0.1–0.9)
MONO%: 36.1 % — ABNORMAL HIGH (ref 0.0–14.0)
NEUT#: 1.2 10*3/uL — ABNORMAL LOW (ref 1.5–6.5)
NEUT%: 36.4 % — ABNORMAL LOW (ref 39.0–75.0)
Platelets: 59 10*3/uL — ABNORMAL LOW (ref 140–400)
RBC: 4.8 10*6/uL (ref 4.20–5.82)
RDW: 14.1 % (ref 11.0–14.6)
WBC: 3.2 10*3/uL — AB (ref 4.0–10.3)

## 2014-07-09 NOTE — Telephone Encounter (Signed)
Gave avs & cal for March 2016. °

## 2014-07-09 NOTE — Progress Notes (Signed)
  [facility] OFFICE PROGRESS NOTE   Diagnosis: Colon cancer, myelodysplasia  INTERVAL HISTORY:   Mr. Catalfamo returns as scheduled. He feels well. Good appetite. He has received an influenza vaccine.  Objective:  Vital signs in last 24 hours:  Blood pressure 155/69, pulse 79, temperature 98 F (36.7 C), temperature source Oral, resp. rate 19, height $RemoveBe'5\' 11"'dnxAzUTiD$  (1.803 m), weight 190 lb 14.4 oz (86.592 kg).    HEENT: Neck without mass Lymphatics: No cervical, supra-clavicular, axillary, or inguinal nodes Resp: Lungs clear bilaterally Cardio: Regular rate and rhythm with premature beats. GI: No hepatosplenomegaly Vascular: Trace pitting edema at the lower leg and ankle bilaterally  Skin: Small ecchymoses at the forearms     Lab Results:  Lab Results  Component Value Date   WBC 3.2* 07/09/2014   HGB 14.5 07/09/2014   HCT 43.3 07/09/2014   MCV 90.2 07/09/2014   PLT 59* 07/09/2014   NEUTROABS 1.2* 07/09/2014     Lab Results  Component Value Date   CEA 2.4 11/20/2013    Medications: I have reviewed the patient's current medications.  Assessment/Plan: 1. Stage III colon cancer diagnosed in August 2008, status post adjuvant Xeloda chemotherapy, completed in April 2009. He underwent a colonoscopy 11/16/2011 with multiple polyps. 2. History of increased tearing, status post right tear duct stent placement.  3. History of multiple colonic polyps, status post a negative colonoscopy by Dr. Benson Norway in May 2011.  4. Anxiety disorder.  5. Multiple back surgeries.  6. Hypertension.  7. Gastroesophageal reflux disease, status post a Nissen fundoplication.  8. Macular degeneration, followed by Dr. Zadie Rhine.  9. Right ear "tinnitus," and hearing loss followed by Dr. Ernesto Rutherford.  10. Severe microcytic anemia. Ferritin returned low at 7 on 11/13/2011. He was transfused 2 units of blood. Bone marrow biopsy on 11/28/2011 confirmed decreased iron stores. The hemoglobin  normalized. He continues oral iron. 11. Hemoccult positive stool. He underwent an upper endoscopy on 11/16/2011 with findings of moderate gastritis and question atypical duodenal AVMs. There was no evidence of active bleeding. Colonoscopy also on 11/16/2011 showed multiple polyps, hemorrhoids and diverticula. 12. Thrombocytopenia. Stable. 13. Mild leukopenia. Stable. 14. Mildly elevated LDH 11/13/2011. 15. Mildly elevated PT 11/13/2011. 16. 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 There was no evidence of metastatic carcinoma. Storage iron was decreased. Cytogenetic returned with a normal 55 XY karyotype. A molecular FISH panel was negative   Disposition:  Mr. Winkels remains stable from a hematologic standpoint. He will return for an office and lab visit in 4 months. He will remain up-to-date only influenza and pneumococcal vaccines with Dr. Ardeth Perfect.  Betsy Coder, MD  07/09/2014  11:07 AM

## 2014-07-15 DIAGNOSIS — L249 Irritant contact dermatitis, unspecified cause: Secondary | ICD-10-CM | POA: Diagnosis not present

## 2014-07-15 DIAGNOSIS — L821 Other seborrheic keratosis: Secondary | ICD-10-CM | POA: Diagnosis not present

## 2014-07-27 DIAGNOSIS — H35051 Retinal neovascularization, unspecified, right eye: Secondary | ICD-10-CM | POA: Diagnosis not present

## 2014-07-27 DIAGNOSIS — H3532 Exudative age-related macular degeneration: Secondary | ICD-10-CM | POA: Diagnosis not present

## 2014-09-04 DIAGNOSIS — H833X3 Noise effects on inner ear, bilateral: Secondary | ICD-10-CM | POA: Diagnosis not present

## 2014-09-04 DIAGNOSIS — H6240 Otitis externa in other diseases classified elsewhere, unspecified ear: Secondary | ICD-10-CM | POA: Diagnosis not present

## 2014-09-04 DIAGNOSIS — H6121 Impacted cerumen, right ear: Secondary | ICD-10-CM | POA: Diagnosis not present

## 2014-09-04 DIAGNOSIS — K143 Hypertrophy of tongue papillae: Secondary | ICD-10-CM | POA: Diagnosis not present

## 2014-09-04 DIAGNOSIS — J328 Other chronic sinusitis: Secondary | ICD-10-CM | POA: Diagnosis not present

## 2014-09-04 DIAGNOSIS — B369 Superficial mycosis, unspecified: Secondary | ICD-10-CM | POA: Diagnosis not present

## 2014-09-08 DIAGNOSIS — H35051 Retinal neovascularization, unspecified, right eye: Secondary | ICD-10-CM | POA: Diagnosis not present

## 2014-09-08 DIAGNOSIS — H3532 Exudative age-related macular degeneration: Secondary | ICD-10-CM | POA: Diagnosis not present

## 2014-09-14 DIAGNOSIS — H903 Sensorineural hearing loss, bilateral: Secondary | ICD-10-CM | POA: Diagnosis not present

## 2014-09-17 DIAGNOSIS — B369 Superficial mycosis, unspecified: Secondary | ICD-10-CM | POA: Diagnosis not present

## 2014-09-17 DIAGNOSIS — M2662 Arthralgia of temporomandibular joint: Secondary | ICD-10-CM | POA: Diagnosis not present

## 2014-09-17 DIAGNOSIS — Z8709 Personal history of other diseases of the respiratory system: Secondary | ICD-10-CM | POA: Diagnosis not present

## 2014-09-17 DIAGNOSIS — H6243 Otitis externa in other diseases classified elsewhere, bilateral: Secondary | ICD-10-CM | POA: Diagnosis not present

## 2014-09-17 DIAGNOSIS — H6240 Otitis externa in other diseases classified elsewhere, unspecified ear: Secondary | ICD-10-CM | POA: Diagnosis not present

## 2014-09-17 DIAGNOSIS — K143 Hypertrophy of tongue papillae: Secondary | ICD-10-CM | POA: Diagnosis not present

## 2014-10-20 DIAGNOSIS — H35051 Retinal neovascularization, unspecified, right eye: Secondary | ICD-10-CM | POA: Diagnosis not present

## 2014-10-20 DIAGNOSIS — H3532 Exudative age-related macular degeneration: Secondary | ICD-10-CM | POA: Diagnosis not present

## 2014-10-26 DIAGNOSIS — Z8709 Personal history of other diseases of the respiratory system: Secondary | ICD-10-CM | POA: Diagnosis not present

## 2014-10-26 DIAGNOSIS — L299 Pruritus, unspecified: Secondary | ICD-10-CM | POA: Diagnosis not present

## 2014-10-26 DIAGNOSIS — K143 Hypertrophy of tongue papillae: Secondary | ICD-10-CM | POA: Diagnosis not present

## 2014-11-05 ENCOUNTER — Other Ambulatory Visit (HOSPITAL_BASED_OUTPATIENT_CLINIC_OR_DEPARTMENT_OTHER): Payer: Medicare Other

## 2014-11-05 ENCOUNTER — Ambulatory Visit (HOSPITAL_BASED_OUTPATIENT_CLINIC_OR_DEPARTMENT_OTHER): Payer: Medicare Other | Admitting: Nurse Practitioner

## 2014-11-05 ENCOUNTER — Telehealth: Payer: Self-pay | Admitting: Nurse Practitioner

## 2014-11-05 VITALS — BP 149/62 | HR 68 | Temp 97.7°F | Resp 18 | Ht 71.0 in | Wt 189.5 lb

## 2014-11-05 DIAGNOSIS — C182 Malignant neoplasm of ascending colon: Secondary | ICD-10-CM | POA: Diagnosis not present

## 2014-11-05 DIAGNOSIS — D469 Myelodysplastic syndrome, unspecified: Principal | ICD-10-CM

## 2014-11-05 DIAGNOSIS — Z85038 Personal history of other malignant neoplasm of large intestine: Secondary | ICD-10-CM

## 2014-11-05 DIAGNOSIS — C189 Malignant neoplasm of colon, unspecified: Secondary | ICD-10-CM

## 2014-11-05 DIAGNOSIS — IMO0001 Reserved for inherently not codable concepts without codable children: Secondary | ICD-10-CM

## 2014-11-05 LAB — CBC WITH DIFFERENTIAL/PLATELET
BASO%: 0 % (ref 0.0–2.0)
BASOS ABS: 0 10*3/uL (ref 0.0–0.1)
EOS%: 0 % (ref 0.0–7.0)
Eosinophils Absolute: 0 10*3/uL (ref 0.0–0.5)
HEMATOCRIT: 40.3 % (ref 38.4–49.9)
HGB: 13.5 g/dL (ref 13.0–17.1)
LYMPH%: 24.1 % (ref 14.0–49.0)
MCH: 30.1 pg (ref 27.2–33.4)
MCHC: 33.5 g/dL (ref 32.0–36.0)
MCV: 90 fL (ref 79.3–98.0)
MONO#: 1.5 10*3/uL — ABNORMAL HIGH (ref 0.1–0.9)
MONO%: 47.2 % — ABNORMAL HIGH (ref 0.0–14.0)
NEUT#: 0.9 10*3/uL — ABNORMAL LOW (ref 1.5–6.5)
NEUT%: 28.7 % — AB (ref 39.0–75.0)
PLATELETS: 55 10*3/uL — AB (ref 140–400)
RBC: 4.48 10*6/uL (ref 4.20–5.82)
RDW: 14.8 % — AB (ref 11.0–14.6)
WBC: 3.2 10*3/uL — AB (ref 4.0–10.3)
lymph#: 0.8 10*3/uL — ABNORMAL LOW (ref 0.9–3.3)

## 2014-11-05 NOTE — Progress Notes (Signed)
  [name] OFFICE PROGRESS NOTE   Diagnosis:  Colon cancer, myelodysplasia  INTERVAL HISTORY:   [name] returns as scheduled. No interim illnesses or infections. No fevers or sweats. He denies any bleeding. He continues to note easy bruising. Good appetite. Bowels overall moving regularly. He reports a persistent scalp rash and would like a referral to dermatology. He continues oral iron and wonders how long he will need to take this.  Objective:  Vital signs in last 24 hours:  Blood pressure 149/62, pulse 68, temperature 97.7 F (36.5 C), temperature source Oral, resp. rate 18, height $RemoveBe'5\' 11"'gcxpebOyl$  (1.803 m), weight 189 lb 8 oz (85.957 kg), SpO2 99 %.    HEENT: No thrush or ulcers. Lymphatics: No palpable cervical, supraclavicular, axillary or inguinal lymph nodes. Resp: Lungs clear bilaterally. Cardio: Regular rate and rhythm with occasional premature beat. GI: Abdomen soft and nontender. No hepatomegaly. No mass. Vascular: Very minimal lower leg edema bilaterally.  Skin: Scabbed appearing linear rash posterior scalp.   Lab Results:  Lab Results  Component Value Date   WBC 3.2* 11/05/2014   HGB 13.5 11/05/2014   HCT 40.3 11/05/2014   MCV 90.0 11/05/2014   PLT 55* 11/05/2014   NEUTROABS 0.9* 11/05/2014    Imaging:  No results found.  Medications: I have reviewed the patient's current medications.  Assessment/Plan: 1. Stage III colon cancer diagnosed in August 2008, status post adjuvant Xeloda chemotherapy, completed in April 2009. He underwent a colonoscopy 11/16/2011 with multiple polyps. 2. History of increased tearing, status post right tear duct stent placement.  3. History of multiple colonic polyps, status post a negative colonoscopy by Dr. Benson Norway in May 2011.  4. Anxiety disorder.  5. Multiple back surgeries.  6. Hypertension.  7. Gastroesophageal reflux disease, status post a Nissen fundoplication.  8. Macular degeneration, followed by  Dr. Zadie Rhine.  9. Right ear "tinnitus," and hearing loss followed by Dr. Ernesto Rutherford.  10. Severe microcytic anemia. Ferritin returned low at 7 on 11/13/2011. He was transfused 2 units of blood. Bone marrow biopsy on 11/28/2011 confirmed decreased iron stores. The hemoglobin normalized. He continues oral iron. 11. Hemoccult positive stool. He underwent an upper endoscopy on 11/16/2011 with findings of moderate gastritis and question atypical duodenal AVMs. There was no evidence of active bleeding. Colonoscopy also on 11/16/2011 showed multiple polyps, hemorrhoids and diverticula. 12. Thrombocytopenia. Stable. 13. Mild leukopenia. Stable. 14. Mildly elevated LDH 11/13/2011. 15. Mildly elevated PT 11/13/2011. 16. 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 There was no evidence of metastatic carcinoma. Storage iron was decreased. Cytogenetic returned with a normal 60 XY karyotype. A molecular FISH panel was negative   Disposition: [name] remains stable from a hematologic standpoint. We instructed him to discontinue oral iron. We will obtain a repeat CBC in 3 months.  We will follow-up on the CEA from today. We made a referral to Dr. Benson Norway, question timing of next colonoscopy.  We also made a referral to dermatology to evaluate the scalp rash.  He will return for labs and an office visit in 6 months. He will contact the office in the interim with any problems.  Plan reviewed with Dr. Benay Spice.    Ned Card ANP/GNP-BC   11/05/2014  10:59 AM

## 2014-11-05 NOTE — Telephone Encounter (Signed)
Gave avs & calendar for June/September. See referral notes.

## 2014-11-06 LAB — CEA: CEA: 2.6 ng/mL (ref 0.0–5.0)

## 2014-11-09 ENCOUNTER — Other Ambulatory Visit: Payer: Self-pay | Admitting: Dermatology

## 2014-11-09 DIAGNOSIS — D1801 Hemangioma of skin and subcutaneous tissue: Secondary | ICD-10-CM | POA: Diagnosis not present

## 2014-11-09 DIAGNOSIS — D692 Other nonthrombocytopenic purpura: Secondary | ICD-10-CM | POA: Diagnosis not present

## 2014-11-09 DIAGNOSIS — D485 Neoplasm of uncertain behavior of skin: Secondary | ICD-10-CM | POA: Diagnosis not present

## 2014-11-09 DIAGNOSIS — L859 Epidermal thickening, unspecified: Secondary | ICD-10-CM | POA: Diagnosis not present

## 2014-11-09 DIAGNOSIS — L218 Other seborrheic dermatitis: Secondary | ICD-10-CM | POA: Diagnosis not present

## 2014-11-09 DIAGNOSIS — L821 Other seborrheic keratosis: Secondary | ICD-10-CM | POA: Diagnosis not present

## 2014-11-09 DIAGNOSIS — L280 Lichen simplex chronicus: Secondary | ICD-10-CM | POA: Diagnosis not present

## 2014-12-10 DIAGNOSIS — H3532 Exudative age-related macular degeneration: Secondary | ICD-10-CM | POA: Diagnosis not present

## 2014-12-14 DIAGNOSIS — L218 Other seborrheic dermatitis: Secondary | ICD-10-CM | POA: Diagnosis not present

## 2014-12-14 DIAGNOSIS — L905 Scar conditions and fibrosis of skin: Secondary | ICD-10-CM | POA: Diagnosis not present

## 2015-01-13 DIAGNOSIS — L821 Other seborrheic keratosis: Secondary | ICD-10-CM | POA: Diagnosis not present

## 2015-01-13 DIAGNOSIS — D225 Melanocytic nevi of trunk: Secondary | ICD-10-CM | POA: Diagnosis not present

## 2015-01-13 DIAGNOSIS — L738 Other specified follicular disorders: Secondary | ICD-10-CM | POA: Diagnosis not present

## 2015-01-13 DIAGNOSIS — D692 Other nonthrombocytopenic purpura: Secondary | ICD-10-CM | POA: Diagnosis not present

## 2015-01-13 DIAGNOSIS — L905 Scar conditions and fibrosis of skin: Secondary | ICD-10-CM | POA: Diagnosis not present

## 2015-01-13 DIAGNOSIS — D1801 Hemangioma of skin and subcutaneous tissue: Secondary | ICD-10-CM | POA: Diagnosis not present

## 2015-02-04 ENCOUNTER — Other Ambulatory Visit (HOSPITAL_BASED_OUTPATIENT_CLINIC_OR_DEPARTMENT_OTHER): Payer: Medicare Other

## 2015-02-04 DIAGNOSIS — Z85038 Personal history of other malignant neoplasm of large intestine: Secondary | ICD-10-CM | POA: Diagnosis present

## 2015-02-04 DIAGNOSIS — D469 Myelodysplastic syndrome, unspecified: Principal | ICD-10-CM

## 2015-02-04 DIAGNOSIS — IMO0001 Reserved for inherently not codable concepts without codable children: Secondary | ICD-10-CM

## 2015-02-04 LAB — CBC WITH DIFFERENTIAL/PLATELET
BASO%: 0 % (ref 0.0–2.0)
Basophils Absolute: 0 10*3/uL (ref 0.0–0.1)
EOS ABS: 0 10*3/uL (ref 0.0–0.5)
EOS%: 0 % (ref 0.0–7.0)
HCT: 38.8 % (ref 38.4–49.9)
HGB: 13.2 g/dL (ref 13.0–17.1)
LYMPH%: 19.6 % (ref 14.0–49.0)
MCH: 29.9 pg (ref 27.2–33.4)
MCHC: 34 g/dL (ref 32.0–36.0)
MCV: 87.8 fL (ref 79.3–98.0)
MONO#: 1.7 10*3/uL — ABNORMAL HIGH (ref 0.1–0.9)
MONO%: 44.6 % — AB (ref 0.0–14.0)
NEUT#: 1.3 10*3/uL — ABNORMAL LOW (ref 1.5–6.5)
NEUT%: 35.8 % — ABNORMAL LOW (ref 39.0–75.0)
Platelets: 57 10*3/uL — ABNORMAL LOW (ref 140–400)
RBC: 4.42 10*6/uL (ref 4.20–5.82)
RDW: 15 % — AB (ref 11.0–14.6)
WBC: 3.7 10*3/uL — ABNORMAL LOW (ref 4.0–10.3)
lymph#: 0.7 10*3/uL — ABNORMAL LOW (ref 0.9–3.3)
nRBC: 0 % (ref 0–0)

## 2015-02-05 ENCOUNTER — Telehealth: Payer: Self-pay | Admitting: *Deleted

## 2015-02-05 NOTE — Telephone Encounter (Signed)
-----   Message from Owens Shark, NP sent at 02/05/2015 10:00 AM EDT ----- Please let him know blood counts are stable. Follow-up as scheduled.

## 2015-02-05 NOTE — Telephone Encounter (Signed)
Called and informed patient that blood counts are stable and to f/u as scheduled.  Per Elby Showers. Marcello Moores, NP.  Patient verbalized understanding.

## 2015-02-08 DIAGNOSIS — H3532 Exudative age-related macular degeneration: Secondary | ICD-10-CM | POA: Diagnosis not present

## 2015-03-17 DIAGNOSIS — L905 Scar conditions and fibrosis of skin: Secondary | ICD-10-CM | POA: Diagnosis not present

## 2015-03-17 DIAGNOSIS — L720 Epidermal cyst: Secondary | ICD-10-CM | POA: Diagnosis not present

## 2015-03-17 DIAGNOSIS — L218 Other seborrheic dermatitis: Secondary | ICD-10-CM | POA: Diagnosis not present

## 2015-04-13 DIAGNOSIS — H3532 Exudative age-related macular degeneration: Secondary | ICD-10-CM | POA: Diagnosis not present

## 2015-05-13 ENCOUNTER — Telehealth: Payer: Self-pay | Admitting: Oncology

## 2015-05-13 ENCOUNTER — Ambulatory Visit (HOSPITAL_BASED_OUTPATIENT_CLINIC_OR_DEPARTMENT_OTHER): Payer: Medicare Other | Admitting: Oncology

## 2015-05-13 ENCOUNTER — Other Ambulatory Visit (HOSPITAL_BASED_OUTPATIENT_CLINIC_OR_DEPARTMENT_OTHER): Payer: Medicare Other

## 2015-05-13 VITALS — BP 132/65 | HR 80 | Temp 99.1°F | Resp 17 | Ht 71.0 in | Wt 190.0 lb

## 2015-05-13 DIAGNOSIS — Q069 Congenital malformation of spinal cord, unspecified: Secondary | ICD-10-CM

## 2015-05-13 DIAGNOSIS — C189 Malignant neoplasm of colon, unspecified: Secondary | ICD-10-CM | POA: Diagnosis not present

## 2015-05-13 DIAGNOSIS — Z23 Encounter for immunization: Secondary | ICD-10-CM

## 2015-05-13 DIAGNOSIS — IMO0001 Reserved for inherently not codable concepts without codable children: Secondary | ICD-10-CM

## 2015-05-13 DIAGNOSIS — D469 Myelodysplastic syndrome, unspecified: Principal | ICD-10-CM

## 2015-05-13 DIAGNOSIS — Z85038 Personal history of other malignant neoplasm of large intestine: Secondary | ICD-10-CM

## 2015-05-13 LAB — CBC WITH DIFFERENTIAL/PLATELET
BASO%: 0 % (ref 0.0–2.0)
BASOS ABS: 0 10*3/uL (ref 0.0–0.1)
EOS ABS: 0 10*3/uL (ref 0.0–0.5)
EOS%: 0 % (ref 0.0–7.0)
HCT: 37.6 % — ABNORMAL LOW (ref 38.4–49.9)
HEMOGLOBIN: 12.7 g/dL — AB (ref 13.0–17.1)
LYMPH%: 13.9 % — ABNORMAL LOW (ref 14.0–49.0)
MCH: 29.4 pg (ref 27.2–33.4)
MCHC: 33.8 g/dL (ref 32.0–36.0)
MCV: 87 fL (ref 79.3–98.0)
MONO#: 1.7 10*3/uL — ABNORMAL HIGH (ref 0.1–0.9)
MONO%: 44.6 % — AB (ref 0.0–14.0)
NEUT#: 1.6 10*3/uL (ref 1.5–6.5)
NEUT%: 41.5 % (ref 39.0–75.0)
PLATELETS: 45 10*3/uL — AB (ref 140–400)
RBC: 4.32 10*6/uL (ref 4.20–5.82)
RDW: 15.1 % — ABNORMAL HIGH (ref 11.0–14.6)
WBC: 3.9 10*3/uL — ABNORMAL LOW (ref 4.0–10.3)
lymph#: 0.5 10*3/uL — ABNORMAL LOW (ref 0.9–3.3)
nRBC: 0 % (ref 0–0)

## 2015-05-13 LAB — HOLD TUBE, BLOOD BANK

## 2015-05-13 MED ORDER — INFLUENZA VAC SPLIT QUAD 0.5 ML IM SUSY
0.5000 mL | PREFILLED_SYRINGE | Freq: Once | INTRAMUSCULAR | Status: AC
  Administered 2015-05-13: 0.5 mL via INTRAMUSCULAR
  Filled 2015-05-13: qty 0.5

## 2015-05-13 NOTE — Telephone Encounter (Signed)
Pt confirmed labs/ov per 09/22 POF, gave pt AVS and Calendar... KJ °

## 2015-05-13 NOTE — Progress Notes (Signed)
  [facility] OFFICE PROGRESS NOTE   Diagnosis: Colon cancer, myelodysplasia  INTERVAL HISTORY:   [name] returns as scheduled. He ambulates with a Walker. He has been evaluated by Dr. Benson Norway for "hemorrhoids ".  Objective:  Vital signs in last 24 hours:  Blood pressure 132/65, pulse 80, temperature 99.1 F (37.3 C), temperature source Oral, resp. rate 17, height $RemoveBe'5\' 11"'DTDtwiveG$  (1.803 m), weight 190 lb (86.183 kg), SpO2 99 %.    HEENT: Neck without mass Lymphatics: No cervical, supraclavicular, axillary, or inguinal nodes Resp: Lungs clear bilaterally Cardio: Regular rate and rhythm GI: No hepatosplenomegaly, no mass Vascular: No leg edema  Skin: Scattered ecchymoses over the arms     Lab Results:  Lab Results  Component Value Date   WBC 3.9* 05/13/2015   HGB 12.7* 05/13/2015   HCT 37.6* 05/13/2015   MCV 87.0 05/13/2015   PLT 45* 05/13/2015   NEUTROABS 1.6 05/13/2015     Lab Results  Component Value Date   CEA 2.6 11/05/2014    Medications: I have reviewed the patient's current medications.  Assessment/Plan: 1. Stage III colon cancer diagnosed in August 2008, status post adjuvant Xeloda chemotherapy, completed in April 2009. He underwent a colonoscopy 11/16/2011 with multiple polyps. 2. History of increased tearing, status post right tear duct stent placement.  3. History of multiple colonic polyps, status post a negative colonoscopy by Dr. Benson Norway in May 2011.  4. Anxiety disorder.  5. Multiple back surgeries.  6. Hypertension.  7. Gastroesophageal reflux disease, status post a Nissen fundoplication.  8. Macular degeneration, followed by Dr. Zadie Rhine.  9. Right ear "tinnitus," and hearing loss followed by Dr. Ernesto Rutherford.  10. Severe microcytic anemia. Ferritin returned low at 7 on 11/13/2011. He was transfused 2 units of blood. Bone marrow biopsy on 11/28/2011 confirmed decreased iron stores. The hemoglobin normalized. He continues oral  iron. 11. Hemoccult positive stool. He underwent an upper endoscopy on 11/16/2011 with findings of moderate gastritis and question atypical duodenal AVMs. There was no evidence of active bleeding. Colonoscopy also on 11/16/2011 showed multiple polyps, hemorrhoids and diverticula. 12. Thrombocytopenia. Stable. 13. Mild leukopenia. Stable. 14. Mildly elevated LDH 11/13/2011. 15. Mildly elevated PT 11/13/2011. 16. 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 There was no evidence of metastatic carcinoma. Storage iron was decreased. Cytogenetic returned with a normal 39 XY karyotype. A molecular FISH panel was negative   Disposition:  [name] remains in clinical remission from colon cancer. He has stable leukopenia and thrombocytopenia secondary to myelodysplasia. He will return for a CBC in 3 months and an office visit in 6 months. We will follow-up on the CEA from today. He received an influenza vaccine today. We will administer the 13 valent pneumococcal vaccine when he returns in 3 months. Johnny Coder, MD  05/13/2015  4:05 PM

## 2015-05-14 ENCOUNTER — Telehealth: Payer: Self-pay | Admitting: *Deleted

## 2015-05-14 ENCOUNTER — Other Ambulatory Visit: Payer: Self-pay | Admitting: *Deleted

## 2015-05-14 LAB — CEA: CEA: 2.1 ng/mL (ref 0.0–5.0)

## 2015-05-14 NOTE — Telephone Encounter (Signed)
-----   Message from Ladell Pier, MD sent at 05/14/2015  9:43 AM EDT ----- Please call patient, [name] is normal

## 2015-05-14 NOTE — Telephone Encounter (Signed)
Per Dr. Benay Spice; left voice message to call office/triage for lab results.  (CEA is normal)

## 2015-05-17 ENCOUNTER — Telehealth: Payer: Self-pay | Admitting: Oncology

## 2015-05-17 NOTE — Progress Notes (Signed)
Patient returned Amy Horton's call.  Notified him the CEA level is normal,  Thanked me for results.Marland Kitchen

## 2015-05-17 NOTE — Telephone Encounter (Signed)
12/21 inj added after lab per pof

## 2015-06-01 DIAGNOSIS — H353211 Exudative age-related macular degeneration, right eye, with active choroidal neovascularization: Secondary | ICD-10-CM | POA: Diagnosis not present

## 2015-06-17 DIAGNOSIS — L218 Other seborrheic dermatitis: Secondary | ICD-10-CM | POA: Diagnosis not present

## 2015-06-17 DIAGNOSIS — D1801 Hemangioma of skin and subcutaneous tissue: Secondary | ICD-10-CM | POA: Diagnosis not present

## 2015-06-17 DIAGNOSIS — L738 Other specified follicular disorders: Secondary | ICD-10-CM | POA: Diagnosis not present

## 2015-06-17 DIAGNOSIS — L718 Other rosacea: Secondary | ICD-10-CM | POA: Diagnosis not present

## 2015-06-21 DIAGNOSIS — Z125 Encounter for screening for malignant neoplasm of prostate: Secondary | ICD-10-CM | POA: Diagnosis not present

## 2015-06-21 DIAGNOSIS — R8290 Unspecified abnormal findings in urine: Secondary | ICD-10-CM | POA: Diagnosis not present

## 2015-06-21 DIAGNOSIS — I10 Essential (primary) hypertension: Secondary | ICD-10-CM | POA: Diagnosis not present

## 2015-06-28 DIAGNOSIS — Z8669 Personal history of other diseases of the nervous system and sense organs: Secondary | ICD-10-CM | POA: Diagnosis not present

## 2015-06-28 DIAGNOSIS — Z1389 Encounter for screening for other disorder: Secondary | ICD-10-CM | POA: Diagnosis not present

## 2015-06-28 DIAGNOSIS — Z0000 Encounter for general adult medical examination without abnormal findings: Secondary | ICD-10-CM | POA: Diagnosis not present

## 2015-06-28 DIAGNOSIS — Z6827 Body mass index (BMI) 27.0-27.9, adult: Secondary | ICD-10-CM | POA: Diagnosis not present

## 2015-06-28 DIAGNOSIS — R3129 Other microscopic hematuria: Secondary | ICD-10-CM | POA: Diagnosis not present

## 2015-06-28 DIAGNOSIS — G4700 Insomnia, unspecified: Secondary | ICD-10-CM | POA: Diagnosis not present

## 2015-06-28 DIAGNOSIS — I10 Essential (primary) hypertension: Secondary | ICD-10-CM | POA: Diagnosis not present

## 2015-06-28 DIAGNOSIS — N183 Chronic kidney disease, stage 3 (moderate): Secondary | ICD-10-CM | POA: Diagnosis not present

## 2015-06-28 DIAGNOSIS — N400 Enlarged prostate without lower urinary tract symptoms: Secondary | ICD-10-CM | POA: Diagnosis not present

## 2015-06-28 DIAGNOSIS — Z23 Encounter for immunization: Secondary | ICD-10-CM | POA: Diagnosis not present

## 2015-06-28 DIAGNOSIS — I493 Ventricular premature depolarization: Secondary | ICD-10-CM | POA: Diagnosis not present

## 2015-06-28 DIAGNOSIS — I129 Hypertensive chronic kidney disease with stage 1 through stage 4 chronic kidney disease, or unspecified chronic kidney disease: Secondary | ICD-10-CM | POA: Diagnosis not present

## 2015-08-10 DIAGNOSIS — H353211 Exudative age-related macular degeneration, right eye, with active choroidal neovascularization: Secondary | ICD-10-CM | POA: Diagnosis not present

## 2015-08-11 ENCOUNTER — Telehealth: Payer: Self-pay | Admitting: *Deleted

## 2015-08-11 ENCOUNTER — Other Ambulatory Visit (HOSPITAL_BASED_OUTPATIENT_CLINIC_OR_DEPARTMENT_OTHER): Payer: Medicare Other

## 2015-08-11 ENCOUNTER — Telehealth: Payer: Self-pay | Admitting: Oncology

## 2015-08-11 ENCOUNTER — Other Ambulatory Visit: Payer: Self-pay | Admitting: *Deleted

## 2015-08-11 ENCOUNTER — Ambulatory Visit: Payer: Medicare Other

## 2015-08-11 DIAGNOSIS — Z23 Encounter for immunization: Secondary | ICD-10-CM

## 2015-08-11 DIAGNOSIS — C189 Malignant neoplasm of colon, unspecified: Secondary | ICD-10-CM

## 2015-08-11 DIAGNOSIS — Z85038 Personal history of other malignant neoplasm of large intestine: Secondary | ICD-10-CM | POA: Diagnosis present

## 2015-08-11 DIAGNOSIS — IMO0001 Reserved for inherently not codable concepts without codable children: Secondary | ICD-10-CM

## 2015-08-11 DIAGNOSIS — D469 Myelodysplastic syndrome, unspecified: Principal | ICD-10-CM

## 2015-08-11 LAB — CBC WITH DIFFERENTIAL/PLATELET
BASO%: 0.4 % (ref 0.0–2.0)
Basophils Absolute: 0 10*3/uL (ref 0.0–0.1)
EOS%: 0.4 % (ref 0.0–7.0)
Eosinophils Absolute: 0 10*3/uL (ref 0.0–0.5)
HCT: 36.4 % — ABNORMAL LOW (ref 38.4–49.9)
HGB: 12.1 g/dL — ABNORMAL LOW (ref 13.0–17.1)
LYMPH%: 31 % (ref 14.0–49.0)
MCH: 28.7 pg (ref 27.2–33.4)
MCHC: 33.2 g/dL (ref 32.0–36.0)
MCV: 86.5 fL (ref 79.3–98.0)
MONO#: 1.2 10*3/uL — ABNORMAL HIGH (ref 0.1–0.9)
MONO%: 41.6 % — ABNORMAL HIGH (ref 0.0–14.0)
NEUT#: 0.8 10*3/uL — ABNORMAL LOW (ref 1.5–6.5)
NEUT%: 26.6 % — ABNORMAL LOW (ref 39.0–75.0)
Platelets: 60 10*3/uL — ABNORMAL LOW (ref 140–400)
RBC: 4.21 10*6/uL (ref 4.20–5.82)
RDW: 15.2 % — ABNORMAL HIGH (ref 11.0–14.6)
WBC: 2.8 10*3/uL — AB (ref 4.0–10.3)
lymph#: 0.9 10*3/uL (ref 0.9–3.3)
nRBC: 0 % (ref 0–0)

## 2015-08-11 MED ORDER — PNEUMOCOCCAL 13-VAL CONJ VACC IM SUSP
0.5000 mL | Freq: Once | INTRAMUSCULAR | Status: AC
  Filled 2015-08-11: qty 0.5

## 2015-08-11 NOTE — Telephone Encounter (Signed)
Per Dr. Benay Spice; notified pt that neutrophils/wbc are low and to call if any fever/shaking chills, etc; will re-check cbc in 6 weeks.  Pt verbalized understanding and expressed appreciation for call.

## 2015-08-11 NOTE — Telephone Encounter (Signed)
-----   Message from Ladell Pier, MD sent at 08/11/2015 12:17 PM EST ----- Please call patient, neutrophils are low, call for fever, check cbc 6 weeks

## 2015-08-11 NOTE — Patient Instructions (Signed)
Pneumococcal Vaccine, Polyvalent suspension for injection  What is this medicine?  PNEUMOCOCCAL VACCINE (NEU mo KOK al vak SEEN) is a vaccine used to prevent pneumococcus bacterial infections. These bacteria can cause serious infections like pneumonia, meningitis, and blood infections. This vaccine will lower your chance of getting pneumonia. If you do get pneumonia, it can make your symptoms milder and your illness shorter. This vaccine will not treat an infection and will not cause infection. This vaccine is recommended for infants and young children, adults with certain medical conditions, and adults 65 years or older.  This medicine may be used for other purposes; ask your health care provider or pharmacist if you have questions.  What should I tell my health care provider before I take this medicine?  They need to know if you have any of these conditions:  -bleeding problems  -fever  -immune system problems  -an unusual or allergic reaction to pneumococcal vaccine, diphtheria toxoid, other vaccines, latex, other medicines, foods, dyes, or preservatives  -pregnant or trying to get pregnant  -breast-feeding  How should I use this medicine?  This vaccine is for injection into a muscle. It is given by a health care professional.  A copy of Vaccine Information Statements will be given before each vaccination. Read this sheet carefully each time. The sheet may change frequently.  Talk to your pediatrician regarding the use of this medicine in children. While this drug may be prescribed for children as young as 6 weeks old for selected conditions, precautions do apply.  Overdosage: If you think you have taken too much of this medicine contact a poison control center or emergency room at once.  NOTE: This medicine is only for you. Do not share this medicine with others.  What if I miss a dose?  It is important not to miss your dose. Call your doctor or health care professional if you are unable to keep an  appointment.  What may interact with this medicine?  -medicines for cancer chemotherapy  -medicines that suppress your immune function  -steroid medicines like prednisone or cortisone  This list may not describe all possible interactions. Give your health care provider a list of all the medicines, herbs, non-prescription drugs, or dietary supplements you use. Also tell them if you smoke, drink alcohol, or use illegal drugs. Some items may interact with your medicine.  What should I watch for while using this medicine?  Mild fever and pain should go away in 3 days or less. Report any unusual symptoms to your doctor or health care professional.  What side effects may I notice from receiving this medicine?  Side effects that you should report to your doctor or health care professional as soon as possible:  -allergic reactions like skin rash, itching or hives, swelling of the face, lips, or tongue  -breathing problems  -confused  -fast or irregular heartbeat  -fever over 102 degrees F  -seizures  -unusual bleeding or bruising  -unusual muscle weakness  Side effects that usually do not require medical attention (report to your doctor or health care professional if they continue or are bothersome):  -aches and pains  -diarrhea  -fever of 102 degrees F or less  -headache  -irritable  -loss of appetite  -pain, tender at site where injected  -trouble sleeping  This list may not describe all possible side effects. Call your doctor for medical advice about side effects. You may report side effects to FDA at 1-800-FDA-1088.  Where should I keep   my medicine?  This does not apply. This vaccine is given in a clinic, pharmacy, doctor's office, or other health care setting and will not be stored at home.  NOTE: This sheet is a summary. It may not cover all possible information. If you have questions about this medicine, talk to your doctor, pharmacist, or health care provider.     © 2016, [employer]vier/[employer]. (2014-05-14  10:27:27)

## 2015-08-11 NOTE — Progress Notes (Signed)
Pt entered clinic for Prevnar 13 injection. Pt stated that he has already had the injection at his primary care  Dr. Ardeth Perfect. Injection held today, pt instructed to  Bring copy or have Dr. Joylene Igo copy of injection  Type, date to Dr. Bernette Redbird office.Marland Kitchen

## 2015-08-11 NOTE — Telephone Encounter (Signed)
per pof to sch pt appt-cld & spoke to Delores(sister) to adv of appt time & date for 09/22/15 lab appt

## 2015-09-22 ENCOUNTER — Other Ambulatory Visit (HOSPITAL_BASED_OUTPATIENT_CLINIC_OR_DEPARTMENT_OTHER): Payer: Medicare Other

## 2015-09-22 ENCOUNTER — Telehealth: Payer: Self-pay | Admitting: *Deleted

## 2015-09-22 DIAGNOSIS — IMO0001 Reserved for inherently not codable concepts without codable children: Secondary | ICD-10-CM

## 2015-09-22 DIAGNOSIS — D469 Myelodysplastic syndrome, unspecified: Principal | ICD-10-CM

## 2015-09-22 DIAGNOSIS — Z85038 Personal history of other malignant neoplasm of large intestine: Secondary | ICD-10-CM

## 2015-09-22 LAB — CBC WITH DIFFERENTIAL/PLATELET
BASO%: 0.4 % (ref 0.0–2.0)
BASOS ABS: 0 10*3/uL (ref 0.0–0.1)
EOS ABS: 0 10*3/uL (ref 0.0–0.5)
EOS%: 0.2 % (ref 0.0–7.0)
HEMATOCRIT: 38.1 % — AB (ref 38.4–49.9)
HEMOGLOBIN: 12.4 g/dL — AB (ref 13.0–17.1)
LYMPH#: 0.7 10*3/uL — AB (ref 0.9–3.3)
LYMPH%: 21.9 % (ref 14.0–49.0)
MCH: 28.4 pg (ref 27.2–33.4)
MCHC: 32.6 g/dL (ref 32.0–36.0)
MCV: 87.1 fL (ref 79.3–98.0)
MONO#: 1.4 10*3/uL — AB (ref 0.1–0.9)
MONO%: 40.9 % — AB (ref 0.0–14.0)
NEUT%: 36.6 % — ABNORMAL LOW (ref 39.0–75.0)
NEUTROS ABS: 1.2 10*3/uL — AB (ref 1.5–6.5)
PLATELETS: 57 10*3/uL — AB (ref 140–400)
RBC: 4.37 10*6/uL (ref 4.20–5.82)
RDW: 15.8 % — AB (ref 11.0–14.6)
WBC: 3.4 10*3/uL — AB (ref 4.0–10.3)

## 2015-09-22 NOTE — Telephone Encounter (Signed)
-----   Message from Ladell Pier, MD sent at 09/22/2015  2:28 PM EST ----- Please call patient, cbc is stable, f/u as scheduled

## 2015-09-22 NOTE — Telephone Encounter (Signed)
Called pt with CBC results. Stable, per Dr. Benay Spice. Follow up as scheduled. Pt voiced understanding.

## 2015-10-26 DIAGNOSIS — H353211 Exudative age-related macular degeneration, right eye, with active choroidal neovascularization: Secondary | ICD-10-CM | POA: Diagnosis not present

## 2015-11-11 ENCOUNTER — Telehealth: Payer: Self-pay | Admitting: Oncology

## 2015-11-11 ENCOUNTER — Other Ambulatory Visit (HOSPITAL_BASED_OUTPATIENT_CLINIC_OR_DEPARTMENT_OTHER): Payer: Medicare Other

## 2015-11-11 ENCOUNTER — Ambulatory Visit (HOSPITAL_BASED_OUTPATIENT_CLINIC_OR_DEPARTMENT_OTHER): Payer: Medicare Other | Admitting: Oncology

## 2015-11-11 VITALS — BP 152/61 | HR 56 | Temp 97.7°F | Resp 17 | Ht 71.0 in | Wt 186.8 lb

## 2015-11-11 DIAGNOSIS — IMO0001 Reserved for inherently not codable concepts without codable children: Secondary | ICD-10-CM

## 2015-11-11 DIAGNOSIS — C189 Malignant neoplasm of colon, unspecified: Secondary | ICD-10-CM

## 2015-11-11 DIAGNOSIS — D469 Myelodysplastic syndrome, unspecified: Secondary | ICD-10-CM

## 2015-11-11 DIAGNOSIS — D61818 Other pancytopenia: Secondary | ICD-10-CM | POA: Diagnosis not present

## 2015-11-11 DIAGNOSIS — Q069 Congenital malformation of spinal cord, unspecified: Secondary | ICD-10-CM | POA: Diagnosis not present

## 2015-11-11 DIAGNOSIS — Z85038 Personal history of other malignant neoplasm of large intestine: Secondary | ICD-10-CM | POA: Diagnosis not present

## 2015-11-11 DIAGNOSIS — D696 Thrombocytopenia, unspecified: Secondary | ICD-10-CM

## 2015-11-11 LAB — CBC WITH DIFFERENTIAL/PLATELET
BASO%: 0 % (ref 0.0–2.0)
BASOS ABS: 0 10*3/uL (ref 0.0–0.1)
EOS%: 0 % (ref 0.0–7.0)
Eosinophils Absolute: 0 10*3/uL (ref 0.0–0.5)
HCT: 35.1 % — ABNORMAL LOW (ref 38.4–49.9)
HGB: 11.8 g/dL — ABNORMAL LOW (ref 13.0–17.1)
LYMPH%: 26.2 % (ref 14.0–49.0)
MCH: 28.9 pg (ref 27.2–33.4)
MCHC: 33.6 g/dL (ref 32.0–36.0)
MCV: 85.8 fL (ref 79.3–98.0)
MONO#: 1.2 10*3/uL — ABNORMAL HIGH (ref 0.1–0.9)
MONO%: 47.3 % — AB (ref 0.0–14.0)
NEUT%: 26.5 % — ABNORMAL LOW (ref 39.0–75.0)
NEUTROS ABS: 0.7 10*3/uL — AB (ref 1.5–6.5)
NRBC: 0 % (ref 0–0)
PLATELETS: 51 10*3/uL — AB (ref 140–400)
RBC: 4.09 10*6/uL — AB (ref 4.20–5.82)
RDW: 14.9 % — AB (ref 11.0–14.6)
WBC: 2.6 10*3/uL — ABNORMAL LOW (ref 4.0–10.3)
lymph#: 0.7 10*3/uL — ABNORMAL LOW (ref 0.9–3.3)

## 2015-11-11 NOTE — Telephone Encounter (Signed)
Gave and printed appt sched and avs for pt for may and July  °

## 2015-11-11 NOTE — Telephone Encounter (Signed)
Gave and printed appt sched and avs for pt for May July

## 2015-11-11 NOTE — Progress Notes (Signed)
  [name] OFFICE PROGRESS NOTE   Diagnosis: Colon cancer, myelodysplasia  INTERVAL HISTORY:   [name] returns as scheduled. He complains of "leg weakness ". He has been staying in the house because of the low white count. No recent infection. No bleeding.  Objective:  Vital signs in last 24 hours:  Blood pressure 152/61, pulse 56, temperature 97.7 F (36.5 C), temperature source Oral, resp. rate 17, height '5\' 11"'$  (1.803 m), weight 186 lb 12.8 oz (84.732 kg), SpO2 100 %.    HEENT: Oral cavity without bleeding or thrush Lymphatics: No cervical, supra-clavicular, axillary, or inguinal nodes Resp: Lungs clear bilaterally Cardio: Frequent premature beats GI: No hepatosplenomegaly Vascular: No leg edema Neuro: The leg strength appears intact bilaterally, he ambulated to the examination table    Lab Results:  Lab Results  Component Value Date   WBC 2.6* 11/11/2015   HGB 11.8* 11/11/2015   HCT 35.1* 11/11/2015   MCV 85.8 11/11/2015   PLT 51* 11/11/2015   NEUTROABS 0.7* 11/11/2015     Medications: I have reviewed the patient's current medications.  Assessment/Plan: 1. Stage III colon cancer diagnosed in August 2008, status post adjuvant Xeloda chemotherapy, completed in April 2009. He underwent a colonoscopy 11/16/2011 with multiple polyps. 2. History of increased tearing, status post right tear duct stent placement.  3. History of multiple colonic polyps, status post a negative colonoscopy by Dr. Benson Norway in May 2011.  4. Anxiety disorder.  5. Multiple back surgeries.  6. Hypertension.  7. Gastroesophageal reflux disease, status post a Nissen fundoplication.  8. Macular degeneration, followed by Dr. Zadie Rhine.  9. Right ear "tinnitus," and hearing loss followed by Dr. Ernesto Rutherford.  10. Severe microcytic anemia. Ferritin returned low at 7 on 11/13/2011. He was transfused 2 units of blood. Bone marrow biopsy on 11/28/2011 confirmed decreased iron stores.  The hemoglobin normalized. He continues oral iron. 11. Hemoccult positive stool. He underwent an upper endoscopy on 11/16/2011 with findings of moderate gastritis and question atypical duodenal AVMs. There was no evidence of active bleeding. Colonoscopy also on 11/16/2011 showed multiple polyps, hemorrhoids and diverticula. 12. Thrombocytopenia. Stable. 13. Mild leukopenia. Stable. 14. Mildly elevated LDH 11/13/2011. 15. Mildly elevated PT 11/13/2011. 16. 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 There was no evidence of metastatic carcinoma. Storage iron was decreased. Cytogenetic returned with a normal 22 XY karyotype. A molecular FISH panel was negative  Disposition:  [name] remains in clinical remission from colon cancer. He has stable pancytopenia secondary to myelodysplasia. He knows to contact us for a fever, bleeding, or symptoms of an infection. [name] will return for a CBC in 2 months and an office visit in 4 months.   Betsy Coder, MD  11/11/2015  10:53 AM

## 2015-11-12 LAB — CEA: CEA1: 3.2 ng/mL (ref 0.0–4.7)

## 2015-11-12 LAB — CEA (PARALLEL TESTING): CEA: 2.1 ng/mL

## 2015-11-26 DIAGNOSIS — I491 Atrial premature depolarization: Secondary | ICD-10-CM | POA: Diagnosis not present

## 2015-11-26 DIAGNOSIS — M79621 Pain in right upper arm: Secondary | ICD-10-CM | POA: Diagnosis not present

## 2015-11-26 DIAGNOSIS — S299XXA Unspecified injury of thorax, initial encounter: Secondary | ICD-10-CM | POA: Diagnosis not present

## 2015-11-26 DIAGNOSIS — M25512 Pain in left shoulder: Secondary | ICD-10-CM | POA: Diagnosis not present

## 2015-11-26 DIAGNOSIS — M25522 Pain in left elbow: Secondary | ICD-10-CM | POA: Diagnosis not present

## 2015-11-26 DIAGNOSIS — Z6825 Body mass index (BMI) 25.0-25.9, adult: Secondary | ICD-10-CM | POA: Diagnosis not present

## 2015-11-26 DIAGNOSIS — N183 Chronic kidney disease, stage 3 (moderate): Secondary | ICD-10-CM | POA: Diagnosis not present

## 2015-11-26 DIAGNOSIS — R000 Tachycardia, unspecified: Secondary | ICD-10-CM | POA: Diagnosis not present

## 2015-11-26 DIAGNOSIS — D61818 Other pancytopenia: Secondary | ICD-10-CM | POA: Diagnosis not present

## 2015-11-26 DIAGNOSIS — W1830XA Fall on same level, unspecified, initial encounter: Secondary | ICD-10-CM | POA: Diagnosis not present

## 2015-12-20 ENCOUNTER — Encounter: Payer: Self-pay | Admitting: Cardiology

## 2015-12-20 ENCOUNTER — Ambulatory Visit (INDEPENDENT_AMBULATORY_CARE_PROVIDER_SITE_OTHER): Payer: Medicare Other | Admitting: Cardiology

## 2015-12-20 VITALS — BP 124/70 | HR 100 | Ht 71.0 in | Wt 185.4 lb

## 2015-12-20 DIAGNOSIS — R9431 Abnormal electrocardiogram [ECG] [EKG]: Secondary | ICD-10-CM | POA: Diagnosis not present

## 2015-12-20 DIAGNOSIS — R0789 Other chest pain: Secondary | ICD-10-CM

## 2015-12-20 DIAGNOSIS — D469 Myelodysplastic syndrome, unspecified: Secondary | ICD-10-CM

## 2015-12-20 DIAGNOSIS — I491 Atrial premature depolarization: Secondary | ICD-10-CM

## 2015-12-20 DIAGNOSIS — IMO0001 Reserved for inherently not codable concepts without codable children: Secondary | ICD-10-CM

## 2015-12-20 DIAGNOSIS — Q069 Congenital malformation of spinal cord, unspecified: Secondary | ICD-10-CM

## 2015-12-20 MED ORDER — METOPROLOL TARTRATE 25 MG PO TABS: 25.0000 mg | ORAL_TABLET | Freq: Two times a day (BID) | ORAL | Status: DC

## 2015-12-20 NOTE — Patient Instructions (Signed)
Medication Instructions:  Please start Metoprolol tartrate 25 mg twice a day. Continue all other medications as listed.  Follow-Up: Follow up in 3 months with Dr Marlou Porch.  If you need a refill on your cardiac medications before your next appointment, please call your pharmacy.  Thank you for choosing [facility]!!
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
## 2015-12-21 DIAGNOSIS — H35363 Drusen (degenerative) of macula, bilateral: Secondary | ICD-10-CM | POA: Diagnosis not present

## 2015-12-21 DIAGNOSIS — H353132 Nonexudative age-related macular degeneration, bilateral, intermediate dry stage: Secondary | ICD-10-CM | POA: Diagnosis not present

## 2015-12-21 DIAGNOSIS — H353211 Exudative age-related macular degeneration, right eye, with active choroidal neovascularization: Secondary | ICD-10-CM | POA: Diagnosis not present

## 2015-12-21 DIAGNOSIS — H35431 Paving stone degeneration of retina, right eye: Secondary | ICD-10-CM | POA: Diagnosis not present

## 2015-12-21 DIAGNOSIS — H04229 Epiphora due to insufficient drainage, unspecified lacrimal gland: Secondary | ICD-10-CM | POA: Diagnosis not present

## 2015-12-21 DIAGNOSIS — H35433 Paving stone degeneration of retina, bilateral: Secondary | ICD-10-CM | POA: Diagnosis not present

## 2016-01-06 ENCOUNTER — Telehealth: Payer: Self-pay | Admitting: Oncology

## 2016-01-06 ENCOUNTER — Telehealth: Payer: Self-pay | Admitting: *Deleted

## 2016-01-06 ENCOUNTER — Other Ambulatory Visit (HOSPITAL_BASED_OUTPATIENT_CLINIC_OR_DEPARTMENT_OTHER): Payer: Medicare Other

## 2016-01-06 DIAGNOSIS — IMO0001 Reserved for inherently not codable concepts without codable children: Secondary | ICD-10-CM

## 2016-01-06 DIAGNOSIS — D469 Myelodysplastic syndrome, unspecified: Secondary | ICD-10-CM

## 2016-01-06 LAB — CBC WITH DIFFERENTIAL/PLATELET
BASO%: 0.3 % (ref 0.0–2.0)
BASOS ABS: 0 10*3/uL (ref 0.0–0.1)
EOS ABS: 0 10*3/uL (ref 0.0–0.5)
EOS%: 0 % (ref 0.0–7.0)
HCT: 32 % — ABNORMAL LOW (ref 38.4–49.9)
HEMOGLOBIN: 10.5 g/dL — AB (ref 13.0–17.1)
LYMPH#: 0.5 10*3/uL — AB (ref 0.9–3.3)
LYMPH%: 14.2 % (ref 14.0–49.0)
MCH: 28.6 pg (ref 27.2–33.4)
MCHC: 32.8 g/dL (ref 32.0–36.0)
MCV: 87.2 fL (ref 79.3–98.0)
MONO#: 2 10*3/uL — ABNORMAL HIGH (ref 0.1–0.9)
MONO%: 54.7 % — ABNORMAL HIGH (ref 0.0–14.0)
NEUT%: 30.8 % — ABNORMAL LOW (ref 39.0–75.0)
NEUTROS ABS: 1.2 10*3/uL — AB (ref 1.5–6.5)
NRBC: 0 % (ref 0–0)
PLATELETS: 32 10*3/uL — AB (ref 140–400)
RBC: 3.67 10*6/uL — ABNORMAL LOW (ref 4.20–5.82)
RDW: 15.9 % — AB (ref 11.0–14.6)
WBC: 3.7 10*3/uL — AB (ref 4.0–10.3)

## 2016-01-06 NOTE — Telephone Encounter (Signed)
cld & spoke to pt and gave pt time & date of appt for 6/15@10 :30

## 2016-01-06 NOTE — Telephone Encounter (Signed)
-----   Message from Ladell Pier, MD sent at 01/06/2016 10:38 AM EDT ----- Please call patient, hemoglobin slightly lower, move office and lab visit to 1 month from now, call for bleeding

## 2016-01-06 NOTE — Telephone Encounter (Signed)
Called pt with lab results. Instructed him to call office for any bleeding. He reports scant amounts of blood from hemorrhoid on paper after BM. Order to schedulers for appt.

## 2016-01-24 ENCOUNTER — Telehealth: Payer: Self-pay

## 2016-01-24 NOTE — Telephone Encounter (Signed)
Sister in law called at pt request. Asking if he should make appt with Dr Benay Spice or Dr Benson Norway. He has hemorhoids and has blood on tissue when he wipes. Noted a telephone call on  /18 where he noted blood on tissue when he wipes and OV made for 6/15. Asked Deloris to tell pt that if it is about the same since that call we will keep appt as it is. If the blood is worsening or he has increased fatigue or SOB we can move the appt to sooner.

## 2016-01-24 NOTE — Telephone Encounter (Signed)
Call placed to check on patient.  He states that the bleeding he is having after wiping is unchanged.  He denies any SOB and states that his energy level is decreased, which is not new for him.  Asked patient if he would like to come in sooner than 02/03/16 and he stated that he will keep his appt for 02/03/16.  Pt encouraged to call office if he has any increase in bleeding or further complaints.

## 2016-02-03 ENCOUNTER — Ambulatory Visit (HOSPITAL_BASED_OUTPATIENT_CLINIC_OR_DEPARTMENT_OTHER): Payer: Medicare Other | Admitting: Oncology

## 2016-02-03 ENCOUNTER — Other Ambulatory Visit (HOSPITAL_BASED_OUTPATIENT_CLINIC_OR_DEPARTMENT_OTHER): Payer: Medicare Other

## 2016-02-03 ENCOUNTER — Telehealth: Payer: Self-pay | Admitting: Oncology

## 2016-02-03 VITALS — BP 137/59 | HR 54 | Temp 98.3°F | Resp 17 | Ht 71.0 in | Wt 188.7 lb

## 2016-02-03 DIAGNOSIS — Z85038 Personal history of other malignant neoplasm of large intestine: Secondary | ICD-10-CM | POA: Diagnosis not present

## 2016-02-03 DIAGNOSIS — D469 Myelodysplastic syndrome, unspecified: Secondary | ICD-10-CM

## 2016-02-03 DIAGNOSIS — IMO0001 Reserved for inherently not codable concepts without codable children: Secondary | ICD-10-CM

## 2016-02-03 DIAGNOSIS — D696 Thrombocytopenia, unspecified: Secondary | ICD-10-CM

## 2016-02-03 LAB — CBC WITH DIFFERENTIAL/PLATELET
BASO%: 0 % (ref 0.0–2.0)
BASOS ABS: 0 10*3/uL (ref 0.0–0.1)
EOS ABS: 0 10*3/uL (ref 0.0–0.5)
EOS%: 0.3 % (ref 0.0–7.0)
HEMATOCRIT: 32 % — AB (ref 38.4–49.9)
HEMOGLOBIN: 10.6 g/dL — AB (ref 13.0–17.1)
LYMPH#: 0.6 10*3/uL — AB (ref 0.9–3.3)
LYMPH%: 16.3 % (ref 14.0–49.0)
MCH: 29.2 pg (ref 27.2–33.4)
MCHC: 33.1 g/dL (ref 32.0–36.0)
MCV: 88.2 fL (ref 79.3–98.0)
MONO#: 1.7 10*3/uL — AB (ref 0.1–0.9)
MONO%: 46.7 % — ABNORMAL HIGH (ref 0.0–14.0)
NEUT#: 1.4 10*3/uL — ABNORMAL LOW (ref 1.5–6.5)
NEUT%: 36.7 % — ABNORMAL LOW (ref 39.0–75.0)
NRBC: 0 % (ref 0–0)
Platelets: 31 10*3/uL — ABNORMAL LOW (ref 140–400)
RBC: 3.63 10*6/uL — ABNORMAL LOW (ref 4.20–5.82)
RDW: 16.4 % — AB (ref 11.0–14.6)
WBC: 3.7 10*3/uL — ABNORMAL LOW (ref 4.0–10.3)

## 2016-02-03 NOTE — Telephone Encounter (Signed)
Gave pt apt & avs °

## 2016-02-03 NOTE — Progress Notes (Signed)
  [facility] OFFICE PROGRESS NOTE   Diagnosis: Colon cancer, myelodysplasia  INTERVAL HISTORY:   [name] returns as scheduled. No fever. He bruises easily. He had episodes of blood on the toilet paper, this has resolved. He ambulates with a walker. He reports edema of the legs.  Objective:  Vital signs in last 24 hours:  Blood pressure 137/59, pulse 54, temperature 98.3 F (36.8 C), temperature source Oral, resp. rate 17, height _0  (1.803 m), weight 188 lb 11.2 oz (85.594 kg), SpO2 100 %.    HEENT: [place] over the tongue, no buccal thrush, no bleeding Lymphatics: No cervical, supra-clavicular, axillary, or inguinal nodes Resp: Coarse rhonchi at the right greater than left lower posterior chest, no respiratory distress Cardio: Regular rate and rhythm GI: No hepatosplenic the, no mass, nontender Vascular: Pitting edema at the right radial the left lower leg and ankle  Skin: Scattered ecchymoses   Portacath/PICC-without erythema  Lab Results:  Lab Results  Component Value Date   WBC 3.7* 02/03/2016   HGB 10.6* 02/03/2016   HCT 32.0* 02/03/2016   MCV 88.2 02/03/2016   PLT 31* 02/03/2016   NEUTROABS 1.4* 02/03/2016      Lab Results  Component Value Date   CEA1 3.2 11/11/2015     Medications: I have reviewed the patient's current medications.  Assessment/Plan: 1. Stage III colon cancer diagnosed in August 2008, status post adjuvant Xeloda chemotherapy, completed in April 2009. He underwent a colonoscopy 11/16/2011 with multiple polyps. 2. History of increased tearing, status post right tear duct stent placement.  3. History of multiple colonic polyps, status post a negative colonoscopy by Dr. Benson Norway in May 2011.  4. Anxiety disorder.  5. Multiple back surgeries.  6. Hypertension.  7. Gastroesophageal reflux disease, status post a Nissen fundoplication.  8. Macular degeneration, followed by Dr. Zadie Rhine.  9. Right ear "tinnitus," and  hearing loss followed by Dr. Ernesto Rutherford.  10. Severe microcytic anemia. Ferritin returned low at 7 on 11/13/2011. He was transfused 2 units of blood. Bone marrow biopsy on 11/28/2011 confirmed decreased iron stores. The hemoglobin normalized. He continues oral iron. 11. Hemoccult positive stool. He underwent an upper endoscopy on 11/16/2011 with findings of moderate gastritis and question atypical duodenal AVMs. There was no evidence of active bleeding. Colonoscopy also on 11/16/2011 showed multiple polyps, hemorrhoids and diverticula. 12. Thrombocytopenia. Stable. 13. Mild leukopenia. Stable. 14. Mildly elevated LDH 11/13/2011. 15. Mildly elevated PT 11/13/2011. 16. 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 There was no evidence of metastatic carcinoma. Storage iron was decreased. Cytogenetic returned with a normal 62 XY karyotype. A molecular FISH panel was negative   Disposition:  [name] appears stable. The hemoglobin and platelet count are slightly lower over the past 6 months. He will be referred for a restaging bone marrow biopsy. He will return for an office visit after the bone marrow biopsy to discuss treatment of the myelodysplasia.  Betsy Coder, MD  02/03/2016  11:21 AM

## 2016-02-10 ENCOUNTER — Encounter: Payer: Self-pay | Admitting: *Deleted

## 2016-02-10 ENCOUNTER — Other Ambulatory Visit: Payer: Self-pay | Admitting: *Deleted

## 2016-02-10 NOTE — Progress Notes (Signed)
Patient's son Thayer Jew called inquiring about patients appointments on 02/25/16 and 03/02/16?  Will f/u with radiology tomorrow to confirm BM biopsy date and get back with patients son with correct appointment time.

## 2016-02-11 ENCOUNTER — Encounter: Payer: Self-pay | Admitting: *Deleted

## 2016-02-11 ENCOUNTER — Other Ambulatory Visit: Payer: Self-pay | Admitting: *Deleted

## 2016-02-11 NOTE — Progress Notes (Signed)
BM biopsy appt set up this AM per scheduling.  Spoke with patients son, Thayer Jew and he would like to cancel duplicate appointments on 02/25/16 and keep appointments on 03/02/16.

## 2016-02-16 ENCOUNTER — Other Ambulatory Visit: Payer: Self-pay | Admitting: Radiology

## 2016-02-17 ENCOUNTER — Other Ambulatory Visit: Payer: Self-pay | Admitting: Radiology

## 2016-02-18 ENCOUNTER — Ambulatory Visit (HOSPITAL_COMMUNITY)
Admission: RE | Admit: 2016-02-18 | Discharge: 2016-02-18 | Disposition: A | Discharge status: Home / Self Care | Payer: Medicare Other | Source: Ambulatory Visit | Attending: Oncology | Admitting: Oncology

## 2016-02-18 ENCOUNTER — Other Ambulatory Visit: Payer: Self-pay | Admitting: Nurse Practitioner

## 2016-02-18 ENCOUNTER — Encounter (HOSPITAL_COMMUNITY): Payer: Self-pay

## 2016-02-18 DIAGNOSIS — H3530 Unspecified macular degeneration: Secondary | ICD-10-CM | POA: Insufficient documentation

## 2016-02-18 DIAGNOSIS — R05 Cough: Secondary | ICD-10-CM | POA: Insufficient documentation

## 2016-02-18 DIAGNOSIS — Z85038 Personal history of other malignant neoplasm of large intestine: Secondary | ICD-10-CM | POA: Diagnosis not present

## 2016-02-18 DIAGNOSIS — D61818 Other pancytopenia: Secondary | ICD-10-CM | POA: Diagnosis not present

## 2016-02-18 DIAGNOSIS — D469 Myelodysplastic syndrome, unspecified: Secondary | ICD-10-CM | POA: Diagnosis not present

## 2016-02-18 DIAGNOSIS — I10 Essential (primary) hypertension: Secondary | ICD-10-CM | POA: Diagnosis not present

## 2016-02-18 DIAGNOSIS — Z888 Allergy status to other drugs, medicaments and biological substances status: Secondary | ICD-10-CM | POA: Insufficient documentation

## 2016-02-18 DIAGNOSIS — R710 Precipitous drop in hematocrit: Secondary | ICD-10-CM | POA: Diagnosis not present

## 2016-02-18 DIAGNOSIS — M549 Dorsalgia, unspecified: Secondary | ICD-10-CM | POA: Insufficient documentation

## 2016-02-18 DIAGNOSIS — Z79899 Other long term (current) drug therapy: Secondary | ICD-10-CM | POA: Diagnosis not present

## 2016-02-18 DIAGNOSIS — IMO0001 Reserved for inherently not codable concepts without codable children: Secondary | ICD-10-CM

## 2016-02-18 DIAGNOSIS — R5383 Other fatigue: Secondary | ICD-10-CM | POA: Diagnosis not present

## 2016-02-18 LAB — CBC WITH DIFFERENTIAL/PLATELET
BASOS ABS: 0 10*3/uL (ref 0.0–0.1)
BASOS PCT: 0 %
EOS PCT: 0 %
Eosinophils Absolute: 0 10*3/uL (ref 0.0–0.7)
HEMATOCRIT: 31.8 % — AB (ref 39.0–52.0)
Hemoglobin: 10.9 g/dL — ABNORMAL LOW (ref 13.0–17.0)
Lymphocytes Relative: 23 %
Lymphs Abs: 0.8 10*3/uL (ref 0.7–4.0)
MCH: 29.2 pg (ref 26.0–34.0)
MCHC: 34.3 g/dL (ref 30.0–36.0)
MCV: 85.3 fL (ref 78.0–100.0)
MONO ABS: 1.3 10*3/uL — AB (ref 0.1–1.0)
Monocytes Relative: 40 %
NEUTROS ABS: 1.2 10*3/uL — AB (ref 1.7–7.7)
Neutrophils Relative %: 37 %
PLATELETS: 76 10*3/uL — AB (ref 150–400)
RBC: 3.73 MIL/uL — ABNORMAL LOW (ref 4.22–5.81)
RDW: 15.9 % — AB (ref 11.5–15.5)
WBC: 3.3 10*3/uL — ABNORMAL LOW (ref 4.0–10.5)

## 2016-02-18 LAB — PROTIME-INR
INR: 1.2 (ref 0.00–1.49)
Prothrombin Time: 14.9 seconds (ref 11.6–15.2)

## 2016-02-18 LAB — BASIC METABOLIC PANEL
ANION GAP: 8 (ref 5–15)
BUN: 23 mg/dL — ABNORMAL HIGH (ref 6–20)
CALCIUM: 9 mg/dL (ref 8.9–10.3)
CO2: 22 mmol/L (ref 22–32)
Chloride: 108 mmol/L (ref 101–111)
Creatinine, Ser: 2.19 mg/dL — ABNORMAL HIGH (ref 0.61–1.24)
GFR calc Af Amer: 31 mL/min — ABNORMAL LOW (ref >60)
GFR, EST NON AFRICAN AMERICAN: 27 mL/min — AB (ref >60)
GLUCOSE: 101 mg/dL — AB (ref 65–99)
Potassium: 4 mmol/L (ref 3.5–5.1)
SODIUM: 138 mmol/L (ref 135–145)

## 2016-02-18 LAB — BONE MARROW EXAM

## 2016-02-18 MED ORDER — FENTANYL CITRATE (PF) 100 MCG/2ML IJ SOLN
INTRAMUSCULAR | Status: AC | PRN
  Administered 2016-02-18: 25 ug via INTRAVENOUS
  Administered 2016-02-18: 50 ug via INTRAVENOUS

## 2016-02-18 MED ORDER — FENTANYL CITRATE (PF) 100 MCG/2ML IJ SOLN
INTRAMUSCULAR | Status: AC
  Filled 2016-02-18: qty 4

## 2016-02-18 MED ORDER — MIDAZOLAM HCL 2 MG/2ML IJ SOLN
INTRAMUSCULAR | Status: AC
  Filled 2016-02-18: qty 6

## 2016-02-18 MED ORDER — SODIUM CHLORIDE 0.9 % IV SOLN
INTRAVENOUS | Status: DC
  Administered 2016-02-18: 10:00:00 via INTRAVENOUS

## 2016-02-18 MED ORDER — MIDAZOLAM HCL 2 MG/2ML IJ SOLN
INTRAMUSCULAR | Status: AC | PRN
  Administered 2016-02-18 (×3): 1 mg via INTRAVENOUS

## 2016-02-18 NOTE — Procedures (Signed)
CT guided bone marrow biopsy of right ilium.  2 aspirates and 1 core obtained.  No immediate complication.

## 2016-02-18 NOTE — Discharge Instructions (Signed)
Bone Marrow Aspiration and Bone Marrow Biopsy °Bone marrow aspiration and bone marrow biopsy are procedures that are done to diagnose blood disorders. You may also have one of these procedures to help diagnose infections or some types of cancer. °Bone marrow is the soft tissue that is inside your bones. Blood cells are produced in bone marrow. For bone marrow aspiration, a sample of tissue in liquid form is removed from inside your bone. For a bone marrow biopsy, a small core of bone marrow tissue is removed. Then these samples are examined under a microscope or tested in a lab. °You may need these procedures if you have an abnormal complete blood count (CBC). The aspiration or biopsy sample is usually taken from the top of your hip bone. Sometimes, an aspiration sample is taken from your chest bone (sternum). °LET YOUR HEALTH CARE PROVIDER KNOW ABOUT: °· Any allergies you have. °· All medicines you are taking, including vitamins, herbs, eye drops, creams, and over-the-counter medicines. °· Previous problems you or members of your family have had with the use of anesthetics. °· Any blood disorders you have. °· Previous surgeries you have had. °· Any medical conditions you may have. °· Whether you are pregnant or you think that you may be pregnant. °RISKS AND COMPLICATIONS °Generally, this is a safe procedure. However, problems may occur, including: °· Infection. °· Bleeding. °BEFORE THE PROCEDURE °· Ask your health care provider about: °¨ Changing or stopping your regular medicines. This is especially important if you are taking diabetes medicines or blood thinners. °¨ Taking medicines such as aspirin and ibuprofen. These medicines can thin your blood. Do not take these medicines before your procedure if your health care provider instructs you not to. °· Plan to have someone take you home after the procedure. °· If you go home right after the procedure, plan to have someone with you for 24 hours. °PROCEDURE  °· An  IV tube may be inserted into one of your veins. °· The injection site will be cleaned with a germ-killing solution (antiseptic). °· You will be given one or more of the following: °¨ A medicine that helps you relax (sedative). °¨ A medicine that numbs the area (local anesthetic). °· The bone marrow sample will be removed as follows: °¨ For an aspiration, a hollow needle will be inserted through your skin and into your bone. Bone marrow fluid will be drawn up into a syringe. °¨ For a biopsy, your health care provider will use a hollow needle to remove a core of tissue from your bone marrow. °· The needle will be removed. °· A bandage (dressing) will be placed over the insertion site and taped in place. °The procedure may vary among health care providers and hospitals. °AFTER THE PROCEDURE °· Your blood pressure, heart rate, breathing rate, and blood oxygen level will be monitored often until the medicines you were given have worn off. °· Return to your normal activities as directed by your health care provider. °  °This information is not intended to replace advice given to you by your health care provider. Make sure you discuss any questions you have with your health care provider. °  °Document Released: 08/10/2004 Document Revised: 12/22/2014 Document Reviewed: 07/29/2014 °Elsevier Interactive Patient Education ©2016 [employer] ° °Bone Marrow Aspiration and Bone Marrow Biopsy, Care After °Refer to this sheet in the next few weeks. These instructions provide you with information about caring for yourself after your procedure. Your health care provider may also give   you more specific instructions. Your treatment has been planned according to current medical practices, but problems sometimes occur. Call your health care provider if you have any problems or questions after your procedure. °WHAT TO EXPECT AFTER THE PROCEDURE °After your procedure, it is common to have: °· Soreness or tenderness around the puncture  site. °· Bruising. °HOME CARE INSTRUCTIONS °· Take medicines only as directed by your health care provider. °· Follow your health care provider's instructions about: °¨ Puncture site care. °¨ Bandage (dressing) changes and removal. °· Bathe and shower as directed by your health care provider. °· Check your puncture site every day for signs of infection. Watch for: °¨ Redness, swelling, or pain. °¨ Fluid, blood, or pus. °· Return to your normal activities as directed by your health care provider. °· Keep all follow-up visits as directed by your health care provider. This is important. °SEEK MEDICAL CARE IF: °· You have a fever. °· You have uncontrollable bleeding. °· You have redness, swelling, or pain at the site of your puncture. °· You have fluid, blood, or pus coming from your puncture site. °  °This information is not intended to replace advice given to you by your health care provider. Make sure you discuss any questions you have with your health care provider. °  °Document Released: 02/24/2005 Document Revised: 12/22/2014 Document Reviewed: 07/29/2014 °Elsevier Interactive Patient Education ©2016 [employer] ° °Moderate Conscious Sedation, Adult, Care After °Refer to this sheet in the next few weeks. These instructions provide you with information on caring for yourself after your procedure. Your health care provider may also give you more specific instructions. Your treatment has been planned according to current medical practices, but problems sometimes occur. Call your health care provider if you have any problems or questions after your procedure. °WHAT TO EXPECT AFTER THE PROCEDURE  °After your procedure: °· You may feel sleepy, clumsy, and have poor balance for several hours. °· Vomiting may occur if you eat too soon after the procedure. °HOME CARE INSTRUCTIONS °· Do not participate in any activities where you could become injured for at least 24 hours. Do not: °¨ Drive. °¨ Swim. °¨ Ride a  bicycle. °¨ Operate heavy machinery. °¨ Cook. °¨ Use power tools. °¨ Climb ladders. °¨ Work from a high place. °· Do not make important decisions or sign legal documents until you are improved. °· If you vomit, drink water, juice, or soup when you can drink without vomiting. Make sure you have little or no nausea before eating solid foods. °· Only take over-the-counter or prescription medicines for pain, discomfort, or fever as directed by your health care provider. °· Make sure you and your family fully understand everything about the medicines given to you, including what side effects may occur. °· You should not drink alcohol, take sleeping pills, or take medicines that cause drowsiness for at least 24 hours. °· If you smoke, do not smoke without supervision. °· If you are feeling better, you may resume normal activities 24 hours after you were sedated. °· Keep all appointments with your health care provider. °SEEK MEDICAL CARE IF: °· Your skin is pale or bluish in color. °· You continue to feel nauseous or vomit. °· Your pain is getting worse and is not helped by medicine. °· You have bleeding or swelling. °· You are still sleepy or feeling clumsy after 24 hours. °SEEK IMMEDIATE MEDICAL CARE IF: °· You develop a rash. °· You have difficulty breathing. °· You develop any   type of allergic problem. °· You have a fever. °MAKE SURE YOU: °· Understand these instructions. °· Will watch your condition. °· Will get help right away if you are not doing well or get worse. °  °This information is not intended to replace advice given to you by your health care provider. Make sure you discuss any questions you have with your health care provider. °  °Document Released: 05/28/2013 Document Revised: 08/28/2014 Document Reviewed: 05/28/2013 °Elsevier Interactive Patient Education ©2016 Elsevier Inc. ° °
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
## 2016-02-25 ENCOUNTER — Ambulatory Visit: Payer: Medicare Other | Admitting: Nurse Practitioner

## 2016-02-25 ENCOUNTER — Other Ambulatory Visit: Payer: Medicare Other

## 2016-02-28 ENCOUNTER — Telehealth: Payer: Self-pay | Admitting: Cardiology

## 2016-02-28 NOTE — Telephone Encounter (Signed)
The pt states that the chair he was sitting in turned over and he fell to the floor this morning. He denies dizziness and lightheadedness. He is not currently taking any blood thinners and states that he is not hurt and denies any bleeding, scraps, cuts and he states that he did not hit his head. I offered to contact his brother but the pts states that he is ok and asked that I not call him. He states that if he has anymore falls he will contact us or his PCP.
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
## 2016-02-29 LAB — CHROMOSOME ANALYSIS, BONE MARROW

## 2016-03-02 ENCOUNTER — Other Ambulatory Visit: Payer: Medicare Other

## 2016-03-02 ENCOUNTER — Ambulatory Visit: Payer: Medicare Other | Admitting: Nurse Practitioner

## 2016-03-02 ENCOUNTER — Ambulatory Visit (HOSPITAL_BASED_OUTPATIENT_CLINIC_OR_DEPARTMENT_OTHER): Payer: Medicare Other | Admitting: Nurse Practitioner

## 2016-03-02 ENCOUNTER — Telehealth: Payer: Self-pay | Admitting: Nurse Practitioner

## 2016-03-02 ENCOUNTER — Other Ambulatory Visit (HOSPITAL_BASED_OUTPATIENT_CLINIC_OR_DEPARTMENT_OTHER): Payer: Medicare Other

## 2016-03-02 VITALS — BP 142/68 | HR 53 | Temp 98.1°F | Resp 17 | Ht 71.0 in | Wt 185.0 lb

## 2016-03-02 DIAGNOSIS — Z85038 Personal history of other malignant neoplasm of large intestine: Secondary | ICD-10-CM

## 2016-03-02 DIAGNOSIS — D4620 Refractory anemia with excess of blasts, unspecified: Secondary | ICD-10-CM

## 2016-03-02 DIAGNOSIS — D469 Myelodysplastic syndrome, unspecified: Principal | ICD-10-CM

## 2016-03-02 DIAGNOSIS — IMO0001 Reserved for inherently not codable concepts without codable children: Secondary | ICD-10-CM

## 2016-03-02 LAB — CBC WITH DIFFERENTIAL/PLATELET
BASO%: 0.4 % (ref 0.0–2.0)
BASOS ABS: 0 10*3/uL (ref 0.0–0.1)
EOS%: 0.2 % (ref 0.0–7.0)
Eosinophils Absolute: 0 10*3/uL (ref 0.0–0.5)
HCT: 31.9 % — ABNORMAL LOW (ref 38.4–49.9)
HEMOGLOBIN: 10.3 g/dL — AB (ref 13.0–17.1)
LYMPH#: 0.4 10*3/uL — AB (ref 0.9–3.3)
LYMPH%: 17 % (ref 14.0–49.0)
MCH: 28.7 pg (ref 27.2–33.4)
MCHC: 32.4 g/dL (ref 32.0–36.0)
MCV: 88.5 fL (ref 79.3–98.0)
MONO#: 1.1 10*3/uL — AB (ref 0.1–0.9)
MONO%: 45.3 % — ABNORMAL HIGH (ref 0.0–14.0)
NEUT%: 37.1 % — AB (ref 39.0–75.0)
NEUTROS ABS: 0.9 10*3/uL — AB (ref 1.5–6.5)
PLATELETS: 37 10*3/uL — AB (ref 140–400)
RBC: 3.6 10*6/uL — AB (ref 4.20–5.82)
RDW: 16.7 % — AB (ref 11.0–14.6)
WBC: 2.5 10*3/uL — ABNORMAL LOW (ref 4.0–10.3)

## 2016-03-02 MED ORDER — FLUCONAZOLE 100 MG PO TABS: 100.0000 mg | ORAL_TABLET | Freq: Every day | ORAL | Status: DC

## 2016-03-02 NOTE — Progress Notes (Signed)
1130  Dressing changed on right arm abrasion,2 quarter sized abrasions, bruising noted around site, no bleeding noted at this time.

## 2016-03-02 NOTE — Telephone Encounter (Signed)
per pof to sch pt appt-pt didnt want to sch trmt at this time until discuss w/family-gave avs for appt on 7/24-no care plan on file

## 2016-03-02 NOTE — Progress Notes (Addendum)
[facility] OFFICE PROGRESS NOTE   Diagnosis:  Colon cancer, myelodysplasia  INTERVAL HISTORY:   [name] returns as scheduled. He recently "fell out of bed". He has an abrasion on the right forearm. He notes easy bruising. No other bleeding. No fever. No shortness of breath.  Objective:  Vital signs in last 24 hours:  Blood pressure 142/68, pulse 53, temperature 98.1 F (36.7 C), temperature source Oral, resp. rate 17, height '5\' 11"'$  (1.803 m), weight 185 lb (83.915 kg), SpO2 100 %.    HEENT: Brown coating over tongue. No buccal thrush. No bleeding. Resp: Coarse breath sounds at the lower lung fields bilaterally. No respiratory distress. Cardio: Regular rate and rhythm. GI: Abdomen soft and nontender. No organomegaly. Vascular: Pitting edema at the lower legs bilaterally. Skin: Ecchymoses scattered over forearms. Healing abrasion right forearm.    Lab Results:  Lab Results  Component Value Date   WBC 2.5* 03/02/2016   HGB 10.3* 03/02/2016   HCT 31.9* 03/02/2016   MCV 88.5 03/02/2016   PLT 37* 03/02/2016   NEUTROABS 0.9* 03/02/2016    Imaging:  No results found.  Medications: I have reviewed the patient's current medications.  Assessment/Plan: 1. Stage III colon cancer diagnosed in August 2008, status post adjuvant Xeloda chemotherapy, completed in April 2009. He underwent a colonoscopy 11/16/2011 with multiple polyps. 2. History of increased tearing, status post right tear duct stent placement.  3. History of multiple colonic polyps, status post a negative colonoscopy by Dr. Benson Norway in May 2011.  4. Anxiety disorder.  5. Multiple back surgeries.  6. Hypertension.  7. Gastroesophageal reflux disease, status post a Nissen fundoplication.  8. Macular degeneration, followed by Dr. Zadie Rhine.  9. Right ear "tinnitus," and hearing loss followed by Dr. Ernesto Rutherford.  10. Severe microcytic anemia. Ferritin returned low at 7 on 11/13/2011. He was  transfused 2 units of blood. Bone marrow biopsy on 11/28/2011 confirmed decreased iron stores. The hemoglobin normalized. He continues oral iron. 11. Hemoccult positive stool. He underwent an upper endoscopy on 11/16/2011 with findings of moderate gastritis and question atypical duodenal AVMs. There was no evidence of active bleeding. Colonoscopy also on 11/16/2011 showed multiple polyps, hemorrhoids and diverticula. 12. Thrombocytopenia. Stable. 13. Mild leukopenia. Stable. 14. Mildly elevated LDH 11/13/2011. 15. Mildly elevated PT 11/13/2011. 16. 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 There was no evidence of metastatic carcinoma. Storage iron was decreased. Cytogenetic returned with a normal 46 XY karyotype. A molecular FISH panel was negative. Restaging bone marrow 02/18/2016 showed hypercellular bone marrow for age with persistent myelodysplastic state. Blast cell count slightly higher at 16%. Storage iron present. Cytogenetic analysis was normal.   Disposition: [name] has persistent/progressive pancytopenia. The recent repeat bone marrow biopsy again showed myelodysplasia. The blast count was higher. Dr. Benay Spice reviewed these findings with [name] and his son. They understand [name] is at risk for conversion to acute leukemia. Dr. Benay Spice discussed options to include a supportive care approach with transfusion support versus a trial of 5-azacytidine daily for 5 days every 4 weeks. We reviewed potential toxicities associated with 5-azacytidine including nausea, diarrhea, rash and lowering of the blood counts.  [name] and his son would like to discuss this information with the rest of the family prior to making a decision. He will return for a follow-up visit on 03/13/2016. His son will contact the office prior to  that visit with their  decision.  Patient seen with Dr. Benay Spice. 25 minutes were spent face-to-face at today's visit with the majority of that time involved in counseling/coordination of care.    Ned Card ANP/GNP-BC   03/02/2016  11:22 AM  This was a shared visit with Ned Card. 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  We discussed treatment options including supportive care and 5-azacytidine. [name] and his family will discuss options further and contact us with his decision on treatment.  Julieanne Manson, MD

## 2016-03-03 ENCOUNTER — Telehealth: Payer: Self-pay

## 2016-03-03 NOTE — Telephone Encounter (Signed)
[name] called stating his father has decided to go ahead with the vidaza shots. He was wanting to start on July 24th with his current lab/[facility] appt.  [name] asked that he be called because he will be arranging who will be driving his father to appts. [name]'s phone numbers updated in chart.

## 2016-03-03 NOTE — Telephone Encounter (Signed)
Arvid Right NP and M. Wheat notified of pt.'s son request.  Message left on [name]'s voicemail to inform him that he would be contacted regarding appointment times within the next few business days by a scheduler.

## 2016-03-06 ENCOUNTER — Telehealth: Payer: Self-pay | Admitting: Cardiology

## 2016-03-06 MED ORDER — METOPROLOL TARTRATE 25 MG PO TABS: 25.0000 mg | ORAL_TABLET | Freq: Two times a day (BID) | ORAL | Status: DC

## 2016-03-06 NOTE — Telephone Encounter (Signed)
New message    *STAT* If patient is at the pharmacy, call can be transferred to refill team.   1. Which medications need to be refilled? (please list name of each medication and dose if known) metoprolol 25mg   2. Which pharmacy/location (including street and city if local pharmacy) is medication to be sent to?  [facility] on [address]  3. Do they need a 30 day or 90 day supply? 30, needs clarification of direction of dosage

## 2016-03-06 NOTE — Telephone Encounter (Signed)
Confirmed with patient's son patient should be taking Metoprolol 25 mg BID. 90 day supply called in to [facility] per his request. He was grateful for call.

## 2016-03-07 ENCOUNTER — Telehealth: Payer: Self-pay | Admitting: *Deleted

## 2016-03-07 NOTE — Telephone Encounter (Signed)
Message from pt's son stating he was expecting a call from office with appts. Returned call informed him of appts for next week. Noted 7/27 is not scheduled. Chemo room volume is high that day. Discussed with supervisor, OK to add pt for that afternoon. Requested chemo scheduler contact son today so he may arrange transportation.

## 2016-03-08 ENCOUNTER — Encounter (HOSPITAL_COMMUNITY): Payer: Self-pay

## 2016-03-09 ENCOUNTER — Ambulatory Visit (HOSPITAL_COMMUNITY)
Admission: EM | Admit: 2016-03-09 | Discharge: 2016-03-09 | Disposition: A | Discharge status: Home / Self Care | Payer: Medicare Other | Attending: Emergency Medicine | Admitting: Emergency Medicine

## 2016-03-09 ENCOUNTER — Ambulatory Visit (INDEPENDENT_AMBULATORY_CARE_PROVIDER_SITE_OTHER): Payer: Medicare Other

## 2016-03-09 ENCOUNTER — Encounter (HOSPITAL_COMMUNITY): Payer: Self-pay | Admitting: Emergency Medicine

## 2016-03-09 DIAGNOSIS — R0781 Pleurodynia: Secondary | ICD-10-CM | POA: Diagnosis not present

## 2016-03-09 DIAGNOSIS — S299XXA Unspecified injury of thorax, initial encounter: Secondary | ICD-10-CM | POA: Diagnosis not present

## 2016-03-09 DIAGNOSIS — R079 Chest pain, unspecified: Secondary | ICD-10-CM | POA: Diagnosis not present

## 2016-03-09 NOTE — ED Notes (Signed)
Pt slipped off is bed last night onto a hardwood floor.  Pt was unable to get up and had to crawl on the floor to get to the phone.  Pt suffered from skin tears on his left arm and he is having a lot of pain in the arm, his left ribs and into his left axillary.

## 2016-03-09 NOTE — Discharge Instructions (Signed)
Chest Wall Pain °Chest wall pain is pain in or around the bones and muscles of your chest. Sometimes, an injury causes this pain. Sometimes, the cause may not be known. This pain may take several weeks or longer to get better. °HOME CARE °Pay attention to any changes in your symptoms. Take these actions to help with your pain: °· Rest as told by your doctor. °· Avoid activities that cause pain. Try not to use your chest, belly (abdominal), or side muscles to lift heavy things. °· If directed, apply ice to the painful area: °¨ Put ice in a plastic bag. °¨ Place a towel between your skin and the bag. °¨ Leave the ice on for 20 minutes, 2-3 times per day. °· Take over-the-counter and prescription medicines only as told by your doctor. °· Do not use tobacco products, including cigarettes, chewing tobacco, and e-cigarettes. If you need help quitting, ask your doctor. °· Keep all follow-up visits as told by your doctor. This is important. °GET HELP IF: °· You have a fever. °· Your chest pain gets worse. °· You have new symptoms. °GET HELP RIGHT AWAY IF: °· You feel sick to your stomach (nauseous) or you throw up (vomit). °· You feel sweaty or light-headed. °· You have a cough with phlegm (sputum) or you cough up blood. °· You are short of breath. °  °This information is not intended to replace advice given to you by your health care provider. Make sure you discuss any questions you have with your health care provider. °  °Document Released: 01/24/2008 Document Revised: 04/28/2015 Document Reviewed: 11/02/2014 °Elsevier Interactive Patient Education ©2016 Elsevier Inc. ° °
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
## 2016-03-12 ENCOUNTER — Other Ambulatory Visit: Payer: Self-pay | Admitting: Oncology

## 2016-03-13 ENCOUNTER — Telehealth: Payer: Self-pay | Admitting: Oncology

## 2016-03-13 ENCOUNTER — Ambulatory Visit (HOSPITAL_BASED_OUTPATIENT_CLINIC_OR_DEPARTMENT_OTHER): Payer: Medicare Other | Admitting: Nurse Practitioner

## 2016-03-13 ENCOUNTER — Other Ambulatory Visit (HOSPITAL_BASED_OUTPATIENT_CLINIC_OR_DEPARTMENT_OTHER): Payer: Medicare Other

## 2016-03-13 ENCOUNTER — Ambulatory Visit: Payer: Medicare Other

## 2016-03-13 ENCOUNTER — Telehealth: Payer: Self-pay | Admitting: *Deleted

## 2016-03-13 VITALS — BP 130/55 | HR 77 | Temp 99.3°F | Resp 18 | Ht 71.0 in | Wt 186.3 lb

## 2016-03-13 DIAGNOSIS — D4620 Refractory anemia with excess of blasts, unspecified: Secondary | ICD-10-CM

## 2016-03-13 DIAGNOSIS — D469 Myelodysplastic syndrome, unspecified: Principal | ICD-10-CM

## 2016-03-13 DIAGNOSIS — Z85038 Personal history of other malignant neoplasm of large intestine: Secondary | ICD-10-CM

## 2016-03-13 DIAGNOSIS — IMO0001 Reserved for inherently not codable concepts without codable children: Secondary | ICD-10-CM

## 2016-03-13 LAB — COMPREHENSIVE METABOLIC PANEL
ALT: 13 U/L (ref 0–55)
ANION GAP: 10 meq/L (ref 3–11)
AST: 17 U/L (ref 5–34)
Albumin: 3.8 g/dL (ref 3.5–5.0)
Alkaline Phosphatase: 112 U/L (ref 40–150)
BILIRUBIN TOTAL: 1.03 mg/dL (ref 0.20–1.20)
BUN: 18 mg/dL (ref 7.0–26.0)
CALCIUM: 8.9 mg/dL (ref 8.4–10.4)
CHLORIDE: 112 meq/L — AB (ref 98–109)
CO2: 20 meq/L — AB (ref 22–29)
Creatinine: 2.3 mg/dL — ABNORMAL HIGH (ref 0.7–1.3)
EGFR: 26 mL/min/{1.73_m2} — AB (ref >90)
Glucose: 105 mg/dl (ref 70–140)
Potassium: 3.8 mEq/L (ref 3.5–5.1)
Sodium: 143 mEq/L (ref 136–145)
Total Protein: 7.1 g/dL (ref 6.4–8.3)

## 2016-03-13 LAB — CBC WITH DIFFERENTIAL/PLATELET
BASO%: 0.2 % (ref 0.0–2.0)
BASOS ABS: 0 10*3/uL (ref 0.0–0.1)
EOS%: 0 % (ref 0.0–7.0)
Eosinophils Absolute: 0 10*3/uL (ref 0.0–0.5)
HCT: 27.8 % — ABNORMAL LOW (ref 38.4–49.9)
HEMOGLOBIN: 9.3 g/dL — AB (ref 13.0–17.1)
LYMPH#: 0.7 10*3/uL — AB (ref 0.9–3.3)
LYMPH%: 15.9 % (ref 14.0–49.0)
MCH: 29.1 pg (ref 27.2–33.4)
MCHC: 33.5 g/dL (ref 32.0–36.0)
MCV: 86.9 fL (ref 79.3–98.0)
MONO#: 2 10*3/uL — ABNORMAL HIGH (ref 0.1–0.9)
MONO%: 44.2 % — ABNORMAL HIGH (ref 0.0–14.0)
NEUT%: 39.7 % (ref 39.0–75.0)
NEUTROS ABS: 1.8 10*3/uL (ref 1.5–6.5)
NRBC: 0 % (ref 0–0)
Platelets: 41 10*3/uL — ABNORMAL LOW (ref 140–400)
RBC: 3.2 10*6/uL — AB (ref 4.20–5.82)
RDW: 16.5 % — AB (ref 11.0–14.6)
WBC: 4.5 10*3/uL (ref 4.0–10.3)

## 2016-03-13 LAB — TECHNOLOGIST REVIEW

## 2016-03-13 NOTE — Telephone Encounter (Signed)
spoke pt confirmed apt for next week (7/31-8/4)

## 2016-03-13 NOTE — Progress Notes (Addendum)
[place] OFFICE PROGRESS NOTE   Diagnosis:  Colon cancer, myelodysplasia  INTERVAL HISTORY:   Mr. Going returns as scheduled. He fell last week. When his sister-in-law arrived to his home this morning to pick him up for today's appointment she found him on the floor. She called her husband to assist with getting him up and into the car. Mr. Mcfarlane reports that his shoes were "slick" on the wood floor. He notes multiple bruises on his forearms. He also has a bruise and swelling at the left forehead. He denies headaches. No diplopia. No fever. He lives alone.  Objective:  Vital signs in last 24 hours:  Blood pressure (!) 130/55, pulse 77, temperature 99.3 F (37.4 C), temperature source Oral, resp. rate 18, height '5\' 11"'$  (1.803 m), weight 186 lb 4.8 oz (84.5 kg), SpO2 97 %.    HEENT: Pupils equal round and reactive to light. Brown coating over tongue. Resp: Faint rales at both lung bases. No respiratory distress. Cardio:  Regular rate and rhythm. GI:  Abdomen soft and nontender. No organomegaly. Vascular:  Pitting edema at the lower legs bilaterally. Neuro:  Alert and oriented. Follows commands.  Skin:  Large ecchymoses over both forearms. Ecchymosis left forehead.    Lab Results:  Lab Results  Component Value Date   WBC 4.5 03/13/2016   HGB 9.3 (L) 03/13/2016   HCT 27.8 (L) 03/13/2016   MCV 86.9 03/13/2016   PLT 41 (L) 03/13/2016   NEUTROABS 1.8 03/13/2016    Imaging:  No results found.  Medications: I have reviewed the patient's current medications.  Assessment/Plan: 1. Stage III colon cancer diagnosed in August 2008, status post adjuvant Xeloda chemotherapy, completed in April 2009. He underwent a colonoscopy 11/16/2011 with multiple polyps. 2. History of increased tearing, status post right tear duct stent placement.  3. History of multiple colonic polyps, status post a negative colonoscopy by Dr. Benson Norway in May 2011.  4. Anxiety disorder.   5. Multiple back surgeries.  6. Hypertension.  7. Gastroesophageal reflux disease, status post a Nissen fundoplication.  8. Macular degeneration, followed by Dr. Zadie Rhine.  9. Right ear "tinnitus," and hearing loss followed by Dr. Ernesto Rutherford.  10. Severe microcytic anemia. Ferritin returned low at 7 on 11/13/2011. He was transfused 2 units of blood. Bone marrow biopsy on 11/28/2011 confirmed decreased iron stores. The hemoglobin normalized. He continues oral iron. 11. Hemoccult positive stool. He underwent an upper endoscopy on 11/16/2011 with findings of moderate gastritis and question atypical duodenal AVMs. There was no evidence of active bleeding. Colonoscopy also on 11/16/2011 showed multiple polyps, hemorrhoids and diverticula. 12. Thrombocytopenia. Stable. 13. Mild leukopenia. Stable. 14. Mildly elevated LDH 11/13/2011. 15. Mildly elevated PT 11/13/2011. 16. 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 There was no evidence of metastatic carcinoma. Storage iron was decreased. Cytogenetic returned with a normal 71 XY karyotype. A molecular FISH panel was negative. Restaging bone marrow 02/18/2016 showed hypercellular bone marrow for age with persistent myelodysplastic state. Blast cell count slightly higher at 16%. Storage iron present. Cytogenetic analysis was normal.   Disposition: Mr. Bottomley is scheduled to begin cycle one 5-azacytidine today. He appears weaker at today's visit. He has had multiple falls. He lives alone. We discussed our concern regarding proceeding with treatment in this setting. We recommended to Mr. Mondor and his sister-in-law that someone be with him 24 hours a day. He may  require placement.   We decided to hold treatment this week and reschedule to begin on 03/20/2016. We asked Mr. Wiederholt to please have his son contact the office so we can  discuss further.   Patient seen with Dr. Benay Spice. 25 minutes were spent face-to-face at today's visit with the majority of that time involved in counseling/coordination of care.    Ned Card ANP/GNP-BC   03/13/2016  10:25 AM  This was a shared visit with Ned Card. Mr. Greenblatt has progressive anemia and appears to have a declining performance status. We are not comfortable administering 5 azacytidine unless he has 24-hour care at home or is placed in a nursing facility. We discussed this with his sister-in-law. She is in agreement. She will have further discussion with other family members and he will return for an office visit in one week.  Julieanne Manson, M.D.

## 2016-03-13 NOTE — Telephone Encounter (Signed)
Per staff message and POF I have scheduled appts. Advised scheduler of appts. JMW  

## 2016-03-13 NOTE — Telephone Encounter (Signed)
Gave pt cal & avs °

## 2016-03-14 ENCOUNTER — Telehealth: Payer: Self-pay

## 2016-03-14 ENCOUNTER — Ambulatory Visit: Payer: Medicare Other

## 2016-03-14 NOTE — Telephone Encounter (Signed)
-----   Message from Owens Shark, NP sent at 03/14/2016  8:51 AM EDT ----- Please have him discontinue Benazepril.

## 2016-03-14 NOTE — Telephone Encounter (Signed)
Called and spoke with patients son, Clintin Vandyken. Informed him Dr. Benay Spice would like pt to discontinue Benazepril, Mr. Lawry verbalized understanding. Also stated he tried calling this morning and left a message fr Dr. Benay Spice. States he tried calling him yesterday but was unable to get in touch around 430pm and was told to call back this morning. States he was told Dr. Benay Spice wanted to speak with him regarding pt. Informed pt this nurse would inform Dr. Benay Spice to call him.  Spoke with Lorriane Shire, RN with Dr. Benay Spice, she received the VM from patients son this morning. Dr. Benay Spice and Lorriane Shire informed of this information. Dr.Sherrill will call Truman Hayward.

## 2016-03-15 ENCOUNTER — Ambulatory Visit: Payer: Medicare Other

## 2016-03-16 ENCOUNTER — Ambulatory Visit: Payer: Medicare Other

## 2016-03-17 ENCOUNTER — Telehealth: Payer: Self-pay | Admitting: *Deleted

## 2016-03-17 ENCOUNTER — Ambulatory Visit: Payer: Medicare Other

## 2016-03-17 NOTE — Telephone Encounter (Signed)
Dr. Benay Spice discussed plan with pt's son, Truman Hayward. Pt has decided not to proceed with chemo.  Canceled all injection appts except 7/31 in case it is decided to add on any infusion after visit.

## 2016-03-20 ENCOUNTER — Other Ambulatory Visit (HOSPITAL_BASED_OUTPATIENT_CLINIC_OR_DEPARTMENT_OTHER): Payer: Medicare Other

## 2016-03-20 ENCOUNTER — Ambulatory Visit: Payer: Medicare Other | Admitting: Cardiology

## 2016-03-20 ENCOUNTER — Telehealth: Payer: Self-pay | Admitting: Oncology

## 2016-03-20 ENCOUNTER — Ambulatory Visit: Payer: Medicare Other

## 2016-03-20 ENCOUNTER — Ambulatory Visit (HOSPITAL_BASED_OUTPATIENT_CLINIC_OR_DEPARTMENT_OTHER): Payer: Medicare Other | Admitting: Oncology

## 2016-03-20 VITALS — BP 134/59 | HR 67 | Temp 98.2°F | Resp 18 | Ht 71.0 in | Wt 185.7 lb

## 2016-03-20 DIAGNOSIS — D61818 Other pancytopenia: Secondary | ICD-10-CM | POA: Diagnosis not present

## 2016-03-20 DIAGNOSIS — Z85038 Personal history of other malignant neoplasm of large intestine: Secondary | ICD-10-CM

## 2016-03-20 DIAGNOSIS — IMO0001 Reserved for inherently not codable concepts without codable children: Secondary | ICD-10-CM

## 2016-03-20 DIAGNOSIS — D4620 Refractory anemia with excess of blasts, unspecified: Secondary | ICD-10-CM

## 2016-03-20 DIAGNOSIS — D696 Thrombocytopenia, unspecified: Secondary | ICD-10-CM | POA: Diagnosis not present

## 2016-03-20 DIAGNOSIS — D469 Myelodysplastic syndrome, unspecified: Principal | ICD-10-CM

## 2016-03-20 LAB — CBC WITH DIFFERENTIAL/PLATELET
BASO%: 0.3 % (ref 0.0–2.0)
BASOS ABS: 0 10*3/uL (ref 0.0–0.1)
EOS%: 0.1 % (ref 0.0–7.0)
Eosinophils Absolute: 0 10*3/uL (ref 0.0–0.5)
HEMATOCRIT: 29.6 % — AB (ref 38.4–49.9)
HGB: 9.8 g/dL — ABNORMAL LOW (ref 13.0–17.1)
LYMPH%: 13.6 % — ABNORMAL LOW (ref 14.0–49.0)
MCH: 28.8 pg (ref 27.2–33.4)
MCHC: 33.2 g/dL (ref 32.0–36.0)
MCV: 86.9 fL (ref 79.3–98.0)
MONO#: 1.5 10*3/uL — ABNORMAL HIGH (ref 0.1–0.9)
MONO%: 40.6 % — AB (ref 0.0–14.0)
NEUT#: 1.6 10*3/uL (ref 1.5–6.5)
NEUT%: 45.4 % (ref 39.0–75.0)
Platelets: 80 10*3/uL — ABNORMAL LOW (ref 140–400)
RBC: 3.4 10*6/uL — ABNORMAL LOW (ref 4.20–5.82)
RDW: 17 % — ABNORMAL HIGH (ref 11.0–14.6)
WBC: 3.6 10*3/uL — ABNORMAL LOW (ref 4.0–10.3)
lymph#: 0.5 10*3/uL — ABNORMAL LOW (ref 0.9–3.3)

## 2016-03-20 LAB — COMPREHENSIVE METABOLIC PANEL
ALT: 18 U/L (ref 0–55)
AST: 18 U/L (ref 5–34)
Albumin: 3.8 g/dL (ref 3.5–5.0)
Alkaline Phosphatase: 125 U/L (ref 40–150)
Anion Gap: 9 mEq/L (ref 3–11)
BUN: 15.2 mg/dL (ref 7.0–26.0)
CALCIUM: 8.8 mg/dL (ref 8.4–10.4)
CHLORIDE: 111 meq/L — AB (ref 98–109)
CO2: 21 mEq/L — ABNORMAL LOW (ref 22–29)
Creatinine: 2 mg/dL — ABNORMAL HIGH (ref 0.7–1.3)
EGFR: 32 mL/min/{1.73_m2} — ABNORMAL LOW (ref >90)
Glucose: 112 mg/dl (ref 70–140)
POTASSIUM: 4 meq/L (ref 3.5–5.1)
SODIUM: 141 meq/L (ref 136–145)
Total Bilirubin: 0.55 mg/dL (ref 0.20–1.20)
Total Protein: 7.2 g/dL (ref 6.4–8.3)

## 2016-03-20 LAB — TECHNOLOGIST REVIEW

## 2016-03-20 NOTE — Progress Notes (Signed)
Per Lavella Lemons, RN, referred patient to [facility].

## 2016-03-20 NOTE — Telephone Encounter (Signed)
Gave pt cal & avs °

## 2016-03-20 NOTE — Progress Notes (Signed)
  [facility] OFFICE PROGRESS NOTE   Diagnosis: Myelodysplasia  INTERVAL HISTORY:   [name] returns as scheduled. He is accompanied by his son. I discussed treatment options with his son by telephone last week. No further falls. Good appetite. No fever or bleeding. He continues to have pain on the left side of his chest after the fall a few weeks ago.  Objective:  Vital signs in last 24 hours:  Blood pressure (!) 134/59, pulse 67, temperature 98.2 F (36.8 C), temperature source Oral, resp. rate 18, height _0  (1.803 m), weight 185 lb 11.2 oz (84.2 kg), SpO2 98 %.    HEENT: No thrush or bleeding. Resp: Lungs clear bilaterally Cardio: Regular rate and rhythm GI: No hepatosplenomegaly Vascular: Trace low leg edema bilaterally Neuro: He could ambulate to the examination table with assistance  Skin: Scattered ecchymoses over the trunk and extremities in various stages of healing   Lab Results:  Lab Results  Component Value Date   WBC 3.6 (L) 03/20/2016   HGB 9.8 (L) 03/20/2016   HCT 29.6 (L) 03/20/2016   MCV 86.9 03/20/2016   PLT 80 (L) 03/20/2016   NEUTROABS 1.6 03/20/2016     Medications: I have reviewed the patient's current medications.  Assessment/Plan: 1. Stage III colon cancer diagnosed in August 2008, status post adjuvant Xeloda chemotherapy, completed in April 2009. He underwent a colonoscopy 11/16/2011 with multiple polyps. 2. History of increased tearing, status post right tear duct stent placement.  3. History of multiple colonic polyps, status post a negative colonoscopy by Dr. Benson Norway in May 2011.  4. Anxiety disorder.  5. Multiple back surgeries.  6. Hypertension.  7. Gastroesophageal reflux disease, status post a Nissen fundoplication.  8. Macular degeneration, followed by Dr. Zadie Rhine.  9. Right ear "tinnitus," and hearing loss followed by Dr. Ernesto Rutherford.  10. Severe microcytic anemia. Ferritin returned low at 7 on 11/13/2011. He  was transfused 2 units of blood. Bone marrow biopsy on 11/28/2011 confirmed decreased iron stores. The hemoglobin normalized. He continues oral iron. 11. Hemoccult positive stool. He underwent an upper endoscopy on 11/16/2011 with findings of moderate gastritis and question atypical duodenal AVMs. There was no evidence of active bleeding. Colonoscopy also on 11/16/2011 showed multiple polyps, hemorrhoids and diverticula. 12. Thrombocytopenia. Stable. 13. Mild leukopenia. Stable. 14. Mildly elevated LDH 11/13/2011. 15. Mildly elevated PT 11/13/2011. 16. 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 There was no evidence of metastatic carcinoma. Storage iron was decreased. Cytogenetic returned with a normal 55 XY karyotype. A molecular FISH panel was negative. Restaging bone marrow 02/18/2016 showed hypercellular bone marrow for age with persistent myelodysplastic state. Blast cell count slightly higher at 16%. Storage iron present. Cytogenetic analysis was normal.    Disposition:  [name] appears stable. He has persistent pancytopenia. The hemoglobin and platelets are slightly higher today.  [name] has decided against a trial of 5 azacytidine. The plan is to follow him with comfort care. His prognosis for survival beyond the next 6 months is poor. His family will contact us for new symptoms including bleeding. He agrees to a [facility] referral.  We discussed CPR and ACLS issues. He will be placed on a no CODE BLUE status.  [name] will return for an office visit and CBC on 04/14/2016.  Betsy Coder, MD  03/20/2016  11:55 AM

## 2016-03-21 ENCOUNTER — Ambulatory Visit: Payer: Medicare Other

## 2016-03-21 DIAGNOSIS — L209 Atopic dermatitis, unspecified: Secondary | ICD-10-CM | POA: Diagnosis not present

## 2016-03-21 DIAGNOSIS — D469 Myelodysplastic syndrome, unspecified: Secondary | ICD-10-CM | POA: Diagnosis not present

## 2016-03-21 DIAGNOSIS — I10 Essential (primary) hypertension: Secondary | ICD-10-CM | POA: Diagnosis not present

## 2016-03-21 DIAGNOSIS — J309 Allergic rhinitis, unspecified: Secondary | ICD-10-CM | POA: Diagnosis not present

## 2016-03-21 DIAGNOSIS — C189 Malignant neoplasm of colon, unspecified: Secondary | ICD-10-CM | POA: Diagnosis not present

## 2016-03-21 DIAGNOSIS — F411 Generalized anxiety disorder: Secondary | ICD-10-CM | POA: Diagnosis not present

## 2016-03-21 DIAGNOSIS — D61818 Other pancytopenia: Secondary | ICD-10-CM | POA: Diagnosis not present

## 2016-03-21 DIAGNOSIS — N183 Chronic kidney disease, stage 3 (moderate): Secondary | ICD-10-CM | POA: Diagnosis not present

## 2016-03-21 DIAGNOSIS — K219 Gastro-esophageal reflux disease without esophagitis: Secondary | ICD-10-CM | POA: Diagnosis not present

## 2016-03-21 DIAGNOSIS — H9319 Tinnitus, unspecified ear: Secondary | ICD-10-CM | POA: Diagnosis not present

## 2016-03-21 DIAGNOSIS — H3530 Unspecified macular degeneration: Secondary | ICD-10-CM | POA: Diagnosis not present

## 2016-03-22 ENCOUNTER — Ambulatory Visit: Payer: Medicare Other

## 2016-03-22 DIAGNOSIS — C189 Malignant neoplasm of colon, unspecified: Secondary | ICD-10-CM | POA: Diagnosis not present

## 2016-03-22 DIAGNOSIS — F411 Generalized anxiety disorder: Secondary | ICD-10-CM | POA: Diagnosis not present

## 2016-03-22 DIAGNOSIS — I10 Essential (primary) hypertension: Secondary | ICD-10-CM | POA: Diagnosis not present

## 2016-03-22 DIAGNOSIS — D469 Myelodysplastic syndrome, unspecified: Secondary | ICD-10-CM | POA: Diagnosis not present

## 2016-03-22 DIAGNOSIS — D61818 Other pancytopenia: Secondary | ICD-10-CM | POA: Diagnosis not present

## 2016-03-22 DIAGNOSIS — N183 Chronic kidney disease, stage 3 (moderate): Secondary | ICD-10-CM | POA: Diagnosis not present

## 2016-03-23 ENCOUNTER — Ambulatory Visit: Payer: Medicare Other

## 2016-03-23 ENCOUNTER — Other Ambulatory Visit: Payer: Medicare Other

## 2016-03-23 DIAGNOSIS — F411 Generalized anxiety disorder: Secondary | ICD-10-CM | POA: Diagnosis not present

## 2016-03-23 DIAGNOSIS — N183 Chronic kidney disease, stage 3 (moderate): Secondary | ICD-10-CM | POA: Diagnosis not present

## 2016-03-23 DIAGNOSIS — C189 Malignant neoplasm of colon, unspecified: Secondary | ICD-10-CM | POA: Diagnosis not present

## 2016-03-23 DIAGNOSIS — D61818 Other pancytopenia: Secondary | ICD-10-CM | POA: Diagnosis not present

## 2016-03-23 DIAGNOSIS — I10 Essential (primary) hypertension: Secondary | ICD-10-CM | POA: Diagnosis not present

## 2016-03-23 DIAGNOSIS — D469 Myelodysplastic syndrome, unspecified: Secondary | ICD-10-CM | POA: Diagnosis not present

## 2016-03-24 ENCOUNTER — Ambulatory Visit: Payer: Medicare Other

## 2016-03-29 DIAGNOSIS — C189 Malignant neoplasm of colon, unspecified: Secondary | ICD-10-CM | POA: Diagnosis not present

## 2016-03-29 DIAGNOSIS — I10 Essential (primary) hypertension: Secondary | ICD-10-CM | POA: Diagnosis not present

## 2016-03-29 DIAGNOSIS — D61818 Other pancytopenia: Secondary | ICD-10-CM | POA: Diagnosis not present

## 2016-03-29 DIAGNOSIS — D469 Myelodysplastic syndrome, unspecified: Secondary | ICD-10-CM | POA: Diagnosis not present

## 2016-03-29 DIAGNOSIS — F411 Generalized anxiety disorder: Secondary | ICD-10-CM | POA: Diagnosis not present

## 2016-03-29 DIAGNOSIS — N183 Chronic kidney disease, stage 3 (moderate): Secondary | ICD-10-CM | POA: Diagnosis not present

## 2016-03-30 DIAGNOSIS — D61818 Other pancytopenia: Secondary | ICD-10-CM | POA: Diagnosis not present

## 2016-03-30 DIAGNOSIS — F411 Generalized anxiety disorder: Secondary | ICD-10-CM | POA: Diagnosis not present

## 2016-03-30 DIAGNOSIS — C189 Malignant neoplasm of colon, unspecified: Secondary | ICD-10-CM | POA: Diagnosis not present

## 2016-03-30 DIAGNOSIS — I10 Essential (primary) hypertension: Secondary | ICD-10-CM | POA: Diagnosis not present

## 2016-03-30 DIAGNOSIS — N183 Chronic kidney disease, stage 3 (moderate): Secondary | ICD-10-CM | POA: Diagnosis not present

## 2016-03-30 DIAGNOSIS — D469 Myelodysplastic syndrome, unspecified: Secondary | ICD-10-CM | POA: Diagnosis not present

## 2016-04-05 ENCOUNTER — Ambulatory Visit: Payer: Medicare Other | Admitting: Cardiology

## 2016-04-06 DIAGNOSIS — F411 Generalized anxiety disorder: Secondary | ICD-10-CM | POA: Diagnosis not present

## 2016-04-06 DIAGNOSIS — N183 Chronic kidney disease, stage 3 (moderate): Secondary | ICD-10-CM | POA: Diagnosis not present

## 2016-04-06 DIAGNOSIS — D61818 Other pancytopenia: Secondary | ICD-10-CM | POA: Diagnosis not present

## 2016-04-06 DIAGNOSIS — D469 Myelodysplastic syndrome, unspecified: Secondary | ICD-10-CM | POA: Diagnosis not present

## 2016-04-06 DIAGNOSIS — C189 Malignant neoplasm of colon, unspecified: Secondary | ICD-10-CM | POA: Diagnosis not present

## 2016-04-06 DIAGNOSIS — I10 Essential (primary) hypertension: Secondary | ICD-10-CM | POA: Diagnosis not present

## 2016-04-10 DIAGNOSIS — D469 Myelodysplastic syndrome, unspecified: Secondary | ICD-10-CM | POA: Diagnosis not present

## 2016-04-10 DIAGNOSIS — C189 Malignant neoplasm of colon, unspecified: Secondary | ICD-10-CM | POA: Diagnosis not present

## 2016-04-10 DIAGNOSIS — F411 Generalized anxiety disorder: Secondary | ICD-10-CM | POA: Diagnosis not present

## 2016-04-10 DIAGNOSIS — N183 Chronic kidney disease, stage 3 (moderate): Secondary | ICD-10-CM | POA: Diagnosis not present

## 2016-04-10 DIAGNOSIS — I10 Essential (primary) hypertension: Secondary | ICD-10-CM | POA: Diagnosis not present

## 2016-04-10 DIAGNOSIS — D61818 Other pancytopenia: Secondary | ICD-10-CM | POA: Diagnosis not present

## 2016-04-11 ENCOUNTER — Telehealth: Payer: Self-pay

## 2016-04-11 NOTE — Telephone Encounter (Signed)
[name] with hospice called stating pt keeps talking about and worrying about some lab work. She is calling to clarify. Pt has appt on 8/25 for lab and MD. Rudi Coco this information.

## 2016-04-12 ENCOUNTER — Telehealth: Payer: Self-pay | Admitting: *Deleted

## 2016-04-12 NOTE — Telephone Encounter (Signed)
Message from [place], [place] RN: Pt does not want to keep appt on 8/25. Hospice nurse can draw labs in the home and forward result to office.

## 2016-04-13 ENCOUNTER — Other Ambulatory Visit: Payer: Medicare Other

## 2016-04-13 ENCOUNTER — Ambulatory Visit: Payer: Medicare Other | Admitting: Hematology

## 2016-04-13 DIAGNOSIS — N183 Chronic kidney disease, stage 3 (moderate): Secondary | ICD-10-CM | POA: Diagnosis not present

## 2016-04-13 DIAGNOSIS — C189 Malignant neoplasm of colon, unspecified: Secondary | ICD-10-CM | POA: Diagnosis not present

## 2016-04-13 DIAGNOSIS — D61818 Other pancytopenia: Secondary | ICD-10-CM | POA: Diagnosis not present

## 2016-04-13 DIAGNOSIS — F411 Generalized anxiety disorder: Secondary | ICD-10-CM | POA: Diagnosis not present

## 2016-04-13 DIAGNOSIS — D469 Myelodysplastic syndrome, unspecified: Secondary | ICD-10-CM | POA: Diagnosis not present

## 2016-04-13 DIAGNOSIS — I10 Essential (primary) hypertension: Secondary | ICD-10-CM | POA: Diagnosis not present

## 2016-04-13 NOTE — Telephone Encounter (Signed)
Message from hospice nurse stating pt's son wants to keep appts for 8/25. Request to schedulers to add appts.

## 2016-04-14 ENCOUNTER — Ambulatory Visit (HOSPITAL_BASED_OUTPATIENT_CLINIC_OR_DEPARTMENT_OTHER): Payer: Medicare Other | Admitting: Oncology

## 2016-04-14 ENCOUNTER — Telehealth: Payer: Self-pay | Admitting: Oncology

## 2016-04-14 ENCOUNTER — Other Ambulatory Visit: Payer: Medicare Other

## 2016-04-14 ENCOUNTER — Ambulatory Visit: Payer: Medicare Other | Admitting: Oncology

## 2016-04-14 ENCOUNTER — Other Ambulatory Visit (HOSPITAL_BASED_OUTPATIENT_CLINIC_OR_DEPARTMENT_OTHER)

## 2016-04-14 ENCOUNTER — Telehealth: Payer: Self-pay | Admitting: *Deleted

## 2016-04-14 VITALS — BP 135/80 | HR 62 | Temp 98.1°F | Resp 18 | Ht 71.0 in | Wt 180.9 lb

## 2016-04-14 DIAGNOSIS — D469 Myelodysplastic syndrome, unspecified: Principal | ICD-10-CM

## 2016-04-14 DIAGNOSIS — Z85038 Personal history of other malignant neoplasm of large intestine: Secondary | ICD-10-CM

## 2016-04-14 DIAGNOSIS — IMO0001 Reserved for inherently not codable concepts without codable children: Secondary | ICD-10-CM

## 2016-04-14 DIAGNOSIS — D4620 Refractory anemia with excess of blasts, unspecified: Secondary | ICD-10-CM

## 2016-04-14 LAB — CBC WITH DIFFERENTIAL/PLATELET
BASO%: 0 % (ref 0.0–2.0)
BASOS ABS: 0 10*3/uL (ref 0.0–0.1)
EOS ABS: 0 10*3/uL (ref 0.0–0.5)
EOS%: 0 % (ref 0.0–7.0)
HEMATOCRIT: 29.3 % — AB (ref 38.4–49.9)
HEMOGLOBIN: 9.8 g/dL — AB (ref 13.0–17.1)
LYMPH#: 0.6 10*3/uL — AB (ref 0.9–3.3)
LYMPH%: 14 % (ref 14.0–49.0)
MCH: 29.1 pg (ref 27.2–33.4)
MCHC: 33.4 g/dL (ref 32.0–36.0)
MCV: 86.9 fL (ref 79.3–98.0)
MONO#: 1.7 10*3/uL — AB (ref 0.1–0.9)
MONO%: 42.6 % — ABNORMAL HIGH (ref 0.0–14.0)
NEUT#: 1.7 10*3/uL (ref 1.5–6.5)
NEUT%: 43.4 % (ref 39.0–75.0)
NRBC: 0 % (ref 0–0)
Platelets: 65 10*3/uL — ABNORMAL LOW (ref 140–400)
RBC: 3.37 10*6/uL — ABNORMAL LOW (ref 4.20–5.82)
RDW: 17.1 % — AB (ref 11.0–14.6)
WBC: 4 10*3/uL (ref 4.0–10.3)

## 2016-04-14 NOTE — Telephone Encounter (Signed)
[name] with [name] notified that Dr. Benay Spice requests a CBC at patients home in 1 month.

## 2016-04-14 NOTE — Telephone Encounter (Signed)
GAVE PATIENT AVS REPORT AND APPOINTMENTS FOR October  °

## 2016-04-14 NOTE — Progress Notes (Signed)
  [facility] OFFICE PROGRESS NOTE   Diagnosis: Myelodysplasia  INTERVAL HISTORY:   Mr.Krach returns as scheduled. He is living alone. His sons check on him daily. He is enrolled in the Hospice program. He reports discomfort in the left lower leg. He recently at a lap fall onto the right side of his face.   Objective:  Vital signs in last 24 hours:  Blood pressure 135/80, pulse 62, temperature 98.1 F (36.7 C), temperature source Oral, resp. rate 18, height '5\' 11"'$  (1.803 m), weight 180 lb 14.4 oz (82.1 kg), SpO2 100 %.    HEENT:  no thrush or bleeding Resp:  clear bilaterally, no respiratory distress Cardio:  regular rate and rhythm GI:  no hepatosplenomegaly Vascular:  trace low pretibial and ankle edema bilaterally to the legs are symmetric. No erythema. No calf tenderness. Neuro:Alert, follows commands, ambulates to the exam table  Skin:Scattered ecchymoses over the trunk and extremities, superficial abrasion/ecchymosis at the right face    Lab Results:  Lab Results  Component Value Date   WBC 4.0 04/14/2016   HGB 9.8 (L) 04/14/2016   HCT 29.3 (L) 04/14/2016   MCV 86.9 04/14/2016   PLT 65 (L) 04/14/2016   NEUTROABS 1.7 04/14/2016    Medications: I have reviewed the patient's current medications.  Assessment/Plan: 1. Stage III colon cancer diagnosed in August 2008, status post adjuvant Xeloda chemotherapy, completed in April 2009. He underwent a colonoscopy 11/16/2011 with multiple polyps. 2. History of increased tearing, status post right tear duct stent placement.  3. History of multiple colonic polyps, status post a negative colonoscopy by Dr. Benson Norway in May 2011.  4. Anxiety disorder.  5. Multiple back surgeries.  6. Hypertension.  7. Gastroesophageal reflux disease, status post a Nissen fundoplication.  8. Macular degeneration, followed by Dr. Zadie Rhine.  9. Right ear "tinnitus," and hearing loss followed by Dr. Ernesto Rutherford.  10. Severe  microcytic anemia. Ferritin returned low at 7 on 11/13/2011. He was transfused 2 units of blood. Bone marrow biopsy on 11/28/2011 confirmed decreased iron stores. The hemoglobin normalized. He continues oral iron. 11. Hemoccult positive stool. He underwent an upper endoscopy on 11/16/2011 with findings of moderate gastritis and question atypical duodenal AVMs. There was no evidence of active bleeding. Colonoscopy also on 11/16/2011 showed multiple polyps, hemorrhoids and diverticula. 12. Thrombocytopenia. Stable. 13. Mild leukopenia. Stable. 14. Mildly elevated LDH 11/13/2011. 15. Mildly elevated PT 11/13/2011. 16. 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 There was no evidence of metastatic carcinoma. Storage iron was decreased. Cytogenetic returned with a normal 76 XY karyotype. A molecular FISH panel was negative. Restaging bone marrow 02/18/2016 showed hypercellular bone marrow for age with persistent myelodysplastic state. Blast cell count slightly higher at 16%. Storage iron present. Cytogenetic analysis was normal.   Disposition:   Mr. Eskew appears unchanged. He is stable from a hematologic standpoint. We will arrange for a CBC with the Hospice program in one month. He will return for an office visit in 2 months.  Betsy Coder, MD  04/14/2016  10:38 AM

## 2016-04-18 DIAGNOSIS — D61818 Other pancytopenia: Secondary | ICD-10-CM | POA: Diagnosis not present

## 2016-04-18 DIAGNOSIS — C189 Malignant neoplasm of colon, unspecified: Secondary | ICD-10-CM | POA: Diagnosis not present

## 2016-04-18 DIAGNOSIS — D469 Myelodysplastic syndrome, unspecified: Secondary | ICD-10-CM | POA: Diagnosis not present

## 2016-04-18 DIAGNOSIS — I10 Essential (primary) hypertension: Secondary | ICD-10-CM | POA: Diagnosis not present

## 2016-04-18 DIAGNOSIS — N183 Chronic kidney disease, stage 3 (moderate): Secondary | ICD-10-CM | POA: Diagnosis not present

## 2016-04-18 DIAGNOSIS — F411 Generalized anxiety disorder: Secondary | ICD-10-CM | POA: Diagnosis not present

## 2016-04-19 DIAGNOSIS — N183 Chronic kidney disease, stage 3 (moderate): Secondary | ICD-10-CM | POA: Diagnosis not present

## 2016-04-19 DIAGNOSIS — D61818 Other pancytopenia: Secondary | ICD-10-CM | POA: Diagnosis not present

## 2016-04-19 DIAGNOSIS — I10 Essential (primary) hypertension: Secondary | ICD-10-CM | POA: Diagnosis not present

## 2016-04-19 DIAGNOSIS — F411 Generalized anxiety disorder: Secondary | ICD-10-CM | POA: Diagnosis not present

## 2016-04-19 DIAGNOSIS — C189 Malignant neoplasm of colon, unspecified: Secondary | ICD-10-CM | POA: Diagnosis not present

## 2016-04-19 DIAGNOSIS — D469 Myelodysplastic syndrome, unspecified: Secondary | ICD-10-CM | POA: Diagnosis not present

## 2016-04-20 ENCOUNTER — Telehealth: Payer: Self-pay | Admitting: *Deleted

## 2016-04-20 DIAGNOSIS — N183 Chronic kidney disease, stage 3 (moderate): Secondary | ICD-10-CM | POA: Diagnosis not present

## 2016-04-20 DIAGNOSIS — F411 Generalized anxiety disorder: Secondary | ICD-10-CM | POA: Diagnosis not present

## 2016-04-20 DIAGNOSIS — D61818 Other pancytopenia: Secondary | ICD-10-CM | POA: Diagnosis not present

## 2016-04-20 DIAGNOSIS — I10 Essential (primary) hypertension: Secondary | ICD-10-CM | POA: Diagnosis not present

## 2016-04-20 DIAGNOSIS — C189 Malignant neoplasm of colon, unspecified: Secondary | ICD-10-CM | POA: Diagnosis not present

## 2016-04-20 DIAGNOSIS — D469 Myelodysplastic syndrome, unspecified: Secondary | ICD-10-CM | POA: Diagnosis not present

## 2016-04-20 MED ORDER — TRAMADOL HCL 50 MG PO TABS: 50.0000 mg | ORAL_TABLET | Freq: Four times a day (QID) | ORAL | 0 refills | Status: DC | PRN

## 2016-04-20 NOTE — Telephone Encounter (Signed)
Call from [facility] [facility] reporting pt has fallen 2 days ago. He is taking ES Tylenol Q4 hours for pain related to fall. Stanton Kidney is requesting Rx pain med to decrease his Tylenol use.  Hospice has reinforced safety teaching with pt and he has a caregiver during the day. Discussed with Dr. Benay Spice: Order received for Tramadol 50 mg Q 6hours PRN pain. Verbal order geiven to hospice RN and called to pharmacy.

## 2016-04-21 DIAGNOSIS — N183 Chronic kidney disease, stage 3 (moderate): Secondary | ICD-10-CM | POA: Diagnosis not present

## 2016-04-21 DIAGNOSIS — K219 Gastro-esophageal reflux disease without esophagitis: Secondary | ICD-10-CM | POA: Diagnosis not present

## 2016-04-21 DIAGNOSIS — C189 Malignant neoplasm of colon, unspecified: Secondary | ICD-10-CM | POA: Diagnosis not present

## 2016-04-21 DIAGNOSIS — I10 Essential (primary) hypertension: Secondary | ICD-10-CM | POA: Diagnosis not present

## 2016-04-21 DIAGNOSIS — L209 Atopic dermatitis, unspecified: Secondary | ICD-10-CM | POA: Diagnosis not present

## 2016-04-21 DIAGNOSIS — D61818 Other pancytopenia: Secondary | ICD-10-CM | POA: Diagnosis not present

## 2016-04-21 DIAGNOSIS — H9319 Tinnitus, unspecified ear: Secondary | ICD-10-CM | POA: Diagnosis not present

## 2016-04-21 DIAGNOSIS — F411 Generalized anxiety disorder: Secondary | ICD-10-CM | POA: Diagnosis not present

## 2016-04-21 DIAGNOSIS — H3530 Unspecified macular degeneration: Secondary | ICD-10-CM | POA: Diagnosis not present

## 2016-04-21 DIAGNOSIS — D469 Myelodysplastic syndrome, unspecified: Secondary | ICD-10-CM | POA: Diagnosis not present

## 2016-04-21 DIAGNOSIS — J309 Allergic rhinitis, unspecified: Secondary | ICD-10-CM | POA: Diagnosis not present

## 2016-04-27 DIAGNOSIS — I10 Essential (primary) hypertension: Secondary | ICD-10-CM | POA: Diagnosis not present

## 2016-04-27 DIAGNOSIS — C189 Malignant neoplasm of colon, unspecified: Secondary | ICD-10-CM | POA: Diagnosis not present

## 2016-04-27 DIAGNOSIS — N183 Chronic kidney disease, stage 3 (moderate): Secondary | ICD-10-CM | POA: Diagnosis not present

## 2016-04-27 DIAGNOSIS — D61818 Other pancytopenia: Secondary | ICD-10-CM | POA: Diagnosis not present

## 2016-04-27 DIAGNOSIS — F411 Generalized anxiety disorder: Secondary | ICD-10-CM | POA: Diagnosis not present

## 2016-04-27 DIAGNOSIS — D469 Myelodysplastic syndrome, unspecified: Secondary | ICD-10-CM | POA: Diagnosis not present

## 2016-05-04 DIAGNOSIS — F411 Generalized anxiety disorder: Secondary | ICD-10-CM | POA: Diagnosis not present

## 2016-05-04 DIAGNOSIS — C189 Malignant neoplasm of colon, unspecified: Secondary | ICD-10-CM | POA: Diagnosis not present

## 2016-05-04 DIAGNOSIS — D61818 Other pancytopenia: Secondary | ICD-10-CM | POA: Diagnosis not present

## 2016-05-04 DIAGNOSIS — N183 Chronic kidney disease, stage 3 (moderate): Secondary | ICD-10-CM | POA: Diagnosis not present

## 2016-05-04 DIAGNOSIS — I10 Essential (primary) hypertension: Secondary | ICD-10-CM | POA: Diagnosis not present

## 2016-05-04 DIAGNOSIS — D469 Myelodysplastic syndrome, unspecified: Secondary | ICD-10-CM | POA: Diagnosis not present

## 2016-05-09 DIAGNOSIS — F411 Generalized anxiety disorder: Secondary | ICD-10-CM | POA: Diagnosis not present

## 2016-05-09 DIAGNOSIS — I10 Essential (primary) hypertension: Secondary | ICD-10-CM | POA: Diagnosis not present

## 2016-05-09 DIAGNOSIS — D61818 Other pancytopenia: Secondary | ICD-10-CM | POA: Diagnosis not present

## 2016-05-09 DIAGNOSIS — C189 Malignant neoplasm of colon, unspecified: Secondary | ICD-10-CM | POA: Diagnosis not present

## 2016-05-09 DIAGNOSIS — N183 Chronic kidney disease, stage 3 (moderate): Secondary | ICD-10-CM | POA: Diagnosis not present

## 2016-05-09 DIAGNOSIS — D469 Myelodysplastic syndrome, unspecified: Secondary | ICD-10-CM | POA: Diagnosis not present

## 2016-05-11 ENCOUNTER — Telehealth: Payer: Self-pay | Admitting: *Deleted

## 2016-05-11 ENCOUNTER — Other Ambulatory Visit: Payer: Self-pay | Admitting: Oncology

## 2016-05-11 DIAGNOSIS — C189 Malignant neoplasm of colon, unspecified: Secondary | ICD-10-CM | POA: Diagnosis not present

## 2016-05-11 DIAGNOSIS — D61818 Other pancytopenia: Secondary | ICD-10-CM | POA: Diagnosis not present

## 2016-05-11 DIAGNOSIS — N183 Chronic kidney disease, stage 3 (moderate): Secondary | ICD-10-CM | POA: Diagnosis not present

## 2016-05-11 DIAGNOSIS — D469 Myelodysplastic syndrome, unspecified: Secondary | ICD-10-CM | POA: Diagnosis not present

## 2016-05-11 DIAGNOSIS — I10 Essential (primary) hypertension: Secondary | ICD-10-CM | POA: Diagnosis not present

## 2016-05-11 DIAGNOSIS — F411 Generalized anxiety disorder: Secondary | ICD-10-CM | POA: Diagnosis not present

## 2016-05-11 NOTE — Telephone Encounter (Signed)
Call placed back to patient's son to inform him per Dr. Benay Spice that patient can receive flu shot at next appointment in October.  Pt.'s son appreciative of call back.

## 2016-05-11 NOTE — Telephone Encounter (Signed)
Per Dr. Benay Spice, [place] for pt to receive flu shot at October appointment and to continue monthly CBC.  Patient and patient's family had requested monthly CBC per Dr. Benay Spice.  Call placed back to [facility] and message left to inform her of orders from Dr. Benay Spice. Instructed Jocelyn Lamer to call back with any other questions or concerns.

## 2016-05-11 NOTE — Telephone Encounter (Signed)
Received call from pt's son & asking if pt can get flu shot.  He asked that he be called back & OK to leave message on cell [phone] or home [phone].  Message to Dr Sherrill/Pod RN

## 2016-05-11 NOTE — Telephone Encounter (Signed)
Received call from Vicki/RN/Hospice/[place] also asking about flu shot & wanting to know if pt can get at this office when he comes in Oct.  She also wants to know since pt is hospice & Dr Benay Spice ordered a cbc to be done in one month from 04/14/16 & their focus is comfort/care management, is this something that Dr Benay Spice will want monthly?  She is visiting today @ 2:30 p & would like a call back on her cell @ [phone].  Message routed to Dr Sherrill/Pod RN

## 2016-05-12 ENCOUNTER — Telehealth: Payer: Self-pay | Admitting: *Deleted

## 2016-05-12 ENCOUNTER — Other Ambulatory Visit: Payer: Self-pay | Admitting: Oncology

## 2016-05-12 DIAGNOSIS — N183 Chronic kidney disease, stage 3 (moderate): Secondary | ICD-10-CM | POA: Diagnosis not present

## 2016-05-12 DIAGNOSIS — D61818 Other pancytopenia: Secondary | ICD-10-CM | POA: Diagnosis not present

## 2016-05-12 DIAGNOSIS — I10 Essential (primary) hypertension: Secondary | ICD-10-CM | POA: Diagnosis not present

## 2016-05-12 DIAGNOSIS — C189 Malignant neoplasm of colon, unspecified: Secondary | ICD-10-CM | POA: Diagnosis not present

## 2016-05-12 DIAGNOSIS — D469 Myelodysplastic syndrome, unspecified: Secondary | ICD-10-CM | POA: Diagnosis not present

## 2016-05-12 DIAGNOSIS — F411 Generalized anxiety disorder: Secondary | ICD-10-CM | POA: Diagnosis not present

## 2016-05-12 NOTE — Telephone Encounter (Signed)
Message received that hospice nurse, Jocelyn Lamer called after hours to review patient's medication list.  Message left with Jocelyn Lamer to call [place] back for clarification of medications.

## 2016-05-15 ENCOUNTER — Telehealth: Payer: Self-pay | Admitting: *Deleted

## 2016-05-15 NOTE — Telephone Encounter (Signed)
Received copy of CBC from hospice. Placed on Dr. Gearldine Shown desk for review. Returned call to [place], RN informing her copy has been received. Labs are stable for pt.

## 2016-05-16 ENCOUNTER — Telehealth: Payer: Self-pay

## 2016-05-17 ENCOUNTER — Telehealth: Payer: Self-pay | Admitting: *Deleted

## 2016-05-17 DIAGNOSIS — N183 Chronic kidney disease, stage 3 (moderate): Secondary | ICD-10-CM | POA: Diagnosis not present

## 2016-05-17 DIAGNOSIS — D469 Myelodysplastic syndrome, unspecified: Secondary | ICD-10-CM | POA: Diagnosis not present

## 2016-05-17 DIAGNOSIS — F411 Generalized anxiety disorder: Secondary | ICD-10-CM | POA: Diagnosis not present

## 2016-05-17 DIAGNOSIS — I10 Essential (primary) hypertension: Secondary | ICD-10-CM | POA: Diagnosis not present

## 2016-05-17 DIAGNOSIS — C189 Malignant neoplasm of colon, unspecified: Secondary | ICD-10-CM | POA: Diagnosis not present

## 2016-05-17 DIAGNOSIS — D61818 Other pancytopenia: Secondary | ICD-10-CM | POA: Diagnosis not present

## 2016-05-17 NOTE — Telephone Encounter (Signed)
Ok to take senokot

## 2016-05-17 NOTE — Telephone Encounter (Signed)
Message from [place], Hospice RN: Pt is taking Mucinex 600 mg PRN. Pt would rather take Senokot than Colace. Pt is also taking Aleve instead of Tylenol. He is not taking Benadryl.  Requested RN educate pt on risk of bleeding with Aleve. He has Tramadol or Tylenol to take PRN pain.  Vickie will address this with pt. Medication list updated. Message to Dr. Benay Spice for review.

## 2016-05-18 DIAGNOSIS — D61818 Other pancytopenia: Secondary | ICD-10-CM | POA: Diagnosis not present

## 2016-05-18 DIAGNOSIS — D469 Myelodysplastic syndrome, unspecified: Secondary | ICD-10-CM | POA: Diagnosis not present

## 2016-05-18 DIAGNOSIS — C189 Malignant neoplasm of colon, unspecified: Secondary | ICD-10-CM | POA: Diagnosis not present

## 2016-05-18 DIAGNOSIS — N183 Chronic kidney disease, stage 3 (moderate): Secondary | ICD-10-CM | POA: Diagnosis not present

## 2016-05-18 DIAGNOSIS — I10 Essential (primary) hypertension: Secondary | ICD-10-CM | POA: Diagnosis not present

## 2016-05-18 DIAGNOSIS — F411 Generalized anxiety disorder: Secondary | ICD-10-CM | POA: Diagnosis not present

## 2016-05-21 DIAGNOSIS — D61818 Other pancytopenia: Secondary | ICD-10-CM | POA: Diagnosis not present

## 2016-05-21 DIAGNOSIS — J309 Allergic rhinitis, unspecified: Secondary | ICD-10-CM | POA: Diagnosis not present

## 2016-05-21 DIAGNOSIS — D469 Myelodysplastic syndrome, unspecified: Secondary | ICD-10-CM | POA: Diagnosis not present

## 2016-05-21 DIAGNOSIS — L209 Atopic dermatitis, unspecified: Secondary | ICD-10-CM | POA: Diagnosis not present

## 2016-05-21 DIAGNOSIS — F411 Generalized anxiety disorder: Secondary | ICD-10-CM | POA: Diagnosis not present

## 2016-05-21 DIAGNOSIS — K219 Gastro-esophageal reflux disease without esophagitis: Secondary | ICD-10-CM | POA: Diagnosis not present

## 2016-05-21 DIAGNOSIS — N183 Chronic kidney disease, stage 3 (moderate): Secondary | ICD-10-CM | POA: Diagnosis not present

## 2016-05-21 DIAGNOSIS — C189 Malignant neoplasm of colon, unspecified: Secondary | ICD-10-CM | POA: Diagnosis not present

## 2016-05-21 DIAGNOSIS — H3530 Unspecified macular degeneration: Secondary | ICD-10-CM | POA: Diagnosis not present

## 2016-05-21 DIAGNOSIS — I10 Essential (primary) hypertension: Secondary | ICD-10-CM | POA: Diagnosis not present

## 2016-05-21 DIAGNOSIS — H9319 Tinnitus, unspecified ear: Secondary | ICD-10-CM | POA: Diagnosis not present

## 2016-05-25 DIAGNOSIS — N183 Chronic kidney disease, stage 3 (moderate): Secondary | ICD-10-CM | POA: Diagnosis not present

## 2016-05-25 DIAGNOSIS — I10 Essential (primary) hypertension: Secondary | ICD-10-CM | POA: Diagnosis not present

## 2016-05-25 DIAGNOSIS — C189 Malignant neoplasm of colon, unspecified: Secondary | ICD-10-CM | POA: Diagnosis not present

## 2016-05-25 DIAGNOSIS — D61818 Other pancytopenia: Secondary | ICD-10-CM | POA: Diagnosis not present

## 2016-05-25 DIAGNOSIS — F411 Generalized anxiety disorder: Secondary | ICD-10-CM | POA: Diagnosis not present

## 2016-05-25 DIAGNOSIS — D469 Myelodysplastic syndrome, unspecified: Secondary | ICD-10-CM | POA: Diagnosis not present

## 2016-06-01 DIAGNOSIS — N183 Chronic kidney disease, stage 3 (moderate): Secondary | ICD-10-CM | POA: Diagnosis not present

## 2016-06-01 DIAGNOSIS — D61818 Other pancytopenia: Secondary | ICD-10-CM | POA: Diagnosis not present

## 2016-06-01 DIAGNOSIS — I10 Essential (primary) hypertension: Secondary | ICD-10-CM | POA: Diagnosis not present

## 2016-06-01 DIAGNOSIS — D469 Myelodysplastic syndrome, unspecified: Secondary | ICD-10-CM | POA: Diagnosis not present

## 2016-06-01 DIAGNOSIS — F411 Generalized anxiety disorder: Secondary | ICD-10-CM | POA: Diagnosis not present

## 2016-06-01 DIAGNOSIS — C189 Malignant neoplasm of colon, unspecified: Secondary | ICD-10-CM | POA: Diagnosis not present

## 2016-06-02 DIAGNOSIS — N183 Chronic kidney disease, stage 3 (moderate): Secondary | ICD-10-CM | POA: Diagnosis not present

## 2016-06-02 DIAGNOSIS — F411 Generalized anxiety disorder: Secondary | ICD-10-CM | POA: Diagnosis not present

## 2016-06-02 DIAGNOSIS — D61818 Other pancytopenia: Secondary | ICD-10-CM | POA: Diagnosis not present

## 2016-06-02 DIAGNOSIS — I10 Essential (primary) hypertension: Secondary | ICD-10-CM | POA: Diagnosis not present

## 2016-06-02 DIAGNOSIS — C189 Malignant neoplasm of colon, unspecified: Secondary | ICD-10-CM | POA: Diagnosis not present

## 2016-06-02 DIAGNOSIS — D469 Myelodysplastic syndrome, unspecified: Secondary | ICD-10-CM | POA: Diagnosis not present

## 2016-06-07 DIAGNOSIS — C189 Malignant neoplasm of colon, unspecified: Secondary | ICD-10-CM | POA: Diagnosis not present

## 2016-06-07 DIAGNOSIS — D61818 Other pancytopenia: Secondary | ICD-10-CM | POA: Diagnosis not present

## 2016-06-07 DIAGNOSIS — N183 Chronic kidney disease, stage 3 (moderate): Secondary | ICD-10-CM | POA: Diagnosis not present

## 2016-06-07 DIAGNOSIS — I10 Essential (primary) hypertension: Secondary | ICD-10-CM | POA: Diagnosis not present

## 2016-06-07 DIAGNOSIS — D469 Myelodysplastic syndrome, unspecified: Secondary | ICD-10-CM | POA: Diagnosis not present

## 2016-06-07 DIAGNOSIS — F411 Generalized anxiety disorder: Secondary | ICD-10-CM | POA: Diagnosis not present

## 2016-06-08 DIAGNOSIS — D61818 Other pancytopenia: Secondary | ICD-10-CM | POA: Diagnosis not present

## 2016-06-08 DIAGNOSIS — F411 Generalized anxiety disorder: Secondary | ICD-10-CM | POA: Diagnosis not present

## 2016-06-08 DIAGNOSIS — N183 Chronic kidney disease, stage 3 (moderate): Secondary | ICD-10-CM | POA: Diagnosis not present

## 2016-06-08 DIAGNOSIS — C189 Malignant neoplasm of colon, unspecified: Secondary | ICD-10-CM | POA: Diagnosis not present

## 2016-06-08 DIAGNOSIS — I10 Essential (primary) hypertension: Secondary | ICD-10-CM | POA: Diagnosis not present

## 2016-06-08 DIAGNOSIS — D469 Myelodysplastic syndrome, unspecified: Secondary | ICD-10-CM | POA: Diagnosis not present

## 2016-06-09 ENCOUNTER — Other Ambulatory Visit (HOSPITAL_BASED_OUTPATIENT_CLINIC_OR_DEPARTMENT_OTHER)

## 2016-06-09 ENCOUNTER — Ambulatory Visit (HOSPITAL_BASED_OUTPATIENT_CLINIC_OR_DEPARTMENT_OTHER): Admitting: Oncology

## 2016-06-09 ENCOUNTER — Telehealth: Payer: Self-pay | Admitting: Oncology

## 2016-06-09 VITALS — BP 130/55 | HR 52 | Temp 98.4°F | Resp 18 | Ht 71.0 in | Wt 176.2 lb

## 2016-06-09 DIAGNOSIS — D6189 Other specified aplastic anemias and other bone marrow failure syndromes: Secondary | ICD-10-CM

## 2016-06-09 DIAGNOSIS — Z23 Encounter for immunization: Secondary | ICD-10-CM | POA: Diagnosis not present

## 2016-06-09 DIAGNOSIS — IMO0001 Reserved for inherently not codable concepts without codable children: Secondary | ICD-10-CM

## 2016-06-09 DIAGNOSIS — D4620 Refractory anemia with excess of blasts, unspecified: Secondary | ICD-10-CM | POA: Diagnosis not present

## 2016-06-09 DIAGNOSIS — Z85038 Personal history of other malignant neoplasm of large intestine: Secondary | ICD-10-CM

## 2016-06-09 DIAGNOSIS — D469 Myelodysplastic syndrome, unspecified: Principal | ICD-10-CM

## 2016-06-09 LAB — CBC WITH DIFFERENTIAL/PLATELET
BASO%: 0 % (ref 0.0–2.0)
BASOS ABS: 0 10*3/uL (ref 0.0–0.1)
EOS ABS: 0 10*3/uL (ref 0.0–0.5)
EOS%: 0.3 % (ref 0.0–7.0)
HCT: 31.4 % — ABNORMAL LOW (ref 38.4–49.9)
HEMOGLOBIN: 10.4 g/dL — AB (ref 13.0–17.1)
LYMPH#: 0.7 10*3/uL — AB (ref 0.9–3.3)
LYMPH%: 23.7 % (ref 14.0–49.0)
MCH: 29.6 pg (ref 27.2–33.4)
MCHC: 33.1 g/dL (ref 32.0–36.0)
MCV: 89.5 fL (ref 79.3–98.0)
MONO#: 1.2 10*3/uL — ABNORMAL HIGH (ref 0.1–0.9)
MONO%: 39.5 % — ABNORMAL HIGH (ref 0.0–14.0)
NEUT#: 1.1 10*3/uL — ABNORMAL LOW (ref 1.5–6.5)
NEUT%: 36.5 % — ABNORMAL LOW (ref 39.0–75.0)
NRBC: 0 % (ref 0–0)
Platelets: 49 10*3/uL — ABNORMAL LOW (ref 140–400)
RBC: 3.51 10*6/uL — ABNORMAL LOW (ref 4.20–5.82)
RDW: 19.3 % — AB (ref 11.0–14.6)
WBC: 3 10*3/uL — ABNORMAL LOW (ref 4.0–10.3)

## 2016-06-09 LAB — TECHNOLOGIST REVIEW

## 2016-06-09 MED ORDER — INFLUENZA VAC SPLIT QUAD 0.5 ML IM SUSY
0.5000 mL | PREFILLED_SYRINGE | Freq: Once | INTRAMUSCULAR | Status: AC
  Administered 2016-06-09: 0.5 mL via INTRAMUSCULAR
  Filled 2016-06-09: qty 0.5

## 2016-06-09 NOTE — Telephone Encounter (Signed)
AVS report and appointment schedule given to patient, per 06/09/16 los. °

## 2016-06-09 NOTE — Progress Notes (Signed)
  [name] OFFICE PROGRESS NOTE   Diagnosis: Myelodysplasia  INTERVAL HISTORY:   [name] returns as scheduled. He is followed by the hospice RN each week. No falls. No bleeding other than easy bruising. No fever or infection. He reports a good appetite.  Objective:  Vital signs in last 24 hours:  Blood pressure (!) 130/55, pulse (!) 52, temperature 98.4 F (36.9 C), temperature source Oral, resp. rate 18, height '5\' 11"'$  (1.803 m), weight 176 lb 3.2 oz (79.9 kg), SpO2 100 %.    HEENT: [place] over the tongue, no buccal thrush Resp: Distant breath sounds, no respiratory distress Cardio: Irregular GI: No hepatosplenomegaly, mild tenderness near the umbilicus, no mass Vascular: No leg edema  Skin: Scattered ecchymoses over the arms     Lab Results:  Lab Results  Component Value Date   WBC 3.0 (L) 06/09/2016   HGB 10.4 (L) 06/09/2016   HCT 31.4 (L) 06/09/2016   MCV 89.5 06/09/2016   PLT 49 (L) 06/09/2016   NEUTROABS 1.1 (L) 06/09/2016    Medications: I have reviewed the patient's current medications.  Assessment/Plan: 1. Stage III colon cancer diagnosed in August 2008, status post adjuvant Xeloda chemotherapy, completed in April 2009. He underwent a colonoscopy 11/16/2011 with multiple polyps. 2. History of increased tearing, status post right tear duct stent placement.  3. History of multiple colonic polyps, status post a negative colonoscopy by Dr. Benson Norway in May 2011.  4. Anxiety disorder.  5. Multiple back surgeries.  6. Hypertension.  7. Gastroesophageal reflux disease, status post a Nissen fundoplication.  8. Macular degeneration, followed by Dr. Zadie Rhine.  9. Right ear "tinnitus," and hearing loss followed by Dr. Ernesto Rutherford.  10. Severe microcytic anemia. Ferritin returned low at 7 on 11/13/2011. He was transfused 2 units of blood. Bone marrow biopsy on 11/28/2011 confirmed decreased iron stores. The hemoglobin normalized. He continues oral  iron. 11. Hemoccult positive stool. He underwent an upper endoscopy on 11/16/2011 with findings of moderate gastritis and question atypical duodenal AVMs. There was no evidence of active bleeding. Colonoscopy also on 11/16/2011 showed multiple polyps, hemorrhoids and diverticula. 12. Thrombocytopenia. Stable. 13. Mild leukopenia. Stable. 14. Mildly elevated LDH 11/13/2011. 15. Mildly elevated PT 11/13/2011. 16. 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 There was no evidence of metastatic carcinoma. Storage iron was decreased. Cytogenetic returned with a normal 81 XY karyotype. A molecular FISH panel was negative. Restaging bone marrow 02/18/2016 showed hypercellular bone marrow for age with persistent myelodysplastic state. Blast cell count slightly higher at 16%. Storage iron present. Cytogenetic analysis was normal.    Disposition:  [name] is stable from a hematologic standpoint. He will return for an office visit and CBC in 2 months. He received an influenza vaccine today. [name] will continue follow-up with the home Hospice program.  Betsy Coder, MD  06/09/2016  10:45 AM

## 2016-06-15 ENCOUNTER — Telehealth: Payer: Self-pay | Admitting: *Deleted

## 2016-06-15 ENCOUNTER — Other Ambulatory Visit: Payer: Self-pay | Admitting: *Deleted

## 2016-06-15 DIAGNOSIS — F411 Generalized anxiety disorder: Secondary | ICD-10-CM | POA: Diagnosis not present

## 2016-06-15 DIAGNOSIS — D469 Myelodysplastic syndrome, unspecified: Secondary | ICD-10-CM

## 2016-06-15 DIAGNOSIS — N183 Chronic kidney disease, stage 3 (moderate): Secondary | ICD-10-CM | POA: Diagnosis not present

## 2016-06-15 DIAGNOSIS — C189 Malignant neoplasm of colon, unspecified: Secondary | ICD-10-CM | POA: Diagnosis not present

## 2016-06-15 DIAGNOSIS — D61818 Other pancytopenia: Secondary | ICD-10-CM | POA: Diagnosis not present

## 2016-06-15 DIAGNOSIS — I10 Essential (primary) hypertension: Secondary | ICD-10-CM | POA: Diagnosis not present

## 2016-06-15 MED ORDER — TRAMADOL HCL 50 MG PO TABS: 50.0000 mg | ORAL_TABLET | Freq: Four times a day (QID) | ORAL | 0 refills | Status: DC | PRN

## 2016-06-15 NOTE — Telephone Encounter (Signed)
Received call from [place], [place] @ [place] requesting refill of Tramadol for pt.  Script can be sent to [facility] on [address] as per pt's request. Vicky's    Phone    [phone].

## 2016-06-19 ENCOUNTER — Ambulatory Visit: Payer: Medicare Other | Admitting: Oncology

## 2016-06-19 ENCOUNTER — Other Ambulatory Visit: Payer: Medicare Other

## 2016-06-21 DIAGNOSIS — H3530 Unspecified macular degeneration: Secondary | ICD-10-CM | POA: Diagnosis not present

## 2016-06-21 DIAGNOSIS — D469 Myelodysplastic syndrome, unspecified: Secondary | ICD-10-CM | POA: Diagnosis not present

## 2016-06-21 DIAGNOSIS — F411 Generalized anxiety disorder: Secondary | ICD-10-CM | POA: Diagnosis not present

## 2016-06-21 DIAGNOSIS — J309 Allergic rhinitis, unspecified: Secondary | ICD-10-CM | POA: Diagnosis not present

## 2016-06-21 DIAGNOSIS — N183 Chronic kidney disease, stage 3 (moderate): Secondary | ICD-10-CM | POA: Diagnosis not present

## 2016-06-21 DIAGNOSIS — C189 Malignant neoplasm of colon, unspecified: Secondary | ICD-10-CM | POA: Diagnosis not present

## 2016-06-21 DIAGNOSIS — L209 Atopic dermatitis, unspecified: Secondary | ICD-10-CM | POA: Diagnosis not present

## 2016-06-21 DIAGNOSIS — D61818 Other pancytopenia: Secondary | ICD-10-CM | POA: Diagnosis not present

## 2016-06-21 DIAGNOSIS — I10 Essential (primary) hypertension: Secondary | ICD-10-CM | POA: Diagnosis not present

## 2016-06-21 DIAGNOSIS — H9319 Tinnitus, unspecified ear: Secondary | ICD-10-CM | POA: Diagnosis not present

## 2016-06-21 DIAGNOSIS — K219 Gastro-esophageal reflux disease without esophagitis: Secondary | ICD-10-CM | POA: Diagnosis not present

## 2016-06-22 DIAGNOSIS — F411 Generalized anxiety disorder: Secondary | ICD-10-CM | POA: Diagnosis not present

## 2016-06-22 DIAGNOSIS — C189 Malignant neoplasm of colon, unspecified: Secondary | ICD-10-CM | POA: Diagnosis not present

## 2016-06-22 DIAGNOSIS — D61818 Other pancytopenia: Secondary | ICD-10-CM | POA: Diagnosis not present

## 2016-06-22 DIAGNOSIS — D469 Myelodysplastic syndrome, unspecified: Secondary | ICD-10-CM | POA: Diagnosis not present

## 2016-06-22 DIAGNOSIS — I10 Essential (primary) hypertension: Secondary | ICD-10-CM | POA: Diagnosis not present

## 2016-06-22 DIAGNOSIS — N183 Chronic kidney disease, stage 3 (moderate): Secondary | ICD-10-CM | POA: Diagnosis not present

## 2016-06-29 ENCOUNTER — Other Ambulatory Visit: Payer: Self-pay | Admitting: Nurse Practitioner

## 2016-06-29 DIAGNOSIS — F411 Generalized anxiety disorder: Secondary | ICD-10-CM | POA: Diagnosis not present

## 2016-06-29 DIAGNOSIS — C189 Malignant neoplasm of colon, unspecified: Secondary | ICD-10-CM | POA: Diagnosis not present

## 2016-06-29 DIAGNOSIS — D61818 Other pancytopenia: Secondary | ICD-10-CM | POA: Diagnosis not present

## 2016-06-29 DIAGNOSIS — D469 Myelodysplastic syndrome, unspecified: Secondary | ICD-10-CM

## 2016-06-29 DIAGNOSIS — I10 Essential (primary) hypertension: Secondary | ICD-10-CM | POA: Diagnosis not present

## 2016-06-29 DIAGNOSIS — N183 Chronic kidney disease, stage 3 (moderate): Secondary | ICD-10-CM | POA: Diagnosis not present

## 2016-06-30 DIAGNOSIS — D469 Myelodysplastic syndrome, unspecified: Secondary | ICD-10-CM | POA: Diagnosis not present

## 2016-06-30 DIAGNOSIS — D61818 Other pancytopenia: Secondary | ICD-10-CM | POA: Diagnosis not present

## 2016-06-30 DIAGNOSIS — N183 Chronic kidney disease, stage 3 (moderate): Secondary | ICD-10-CM | POA: Diagnosis not present

## 2016-06-30 DIAGNOSIS — C189 Malignant neoplasm of colon, unspecified: Secondary | ICD-10-CM | POA: Diagnosis not present

## 2016-06-30 DIAGNOSIS — F411 Generalized anxiety disorder: Secondary | ICD-10-CM | POA: Diagnosis not present

## 2016-06-30 DIAGNOSIS — I10 Essential (primary) hypertension: Secondary | ICD-10-CM | POA: Diagnosis not present

## 2016-07-04 ENCOUNTER — Telehealth: Payer: Self-pay

## 2016-07-04 DIAGNOSIS — D469 Myelodysplastic syndrome, unspecified: Secondary | ICD-10-CM | POA: Diagnosis not present

## 2016-07-04 DIAGNOSIS — I10 Essential (primary) hypertension: Secondary | ICD-10-CM | POA: Diagnosis not present

## 2016-07-04 DIAGNOSIS — F411 Generalized anxiety disorder: Secondary | ICD-10-CM | POA: Diagnosis not present

## 2016-07-04 DIAGNOSIS — C189 Malignant neoplasm of colon, unspecified: Secondary | ICD-10-CM | POA: Diagnosis not present

## 2016-07-04 DIAGNOSIS — N183 Chronic kidney disease, stage 3 (moderate): Secondary | ICD-10-CM | POA: Diagnosis not present

## 2016-07-04 DIAGNOSIS — D61818 Other pancytopenia: Secondary | ICD-10-CM | POA: Diagnosis not present

## 2016-07-04 MED ORDER — TRAMADOL HCL 50 MG PO TABS: 50.0000 mg | ORAL_TABLET | Freq: Four times a day (QID) | ORAL | 0 refills | Status: DC | PRN

## 2016-07-04 NOTE — Telephone Encounter (Signed)
Called son, Edgel Vi about tramadol rx. He will send his brother to pick up prescription at [facility].   Rx called into [facility] on [address].

## 2016-07-05 DIAGNOSIS — D61818 Other pancytopenia: Secondary | ICD-10-CM | POA: Diagnosis not present

## 2016-07-05 DIAGNOSIS — D469 Myelodysplastic syndrome, unspecified: Secondary | ICD-10-CM | POA: Diagnosis not present

## 2016-07-05 DIAGNOSIS — N183 Chronic kidney disease, stage 3 (moderate): Secondary | ICD-10-CM | POA: Diagnosis not present

## 2016-07-05 DIAGNOSIS — I10 Essential (primary) hypertension: Secondary | ICD-10-CM | POA: Diagnosis not present

## 2016-07-05 DIAGNOSIS — F411 Generalized anxiety disorder: Secondary | ICD-10-CM | POA: Diagnosis not present

## 2016-07-05 DIAGNOSIS — C189 Malignant neoplasm of colon, unspecified: Secondary | ICD-10-CM | POA: Diagnosis not present

## 2016-07-06 DIAGNOSIS — D469 Myelodysplastic syndrome, unspecified: Secondary | ICD-10-CM | POA: Diagnosis not present

## 2016-07-06 DIAGNOSIS — F411 Generalized anxiety disorder: Secondary | ICD-10-CM | POA: Diagnosis not present

## 2016-07-06 DIAGNOSIS — N183 Chronic kidney disease, stage 3 (moderate): Secondary | ICD-10-CM | POA: Diagnosis not present

## 2016-07-06 DIAGNOSIS — C189 Malignant neoplasm of colon, unspecified: Secondary | ICD-10-CM | POA: Diagnosis not present

## 2016-07-06 DIAGNOSIS — D61818 Other pancytopenia: Secondary | ICD-10-CM | POA: Diagnosis not present

## 2016-07-06 DIAGNOSIS — I10 Essential (primary) hypertension: Secondary | ICD-10-CM | POA: Diagnosis not present

## 2016-07-12 DIAGNOSIS — D469 Myelodysplastic syndrome, unspecified: Secondary | ICD-10-CM | POA: Diagnosis not present

## 2016-07-12 DIAGNOSIS — I10 Essential (primary) hypertension: Secondary | ICD-10-CM | POA: Diagnosis not present

## 2016-07-12 DIAGNOSIS — F411 Generalized anxiety disorder: Secondary | ICD-10-CM | POA: Diagnosis not present

## 2016-07-12 DIAGNOSIS — C189 Malignant neoplasm of colon, unspecified: Secondary | ICD-10-CM | POA: Diagnosis not present

## 2016-07-12 DIAGNOSIS — N183 Chronic kidney disease, stage 3 (moderate): Secondary | ICD-10-CM | POA: Diagnosis not present

## 2016-07-12 DIAGNOSIS — D61818 Other pancytopenia: Secondary | ICD-10-CM | POA: Diagnosis not present

## 2016-07-19 DIAGNOSIS — I10 Essential (primary) hypertension: Secondary | ICD-10-CM | POA: Diagnosis not present

## 2016-07-19 DIAGNOSIS — D469 Myelodysplastic syndrome, unspecified: Secondary | ICD-10-CM | POA: Diagnosis not present

## 2016-07-19 DIAGNOSIS — F411 Generalized anxiety disorder: Secondary | ICD-10-CM | POA: Diagnosis not present

## 2016-07-19 DIAGNOSIS — C189 Malignant neoplasm of colon, unspecified: Secondary | ICD-10-CM | POA: Diagnosis not present

## 2016-07-19 DIAGNOSIS — D61818 Other pancytopenia: Secondary | ICD-10-CM | POA: Diagnosis not present

## 2016-07-19 DIAGNOSIS — N183 Chronic kidney disease, stage 3 (moderate): Secondary | ICD-10-CM | POA: Diagnosis not present

## 2016-07-21 DIAGNOSIS — K219 Gastro-esophageal reflux disease without esophagitis: Secondary | ICD-10-CM | POA: Diagnosis not present

## 2016-07-21 DIAGNOSIS — H3530 Unspecified macular degeneration: Secondary | ICD-10-CM | POA: Diagnosis not present

## 2016-07-21 DIAGNOSIS — H9319 Tinnitus, unspecified ear: Secondary | ICD-10-CM | POA: Diagnosis not present

## 2016-07-21 DIAGNOSIS — F411 Generalized anxiety disorder: Secondary | ICD-10-CM | POA: Diagnosis not present

## 2016-07-21 DIAGNOSIS — I10 Essential (primary) hypertension: Secondary | ICD-10-CM | POA: Diagnosis not present

## 2016-07-21 DIAGNOSIS — D61818 Other pancytopenia: Secondary | ICD-10-CM | POA: Diagnosis not present

## 2016-07-21 DIAGNOSIS — J309 Allergic rhinitis, unspecified: Secondary | ICD-10-CM | POA: Diagnosis not present

## 2016-07-21 DIAGNOSIS — D469 Myelodysplastic syndrome, unspecified: Secondary | ICD-10-CM | POA: Diagnosis not present

## 2016-07-21 DIAGNOSIS — L209 Atopic dermatitis, unspecified: Secondary | ICD-10-CM | POA: Diagnosis not present

## 2016-07-21 DIAGNOSIS — C189 Malignant neoplasm of colon, unspecified: Secondary | ICD-10-CM | POA: Diagnosis not present

## 2016-07-21 DIAGNOSIS — N183 Chronic kidney disease, stage 3 (moderate): Secondary | ICD-10-CM | POA: Diagnosis not present

## 2016-07-26 ENCOUNTER — Telehealth: Payer: Self-pay | Admitting: Medical Oncology

## 2016-07-26 ENCOUNTER — Telehealth: Payer: Self-pay

## 2016-07-26 DIAGNOSIS — F411 Generalized anxiety disorder: Secondary | ICD-10-CM | POA: Diagnosis not present

## 2016-07-26 DIAGNOSIS — I10 Essential (primary) hypertension: Secondary | ICD-10-CM | POA: Diagnosis not present

## 2016-07-26 DIAGNOSIS — C189 Malignant neoplasm of colon, unspecified: Secondary | ICD-10-CM | POA: Diagnosis not present

## 2016-07-26 DIAGNOSIS — N183 Chronic kidney disease, stage 3 (moderate): Secondary | ICD-10-CM | POA: Diagnosis not present

## 2016-07-26 DIAGNOSIS — D61818 Other pancytopenia: Secondary | ICD-10-CM | POA: Diagnosis not present

## 2016-07-26 DIAGNOSIS — D469 Myelodysplastic syndrome, unspecified: Secondary | ICD-10-CM | POA: Diagnosis not present

## 2016-07-26 NOTE — Telephone Encounter (Signed)
Called patient and informed him the labs that hospice drew were compared to labs drawn on 10/20 by MD. Labs are stable and continue to show persistant anemia and thrombocytopenia. Pt verbalized understanding.

## 2016-07-26 NOTE — Telephone Encounter (Signed)
I returned call and left message that we do not have labs from last week and to fax to Dr Bernette Redbird nurse fax.

## 2016-07-27 ENCOUNTER — Other Ambulatory Visit: Payer: Self-pay | Admitting: *Deleted

## 2016-07-27 ENCOUNTER — Telehealth: Payer: Self-pay | Admitting: *Deleted

## 2016-07-27 DIAGNOSIS — F411 Generalized anxiety disorder: Secondary | ICD-10-CM | POA: Diagnosis not present

## 2016-07-27 DIAGNOSIS — C189 Malignant neoplasm of colon, unspecified: Secondary | ICD-10-CM | POA: Diagnosis not present

## 2016-07-27 DIAGNOSIS — D469 Myelodysplastic syndrome, unspecified: Secondary | ICD-10-CM | POA: Diagnosis not present

## 2016-07-27 DIAGNOSIS — D61818 Other pancytopenia: Secondary | ICD-10-CM | POA: Diagnosis not present

## 2016-07-27 DIAGNOSIS — N183 Chronic kidney disease, stage 3 (moderate): Secondary | ICD-10-CM | POA: Diagnosis not present

## 2016-07-27 DIAGNOSIS — I10 Essential (primary) hypertension: Secondary | ICD-10-CM | POA: Diagnosis not present

## 2016-07-27 MED ORDER — METOPROLOL TARTRATE 25 MG PO TABS: 25.0000 mg | ORAL_TABLET | Freq: Two times a day (BID) | ORAL | 0 refills | Status: DC

## 2016-07-27 MED ORDER — AMLODIPINE BESYLATE 10 MG PO TABS: 10.0000 mg | ORAL_TABLET | Freq: Every day | ORAL | 1 refills | Status: DC

## 2016-07-27 NOTE — Telephone Encounter (Signed)
Received call from [place], [place] @ [place] requesting refills of Norvasc and Metoprolol.   Vicky also requested clarification on Ativan.   Per pt, he takes  Ativan 1 mg  BID and  2 mg at HS.  From medication list, pt to take  Ativan 1 mg BID only.   Dr. Benay Spice notified.   OK for refills of BP meds ( original scripts from PCP ).  Per Dr. Benay Spice, pt to take  Ativan 1 mg  BID only.   Spoke with Olegario Shearer and informed her of Dr. Gearldine Shown instructions.  Vicky voiced understanding and stated she would contact the son for Ativan correct dose. Vicky's    Phone      [phone].

## 2016-08-02 ENCOUNTER — Other Ambulatory Visit: Payer: Self-pay | Admitting: Nurse Practitioner

## 2016-08-02 DIAGNOSIS — I10 Essential (primary) hypertension: Secondary | ICD-10-CM | POA: Diagnosis not present

## 2016-08-02 DIAGNOSIS — D469 Myelodysplastic syndrome, unspecified: Secondary | ICD-10-CM | POA: Diagnosis not present

## 2016-08-02 DIAGNOSIS — N183 Chronic kidney disease, stage 3 (moderate): Secondary | ICD-10-CM | POA: Diagnosis not present

## 2016-08-02 DIAGNOSIS — C189 Malignant neoplasm of colon, unspecified: Secondary | ICD-10-CM | POA: Diagnosis not present

## 2016-08-02 DIAGNOSIS — D61818 Other pancytopenia: Secondary | ICD-10-CM | POA: Diagnosis not present

## 2016-08-02 DIAGNOSIS — F411 Generalized anxiety disorder: Secondary | ICD-10-CM | POA: Diagnosis not present

## 2016-08-03 ENCOUNTER — Other Ambulatory Visit: Payer: Self-pay | Admitting: Oncology

## 2016-08-03 ENCOUNTER — Other Ambulatory Visit: Payer: Self-pay | Admitting: *Deleted

## 2016-08-03 ENCOUNTER — Telehealth: Payer: Self-pay

## 2016-08-03 ENCOUNTER — Other Ambulatory Visit: Payer: Self-pay | Admitting: Nurse Practitioner

## 2016-08-03 DIAGNOSIS — D469 Myelodysplastic syndrome, unspecified: Secondary | ICD-10-CM

## 2016-08-03 DIAGNOSIS — N183 Chronic kidney disease, stage 3 (moderate): Secondary | ICD-10-CM | POA: Diagnosis not present

## 2016-08-03 DIAGNOSIS — I10 Essential (primary) hypertension: Secondary | ICD-10-CM | POA: Diagnosis not present

## 2016-08-03 DIAGNOSIS — F411 Generalized anxiety disorder: Secondary | ICD-10-CM | POA: Diagnosis not present

## 2016-08-03 DIAGNOSIS — C189 Malignant neoplasm of colon, unspecified: Secondary | ICD-10-CM | POA: Diagnosis not present

## 2016-08-03 DIAGNOSIS — D61818 Other pancytopenia: Secondary | ICD-10-CM | POA: Diagnosis not present

## 2016-08-03 NOTE — Telephone Encounter (Signed)
Message from hospice nurse requesting refill on Tramadol. Noted med was refilled today. Left message confirming fax # and informing hospice RN that the best way to reach [place] is by phone.

## 2016-08-03 NOTE — Telephone Encounter (Signed)
Called in tramadol rx to [place]

## 2016-08-04 ENCOUNTER — Telehealth: Payer: Self-pay | Admitting: *Deleted

## 2016-08-04 DIAGNOSIS — N183 Chronic kidney disease, stage 3 (moderate): Secondary | ICD-10-CM | POA: Diagnosis not present

## 2016-08-04 DIAGNOSIS — C189 Malignant neoplasm of colon, unspecified: Secondary | ICD-10-CM | POA: Diagnosis not present

## 2016-08-04 DIAGNOSIS — D61818 Other pancytopenia: Secondary | ICD-10-CM | POA: Diagnosis not present

## 2016-08-04 DIAGNOSIS — F411 Generalized anxiety disorder: Secondary | ICD-10-CM | POA: Diagnosis not present

## 2016-08-04 DIAGNOSIS — D469 Myelodysplastic syndrome, unspecified: Secondary | ICD-10-CM | POA: Diagnosis not present

## 2016-08-04 DIAGNOSIS — I10 Essential (primary) hypertension: Secondary | ICD-10-CM | POA: Diagnosis not present

## 2016-08-04 MED ORDER — TRAZODONE HCL 300 MG PO TABS: 300.0000 mg | ORAL_TABLET | Freq: Every day | ORAL | 0 refills | Status: DC

## 2016-08-04 NOTE — Telephone Encounter (Signed)
Received fax request to refill Trazodone. Dr. Benay Spice signed fax request and faxed back to [place].

## 2016-08-09 DIAGNOSIS — D469 Myelodysplastic syndrome, unspecified: Secondary | ICD-10-CM | POA: Diagnosis not present

## 2016-08-09 DIAGNOSIS — C189 Malignant neoplasm of colon, unspecified: Secondary | ICD-10-CM | POA: Diagnosis not present

## 2016-08-09 DIAGNOSIS — N183 Chronic kidney disease, stage 3 (moderate): Secondary | ICD-10-CM | POA: Diagnosis not present

## 2016-08-09 DIAGNOSIS — F411 Generalized anxiety disorder: Secondary | ICD-10-CM | POA: Diagnosis not present

## 2016-08-09 DIAGNOSIS — I10 Essential (primary) hypertension: Secondary | ICD-10-CM | POA: Diagnosis not present

## 2016-08-09 DIAGNOSIS — D61818 Other pancytopenia: Secondary | ICD-10-CM | POA: Diagnosis not present

## 2016-08-10 DIAGNOSIS — F411 Generalized anxiety disorder: Secondary | ICD-10-CM | POA: Diagnosis not present

## 2016-08-10 DIAGNOSIS — I10 Essential (primary) hypertension: Secondary | ICD-10-CM | POA: Diagnosis not present

## 2016-08-10 DIAGNOSIS — D469 Myelodysplastic syndrome, unspecified: Secondary | ICD-10-CM | POA: Diagnosis not present

## 2016-08-10 DIAGNOSIS — C189 Malignant neoplasm of colon, unspecified: Secondary | ICD-10-CM | POA: Diagnosis not present

## 2016-08-10 DIAGNOSIS — D61818 Other pancytopenia: Secondary | ICD-10-CM | POA: Diagnosis not present

## 2016-08-10 DIAGNOSIS — N183 Chronic kidney disease, stage 3 (moderate): Secondary | ICD-10-CM | POA: Diagnosis not present

## 2016-08-16 ENCOUNTER — Telehealth: Payer: Self-pay | Admitting: Oncology

## 2016-08-16 ENCOUNTER — Telehealth: Payer: Self-pay | Admitting: *Deleted

## 2016-08-16 DIAGNOSIS — I10 Essential (primary) hypertension: Secondary | ICD-10-CM | POA: Diagnosis not present

## 2016-08-16 DIAGNOSIS — D61818 Other pancytopenia: Secondary | ICD-10-CM | POA: Diagnosis not present

## 2016-08-16 DIAGNOSIS — F411 Generalized anxiety disorder: Secondary | ICD-10-CM | POA: Diagnosis not present

## 2016-08-16 DIAGNOSIS — D469 Myelodysplastic syndrome, unspecified: Secondary | ICD-10-CM | POA: Diagnosis not present

## 2016-08-16 DIAGNOSIS — C189 Malignant neoplasm of colon, unspecified: Secondary | ICD-10-CM | POA: Diagnosis not present

## 2016-08-16 DIAGNOSIS — N183 Chronic kidney disease, stage 3 (moderate): Secondary | ICD-10-CM | POA: Diagnosis not present

## 2016-08-16 NOTE — Telephone Encounter (Signed)
Dr Benay Spice sick - moved 12/28 lab/fu to 1/26. Spoke with patient and per patient called his [name] with new date/time. Left message for [name] - number listed in EPIC. Schedule mailed.

## 2016-08-16 NOTE — Telephone Encounter (Signed)
Notified pt's son that office visit 12/28 will be rescheduled. He asks that we call his cell number with new appt. Avoid Fridays. Will notify schedulers.

## 2016-08-17 ENCOUNTER — Other Ambulatory Visit: Payer: Medicare Other

## 2016-08-17 ENCOUNTER — Ambulatory Visit: Payer: Medicare Other | Admitting: Oncology

## 2016-08-17 DIAGNOSIS — I10 Essential (primary) hypertension: Secondary | ICD-10-CM | POA: Diagnosis not present

## 2016-08-17 DIAGNOSIS — N183 Chronic kidney disease, stage 3 (moderate): Secondary | ICD-10-CM | POA: Diagnosis not present

## 2016-08-17 DIAGNOSIS — D469 Myelodysplastic syndrome, unspecified: Secondary | ICD-10-CM | POA: Diagnosis not present

## 2016-08-17 DIAGNOSIS — D61818 Other pancytopenia: Secondary | ICD-10-CM | POA: Diagnosis not present

## 2016-08-17 DIAGNOSIS — C189 Malignant neoplasm of colon, unspecified: Secondary | ICD-10-CM | POA: Diagnosis not present

## 2016-08-17 DIAGNOSIS — F411 Generalized anxiety disorder: Secondary | ICD-10-CM | POA: Diagnosis not present

## 2016-08-21 DIAGNOSIS — D469 Myelodysplastic syndrome, unspecified: Secondary | ICD-10-CM | POA: Diagnosis not present

## 2016-08-23 DIAGNOSIS — D469 Myelodysplastic syndrome, unspecified: Secondary | ICD-10-CM | POA: Diagnosis not present

## 2016-08-24 DIAGNOSIS — D469 Myelodysplastic syndrome, unspecified: Secondary | ICD-10-CM | POA: Diagnosis not present

## 2016-08-29 ENCOUNTER — Ambulatory Visit (HOSPITAL_BASED_OUTPATIENT_CLINIC_OR_DEPARTMENT_OTHER): Payer: Medicare Other | Admitting: Oncology

## 2016-08-29 ENCOUNTER — Other Ambulatory Visit: Payer: Self-pay | Admitting: Nurse Practitioner

## 2016-08-29 ENCOUNTER — Telehealth: Payer: Self-pay | Admitting: Oncology

## 2016-08-29 ENCOUNTER — Other Ambulatory Visit (HOSPITAL_BASED_OUTPATIENT_CLINIC_OR_DEPARTMENT_OTHER): Payer: Medicare Other

## 2016-08-29 ENCOUNTER — Other Ambulatory Visit: Payer: Self-pay | Admitting: Oncology

## 2016-08-29 VITALS — BP 118/62 | HR 53 | Temp 98.4°F | Resp 18 | Wt 171.4 lb

## 2016-08-29 DIAGNOSIS — D469 Myelodysplastic syndrome, unspecified: Secondary | ICD-10-CM

## 2016-08-29 DIAGNOSIS — Z85038 Personal history of other malignant neoplasm of large intestine: Secondary | ICD-10-CM | POA: Diagnosis not present

## 2016-08-29 DIAGNOSIS — IMO0001 Reserved for inherently not codable concepts without codable children: Secondary | ICD-10-CM

## 2016-08-29 LAB — CBC WITH DIFFERENTIAL/PLATELET
BASO%: 0 % (ref 0.0–2.0)
Basophils Absolute: 0 10*3/uL (ref 0.0–0.1)
EOS ABS: 0 10*3/uL (ref 0.0–0.5)
EOS%: 0.3 % (ref 0.0–7.0)
HEMATOCRIT: 31.9 % — AB (ref 38.4–49.9)
HEMOGLOBIN: 10.7 g/dL — AB (ref 13.0–17.1)
LYMPH#: 0.8 10*3/uL — AB (ref 0.9–3.3)
LYMPH%: 23 % (ref 14.0–49.0)
MCH: 29.4 pg (ref 27.2–33.4)
MCHC: 33.5 g/dL (ref 32.0–36.0)
MCV: 87.6 fL (ref 79.3–98.0)
MONO#: 1 10*3/uL — AB (ref 0.1–0.9)
MONO%: 30.2 % — ABNORMAL HIGH (ref 0.0–14.0)
NEUT#: 1.6 10*3/uL (ref 1.5–6.5)
NEUT%: 46.5 % (ref 39.0–75.0)
PLATELETS: 65 10*3/uL — AB (ref 140–400)
RBC: 3.64 10*6/uL — ABNORMAL LOW (ref 4.20–5.82)
RDW: 14.9 % — AB (ref 11.0–14.6)
WBC: 3.4 10*3/uL — ABNORMAL LOW (ref 4.0–10.3)

## 2016-08-29 NOTE — Telephone Encounter (Signed)
Gave patient relative avs report and appointments for April.  °

## 2016-08-29 NOTE — Telephone Encounter (Signed)
Called in tramadol rx to [place]

## 2016-08-29 NOTE — Progress Notes (Signed)
  [place] OFFICE PROGRESS NOTE   Diagnosis: Myelodysplasia, history of colon cancer  INTERVAL HISTORY:   [name] returns as scheduled. He reports a good appetite, but he has a limited diet. He has intermittent bleeding from the right nostril. No other bleeding. He continues weekly follow-up with the home hospice RN. No recent fall.  Objective:  Vital signs in last 24 hours:  Blood pressure 118/62, pulse (!) 53, temperature 98.4 F (36.9 C), resp. rate 18, weight 171 lb 6.4 oz (77.7 kg), SpO2 100 %.    HEENT: No thrush or ulcers, mild erythema at the right nasal septum, no active bleeding Lymphatics: No cervical, supraclavicular, or axillary nodes Resp: Lungs-distant breath sounds, no respiratory distress Cardio: Regular rate and rhythm GI: No hepatosplenomegaly, no mass, nontender Vascular: No leg edema   Lab Results:  Lab Results  Component Value Date   WBC 3.4 (L) 08/29/2016   HGB 10.7 (L) 08/29/2016   HCT 31.9 (L) 08/29/2016   MCV 87.6 08/29/2016   PLT 65 (L) 08/29/2016   NEUTROABS 1.6 08/29/2016     Medications: I have reviewed the patient's current medications.  Assessment/Plan: 1. Stage III colon cancer diagnosed in August 2008, status post adjuvant Xeloda chemotherapy, completed in April 2009. He underwent a colonoscopy 11/16/2011 with multiple polyps. 2. History of increased tearing, status post right tear duct stent placement.  3. History of multiple colonic polyps, status post a negative colonoscopy by Dr. Benson Norway in May 2011.  4. Anxiety disorder.  5. Multiple back surgeries.  6. Hypertension.  7. Gastroesophageal reflux disease, status post a Nissen fundoplication.  8. Macular degeneration, followed by Dr. Zadie Rhine.  9. Right ear "tinnitus," and hearing loss followed by Dr. Ernesto Rutherford.  10. Severe microcytic anemia. Ferritin returned low at 7 on 11/13/2011. He was transfused 2 units of blood. Bone marrow biopsy on 11/28/2011  confirmed decreased iron stores. The hemoglobin normalized. He continues oral iron. 11. Hemoccult positive stool. He underwent an upper endoscopy on 11/16/2011 with findings of moderate gastritis and question atypical duodenal AVMs. There was no evidence of active bleeding. Colonoscopy also on 11/16/2011 showed multiple polyps, hemorrhoids and diverticula. 12. Thrombocytopenia. Stable. 13. Mild leukopenia. Stable. 14. Mildly elevated LDH 11/13/2011. 15. Mildly elevated PT 11/13/2011. 16. 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 There was no evidence of metastatic carcinoma. Storage iron was decreased. Cytogenetic returned with a normal 1 XY karyotype. A molecular FISH panel was negative. Restaging bone marrow 02/18/2016 showed hypercellular bone marrow for age with persistent myelodysplastic state. Blast cell count slightly higher at 16%. Storage iron present. Cytogenetic analysis was normal.     Disposition:  [name] is stable from a hematologic standpoint. He will return for an office visit and CBC in 3 months. He continues follow-up with the Home hospice program.  15 minutes were spent with the patient today. The majority of the time was used for counseling and coordination of care.  Betsy Coder, MD  08/29/2016  1:58 PM

## 2016-08-30 DIAGNOSIS — D469 Myelodysplastic syndrome, unspecified: Secondary | ICD-10-CM | POA: Diagnosis not present

## 2016-08-31 ENCOUNTER — Telehealth: Payer: Self-pay | Admitting: *Deleted

## 2016-08-31 DIAGNOSIS — D469 Myelodysplastic syndrome, unspecified: Secondary | ICD-10-CM | POA: Diagnosis not present

## 2016-08-31 NOTE — Telephone Encounter (Signed)
Call placed to [facility] with [facility] to notify her per order of Dr. Benay Spice to d/c flonase, d/c home lab draws and to decrease metoprolol to once a day.  Instructed Jocelyn Lamer to call [place] back with any further questions.

## 2016-08-31 NOTE — Telephone Encounter (Signed)
Message received from [place] with [facility] to see if Dr. Benay Spice would like to d/c lab draws for pt at home, and also if he would like to d/c flonase and decrease metoprolol to once a day.

## 2016-09-08 DIAGNOSIS — D469 Myelodysplastic syndrome, unspecified: Secondary | ICD-10-CM | POA: Diagnosis not present

## 2016-09-13 DIAGNOSIS — D469 Myelodysplastic syndrome, unspecified: Secondary | ICD-10-CM | POA: Diagnosis not present

## 2016-09-14 DIAGNOSIS — D469 Myelodysplastic syndrome, unspecified: Secondary | ICD-10-CM | POA: Diagnosis not present

## 2016-09-15 ENCOUNTER — Ambulatory Visit: Payer: Medicare Other | Admitting: Oncology

## 2016-09-15 ENCOUNTER — Other Ambulatory Visit: Payer: Medicare Other

## 2016-09-20 DIAGNOSIS — D469 Myelodysplastic syndrome, unspecified: Secondary | ICD-10-CM | POA: Diagnosis not present

## 2016-09-21 DIAGNOSIS — K219 Gastro-esophageal reflux disease without esophagitis: Secondary | ICD-10-CM | POA: Diagnosis not present

## 2016-09-21 DIAGNOSIS — N183 Chronic kidney disease, stage 3 (moderate): Secondary | ICD-10-CM | POA: Diagnosis not present

## 2016-09-21 DIAGNOSIS — F411 Generalized anxiety disorder: Secondary | ICD-10-CM | POA: Diagnosis not present

## 2016-09-21 DIAGNOSIS — I10 Essential (primary) hypertension: Secondary | ICD-10-CM | POA: Diagnosis not present

## 2016-09-21 DIAGNOSIS — J309 Allergic rhinitis, unspecified: Secondary | ICD-10-CM | POA: Diagnosis not present

## 2016-09-21 DIAGNOSIS — D61818 Other pancytopenia: Secondary | ICD-10-CM | POA: Diagnosis not present

## 2016-09-21 DIAGNOSIS — L209 Atopic dermatitis, unspecified: Secondary | ICD-10-CM | POA: Diagnosis not present

## 2016-09-21 DIAGNOSIS — H3530 Unspecified macular degeneration: Secondary | ICD-10-CM | POA: Diagnosis not present

## 2016-09-21 DIAGNOSIS — H9319 Tinnitus, unspecified ear: Secondary | ICD-10-CM | POA: Diagnosis not present

## 2016-09-21 DIAGNOSIS — C189 Malignant neoplasm of colon, unspecified: Secondary | ICD-10-CM | POA: Diagnosis not present

## 2016-09-21 DIAGNOSIS — D469 Myelodysplastic syndrome, unspecified: Secondary | ICD-10-CM | POA: Diagnosis not present

## 2016-09-26 DIAGNOSIS — D61818 Other pancytopenia: Secondary | ICD-10-CM | POA: Diagnosis not present

## 2016-09-26 DIAGNOSIS — D469 Myelodysplastic syndrome, unspecified: Secondary | ICD-10-CM | POA: Diagnosis not present

## 2016-09-26 DIAGNOSIS — C189 Malignant neoplasm of colon, unspecified: Secondary | ICD-10-CM | POA: Diagnosis not present

## 2016-09-26 DIAGNOSIS — F411 Generalized anxiety disorder: Secondary | ICD-10-CM | POA: Diagnosis not present

## 2016-09-26 DIAGNOSIS — N183 Chronic kidney disease, stage 3 (moderate): Secondary | ICD-10-CM | POA: Diagnosis not present

## 2016-09-26 DIAGNOSIS — I10 Essential (primary) hypertension: Secondary | ICD-10-CM | POA: Diagnosis not present

## 2016-09-27 DIAGNOSIS — N183 Chronic kidney disease, stage 3 (moderate): Secondary | ICD-10-CM | POA: Diagnosis not present

## 2016-09-27 DIAGNOSIS — D61818 Other pancytopenia: Secondary | ICD-10-CM | POA: Diagnosis not present

## 2016-09-27 DIAGNOSIS — I10 Essential (primary) hypertension: Secondary | ICD-10-CM | POA: Diagnosis not present

## 2016-09-27 DIAGNOSIS — C189 Malignant neoplasm of colon, unspecified: Secondary | ICD-10-CM | POA: Diagnosis not present

## 2016-09-27 DIAGNOSIS — D469 Myelodysplastic syndrome, unspecified: Secondary | ICD-10-CM | POA: Diagnosis not present

## 2016-09-27 DIAGNOSIS — F411 Generalized anxiety disorder: Secondary | ICD-10-CM | POA: Diagnosis not present

## 2016-09-28 DIAGNOSIS — I10 Essential (primary) hypertension: Secondary | ICD-10-CM | POA: Diagnosis not present

## 2016-09-28 DIAGNOSIS — F411 Generalized anxiety disorder: Secondary | ICD-10-CM | POA: Diagnosis not present

## 2016-09-28 DIAGNOSIS — C189 Malignant neoplasm of colon, unspecified: Secondary | ICD-10-CM | POA: Diagnosis not present

## 2016-09-28 DIAGNOSIS — D469 Myelodysplastic syndrome, unspecified: Secondary | ICD-10-CM | POA: Diagnosis not present

## 2016-09-28 DIAGNOSIS — D61818 Other pancytopenia: Secondary | ICD-10-CM | POA: Diagnosis not present

## 2016-09-28 DIAGNOSIS — N183 Chronic kidney disease, stage 3 (moderate): Secondary | ICD-10-CM | POA: Diagnosis not present

## 2016-10-02 ENCOUNTER — Other Ambulatory Visit: Payer: Self-pay | Admitting: Oncology

## 2016-10-02 DIAGNOSIS — I10 Essential (primary) hypertension: Secondary | ICD-10-CM

## 2016-10-03 ENCOUNTER — Other Ambulatory Visit: Payer: Self-pay | Admitting: Oncology

## 2016-10-03 ENCOUNTER — Other Ambulatory Visit: Payer: Self-pay | Admitting: *Deleted

## 2016-10-03 ENCOUNTER — Telehealth: Payer: Self-pay

## 2016-10-03 DIAGNOSIS — D469 Myelodysplastic syndrome, unspecified: Secondary | ICD-10-CM | POA: Diagnosis not present

## 2016-10-03 DIAGNOSIS — I10 Essential (primary) hypertension: Secondary | ICD-10-CM

## 2016-10-03 DIAGNOSIS — N183 Chronic kidney disease, stage 3 (moderate): Secondary | ICD-10-CM | POA: Diagnosis not present

## 2016-10-03 DIAGNOSIS — F411 Generalized anxiety disorder: Secondary | ICD-10-CM | POA: Diagnosis not present

## 2016-10-03 DIAGNOSIS — C189 Malignant neoplasm of colon, unspecified: Secondary | ICD-10-CM | POA: Diagnosis not present

## 2016-10-03 DIAGNOSIS — D61818 Other pancytopenia: Secondary | ICD-10-CM | POA: Diagnosis not present

## 2016-10-03 MED ORDER — LORAZEPAM 2 MG PO TABS: ORAL_TABLET | ORAL | 0 refills | Status: DC

## 2016-10-03 MED ORDER — METOPROLOL SUCCINATE ER 25 MG PO TB24: 25.0000 mg | ORAL_TABLET | Freq: Every day | ORAL | 1 refills | Status: DC

## 2016-10-03 NOTE — Telephone Encounter (Signed)
[place] RN called.  [phone]  Pt is taking metoprolol 25 mg daily. The rx is stating bid. Changed on 1/12 to daily. Pharmacy is asking if Dr Benay Spice wants to rx extended release rather than tartrate.  He needs refill today b/c he took last one this am.  Also needs tramadol, lorazepam and trazadone per a fax we should be receiving from [facility]  Also please give tylenol instructions currently it stated daily prn.

## 2016-10-04 DIAGNOSIS — I10 Essential (primary) hypertension: Secondary | ICD-10-CM | POA: Diagnosis not present

## 2016-10-04 DIAGNOSIS — D469 Myelodysplastic syndrome, unspecified: Secondary | ICD-10-CM | POA: Diagnosis not present

## 2016-10-04 DIAGNOSIS — N183 Chronic kidney disease, stage 3 (moderate): Secondary | ICD-10-CM | POA: Diagnosis not present

## 2016-10-04 DIAGNOSIS — F411 Generalized anxiety disorder: Secondary | ICD-10-CM | POA: Diagnosis not present

## 2016-10-04 DIAGNOSIS — C189 Malignant neoplasm of colon, unspecified: Secondary | ICD-10-CM | POA: Diagnosis not present

## 2016-10-04 DIAGNOSIS — D61818 Other pancytopenia: Secondary | ICD-10-CM | POA: Diagnosis not present

## 2016-10-09 DIAGNOSIS — D469 Myelodysplastic syndrome, unspecified: Secondary | ICD-10-CM | POA: Diagnosis not present

## 2016-10-09 DIAGNOSIS — C189 Malignant neoplasm of colon, unspecified: Secondary | ICD-10-CM | POA: Diagnosis not present

## 2016-10-09 DIAGNOSIS — I10 Essential (primary) hypertension: Secondary | ICD-10-CM | POA: Diagnosis not present

## 2016-10-09 DIAGNOSIS — N183 Chronic kidney disease, stage 3 (moderate): Secondary | ICD-10-CM | POA: Diagnosis not present

## 2016-10-09 DIAGNOSIS — F411 Generalized anxiety disorder: Secondary | ICD-10-CM | POA: Diagnosis not present

## 2016-10-09 DIAGNOSIS — D61818 Other pancytopenia: Secondary | ICD-10-CM | POA: Diagnosis not present

## 2016-10-11 DIAGNOSIS — C189 Malignant neoplasm of colon, unspecified: Secondary | ICD-10-CM | POA: Diagnosis not present

## 2016-10-11 DIAGNOSIS — N183 Chronic kidney disease, stage 3 (moderate): Secondary | ICD-10-CM | POA: Diagnosis not present

## 2016-10-11 DIAGNOSIS — F411 Generalized anxiety disorder: Secondary | ICD-10-CM | POA: Diagnosis not present

## 2016-10-11 DIAGNOSIS — D469 Myelodysplastic syndrome, unspecified: Secondary | ICD-10-CM | POA: Diagnosis not present

## 2016-10-11 DIAGNOSIS — I10 Essential (primary) hypertension: Secondary | ICD-10-CM | POA: Diagnosis not present

## 2016-10-11 DIAGNOSIS — D61818 Other pancytopenia: Secondary | ICD-10-CM | POA: Diagnosis not present

## 2016-10-12 DIAGNOSIS — F411 Generalized anxiety disorder: Secondary | ICD-10-CM | POA: Diagnosis not present

## 2016-10-12 DIAGNOSIS — D61818 Other pancytopenia: Secondary | ICD-10-CM | POA: Diagnosis not present

## 2016-10-12 DIAGNOSIS — N183 Chronic kidney disease, stage 3 (moderate): Secondary | ICD-10-CM | POA: Diagnosis not present

## 2016-10-12 DIAGNOSIS — D469 Myelodysplastic syndrome, unspecified: Secondary | ICD-10-CM | POA: Diagnosis not present

## 2016-10-12 DIAGNOSIS — C189 Malignant neoplasm of colon, unspecified: Secondary | ICD-10-CM | POA: Diagnosis not present

## 2016-10-12 DIAGNOSIS — I10 Essential (primary) hypertension: Secondary | ICD-10-CM | POA: Diagnosis not present

## 2016-10-18 DIAGNOSIS — D61818 Other pancytopenia: Secondary | ICD-10-CM | POA: Diagnosis not present

## 2016-10-18 DIAGNOSIS — N183 Chronic kidney disease, stage 3 (moderate): Secondary | ICD-10-CM | POA: Diagnosis not present

## 2016-10-18 DIAGNOSIS — I10 Essential (primary) hypertension: Secondary | ICD-10-CM | POA: Diagnosis not present

## 2016-10-18 DIAGNOSIS — F411 Generalized anxiety disorder: Secondary | ICD-10-CM | POA: Diagnosis not present

## 2016-10-18 DIAGNOSIS — C189 Malignant neoplasm of colon, unspecified: Secondary | ICD-10-CM | POA: Diagnosis not present

## 2016-10-18 DIAGNOSIS — D469 Myelodysplastic syndrome, unspecified: Secondary | ICD-10-CM | POA: Diagnosis not present

## 2016-10-19 DIAGNOSIS — K219 Gastro-esophageal reflux disease without esophagitis: Secondary | ICD-10-CM | POA: Diagnosis not present

## 2016-10-19 DIAGNOSIS — J309 Allergic rhinitis, unspecified: Secondary | ICD-10-CM | POA: Diagnosis not present

## 2016-10-19 DIAGNOSIS — H3530 Unspecified macular degeneration: Secondary | ICD-10-CM | POA: Diagnosis not present

## 2016-10-19 DIAGNOSIS — I10 Essential (primary) hypertension: Secondary | ICD-10-CM | POA: Diagnosis not present

## 2016-10-19 DIAGNOSIS — H9319 Tinnitus, unspecified ear: Secondary | ICD-10-CM | POA: Diagnosis not present

## 2016-10-19 DIAGNOSIS — D469 Myelodysplastic syndrome, unspecified: Secondary | ICD-10-CM | POA: Diagnosis not present

## 2016-10-19 DIAGNOSIS — C189 Malignant neoplasm of colon, unspecified: Secondary | ICD-10-CM | POA: Diagnosis not present

## 2016-10-19 DIAGNOSIS — D61818 Other pancytopenia: Secondary | ICD-10-CM | POA: Diagnosis not present

## 2016-10-19 DIAGNOSIS — L209 Atopic dermatitis, unspecified: Secondary | ICD-10-CM | POA: Diagnosis not present

## 2016-10-19 DIAGNOSIS — N183 Chronic kidney disease, stage 3 (moderate): Secondary | ICD-10-CM | POA: Diagnosis not present

## 2016-10-19 DIAGNOSIS — F411 Generalized anxiety disorder: Secondary | ICD-10-CM | POA: Diagnosis not present

## 2016-10-23 DIAGNOSIS — F411 Generalized anxiety disorder: Secondary | ICD-10-CM | POA: Diagnosis not present

## 2016-10-23 DIAGNOSIS — D469 Myelodysplastic syndrome, unspecified: Secondary | ICD-10-CM | POA: Diagnosis not present

## 2016-10-23 DIAGNOSIS — C189 Malignant neoplasm of colon, unspecified: Secondary | ICD-10-CM | POA: Diagnosis not present

## 2016-10-23 DIAGNOSIS — D61818 Other pancytopenia: Secondary | ICD-10-CM | POA: Diagnosis not present

## 2016-10-23 DIAGNOSIS — N183 Chronic kidney disease, stage 3 (moderate): Secondary | ICD-10-CM | POA: Diagnosis not present

## 2016-10-23 DIAGNOSIS — I10 Essential (primary) hypertension: Secondary | ICD-10-CM | POA: Diagnosis not present

## 2016-10-25 DIAGNOSIS — D61818 Other pancytopenia: Secondary | ICD-10-CM | POA: Diagnosis not present

## 2016-10-25 DIAGNOSIS — I10 Essential (primary) hypertension: Secondary | ICD-10-CM | POA: Diagnosis not present

## 2016-10-25 DIAGNOSIS — F411 Generalized anxiety disorder: Secondary | ICD-10-CM | POA: Diagnosis not present

## 2016-10-25 DIAGNOSIS — C189 Malignant neoplasm of colon, unspecified: Secondary | ICD-10-CM | POA: Diagnosis not present

## 2016-10-25 DIAGNOSIS — N183 Chronic kidney disease, stage 3 (moderate): Secondary | ICD-10-CM | POA: Diagnosis not present

## 2016-10-25 DIAGNOSIS — D469 Myelodysplastic syndrome, unspecified: Secondary | ICD-10-CM | POA: Diagnosis not present

## 2016-10-26 ENCOUNTER — Other Ambulatory Visit: Payer: Self-pay | Admitting: Oncology

## 2016-10-26 DIAGNOSIS — N183 Chronic kidney disease, stage 3 (moderate): Secondary | ICD-10-CM | POA: Diagnosis not present

## 2016-10-26 DIAGNOSIS — C189 Malignant neoplasm of colon, unspecified: Secondary | ICD-10-CM | POA: Diagnosis not present

## 2016-10-26 DIAGNOSIS — D469 Myelodysplastic syndrome, unspecified: Secondary | ICD-10-CM | POA: Diagnosis not present

## 2016-10-26 DIAGNOSIS — F411 Generalized anxiety disorder: Secondary | ICD-10-CM | POA: Diagnosis not present

## 2016-10-26 DIAGNOSIS — D61818 Other pancytopenia: Secondary | ICD-10-CM | POA: Diagnosis not present

## 2016-10-26 DIAGNOSIS — I10 Essential (primary) hypertension: Secondary | ICD-10-CM | POA: Diagnosis not present

## 2016-10-26 NOTE — Telephone Encounter (Signed)
"  This is IT sales professional with Hospice calling to get the best fax number for pharmacies to send refill order requests.  I also need to ask for refills that include additional refills for the lorazepam, tramadol, and trazodone.  This will save me and the office time when these refills are needed."

## 2016-11-01 DIAGNOSIS — D469 Myelodysplastic syndrome, unspecified: Secondary | ICD-10-CM | POA: Diagnosis not present

## 2016-11-01 DIAGNOSIS — D61818 Other pancytopenia: Secondary | ICD-10-CM | POA: Diagnosis not present

## 2016-11-01 DIAGNOSIS — F411 Generalized anxiety disorder: Secondary | ICD-10-CM | POA: Diagnosis not present

## 2016-11-01 DIAGNOSIS — C189 Malignant neoplasm of colon, unspecified: Secondary | ICD-10-CM | POA: Diagnosis not present

## 2016-11-01 DIAGNOSIS — I10 Essential (primary) hypertension: Secondary | ICD-10-CM | POA: Diagnosis not present

## 2016-11-01 DIAGNOSIS — N183 Chronic kidney disease, stage 3 (moderate): Secondary | ICD-10-CM | POA: Diagnosis not present

## 2016-11-02 DIAGNOSIS — F411 Generalized anxiety disorder: Secondary | ICD-10-CM | POA: Diagnosis not present

## 2016-11-02 DIAGNOSIS — I10 Essential (primary) hypertension: Secondary | ICD-10-CM | POA: Diagnosis not present

## 2016-11-02 DIAGNOSIS — N183 Chronic kidney disease, stage 3 (moderate): Secondary | ICD-10-CM | POA: Diagnosis not present

## 2016-11-02 DIAGNOSIS — D61818 Other pancytopenia: Secondary | ICD-10-CM | POA: Diagnosis not present

## 2016-11-02 DIAGNOSIS — C189 Malignant neoplasm of colon, unspecified: Secondary | ICD-10-CM | POA: Diagnosis not present

## 2016-11-02 DIAGNOSIS — D469 Myelodysplastic syndrome, unspecified: Secondary | ICD-10-CM | POA: Diagnosis not present

## 2016-11-08 DIAGNOSIS — C189 Malignant neoplasm of colon, unspecified: Secondary | ICD-10-CM | POA: Diagnosis not present

## 2016-11-08 DIAGNOSIS — I10 Essential (primary) hypertension: Secondary | ICD-10-CM | POA: Diagnosis not present

## 2016-11-08 DIAGNOSIS — D469 Myelodysplastic syndrome, unspecified: Secondary | ICD-10-CM | POA: Diagnosis not present

## 2016-11-08 DIAGNOSIS — F411 Generalized anxiety disorder: Secondary | ICD-10-CM | POA: Diagnosis not present

## 2016-11-08 DIAGNOSIS — N183 Chronic kidney disease, stage 3 (moderate): Secondary | ICD-10-CM | POA: Diagnosis not present

## 2016-11-08 DIAGNOSIS — D61818 Other pancytopenia: Secondary | ICD-10-CM | POA: Diagnosis not present

## 2016-11-09 DIAGNOSIS — F411 Generalized anxiety disorder: Secondary | ICD-10-CM | POA: Diagnosis not present

## 2016-11-09 DIAGNOSIS — D61818 Other pancytopenia: Secondary | ICD-10-CM | POA: Diagnosis not present

## 2016-11-09 DIAGNOSIS — D469 Myelodysplastic syndrome, unspecified: Secondary | ICD-10-CM | POA: Diagnosis not present

## 2016-11-09 DIAGNOSIS — N183 Chronic kidney disease, stage 3 (moderate): Secondary | ICD-10-CM | POA: Diagnosis not present

## 2016-11-09 DIAGNOSIS — I10 Essential (primary) hypertension: Secondary | ICD-10-CM | POA: Diagnosis not present

## 2016-11-09 DIAGNOSIS — C189 Malignant neoplasm of colon, unspecified: Secondary | ICD-10-CM | POA: Diagnosis not present

## 2016-11-15 DIAGNOSIS — F411 Generalized anxiety disorder: Secondary | ICD-10-CM | POA: Diagnosis not present

## 2016-11-15 DIAGNOSIS — I10 Essential (primary) hypertension: Secondary | ICD-10-CM | POA: Diagnosis not present

## 2016-11-15 DIAGNOSIS — D469 Myelodysplastic syndrome, unspecified: Secondary | ICD-10-CM | POA: Diagnosis not present

## 2016-11-15 DIAGNOSIS — D61818 Other pancytopenia: Secondary | ICD-10-CM | POA: Diagnosis not present

## 2016-11-15 DIAGNOSIS — C189 Malignant neoplasm of colon, unspecified: Secondary | ICD-10-CM | POA: Diagnosis not present

## 2016-11-15 DIAGNOSIS — N183 Chronic kidney disease, stage 3 (moderate): Secondary | ICD-10-CM | POA: Diagnosis not present

## 2016-11-16 DIAGNOSIS — F411 Generalized anxiety disorder: Secondary | ICD-10-CM | POA: Diagnosis not present

## 2016-11-16 DIAGNOSIS — I10 Essential (primary) hypertension: Secondary | ICD-10-CM | POA: Diagnosis not present

## 2016-11-16 DIAGNOSIS — D469 Myelodysplastic syndrome, unspecified: Secondary | ICD-10-CM | POA: Diagnosis not present

## 2016-11-16 DIAGNOSIS — D61818 Other pancytopenia: Secondary | ICD-10-CM | POA: Diagnosis not present

## 2016-11-16 DIAGNOSIS — N183 Chronic kidney disease, stage 3 (moderate): Secondary | ICD-10-CM | POA: Diagnosis not present

## 2016-11-16 DIAGNOSIS — C189 Malignant neoplasm of colon, unspecified: Secondary | ICD-10-CM | POA: Diagnosis not present

## 2016-11-19 DIAGNOSIS — N183 Chronic kidney disease, stage 3 (moderate): Secondary | ICD-10-CM | POA: Diagnosis not present

## 2016-11-19 DIAGNOSIS — K219 Gastro-esophageal reflux disease without esophagitis: Secondary | ICD-10-CM | POA: Diagnosis not present

## 2016-11-19 DIAGNOSIS — D61818 Other pancytopenia: Secondary | ICD-10-CM | POA: Diagnosis not present

## 2016-11-19 DIAGNOSIS — C189 Malignant neoplasm of colon, unspecified: Secondary | ICD-10-CM | POA: Diagnosis not present

## 2016-11-19 DIAGNOSIS — H9319 Tinnitus, unspecified ear: Secondary | ICD-10-CM | POA: Diagnosis not present

## 2016-11-19 DIAGNOSIS — J309 Allergic rhinitis, unspecified: Secondary | ICD-10-CM | POA: Diagnosis not present

## 2016-11-19 DIAGNOSIS — L209 Atopic dermatitis, unspecified: Secondary | ICD-10-CM | POA: Diagnosis not present

## 2016-11-19 DIAGNOSIS — I10 Essential (primary) hypertension: Secondary | ICD-10-CM | POA: Diagnosis not present

## 2016-11-19 DIAGNOSIS — H3530 Unspecified macular degeneration: Secondary | ICD-10-CM | POA: Diagnosis not present

## 2016-11-19 DIAGNOSIS — F411 Generalized anxiety disorder: Secondary | ICD-10-CM | POA: Diagnosis not present

## 2016-11-19 DIAGNOSIS — D469 Myelodysplastic syndrome, unspecified: Secondary | ICD-10-CM | POA: Diagnosis not present

## 2016-11-22 DIAGNOSIS — I10 Essential (primary) hypertension: Secondary | ICD-10-CM | POA: Diagnosis not present

## 2016-11-22 DIAGNOSIS — D469 Myelodysplastic syndrome, unspecified: Secondary | ICD-10-CM | POA: Diagnosis not present

## 2016-11-22 DIAGNOSIS — C189 Malignant neoplasm of colon, unspecified: Secondary | ICD-10-CM | POA: Diagnosis not present

## 2016-11-22 DIAGNOSIS — F411 Generalized anxiety disorder: Secondary | ICD-10-CM | POA: Diagnosis not present

## 2016-11-22 DIAGNOSIS — D61818 Other pancytopenia: Secondary | ICD-10-CM | POA: Diagnosis not present

## 2016-11-22 DIAGNOSIS — N183 Chronic kidney disease, stage 3 (moderate): Secondary | ICD-10-CM | POA: Diagnosis not present

## 2016-11-23 DIAGNOSIS — D61818 Other pancytopenia: Secondary | ICD-10-CM | POA: Diagnosis not present

## 2016-11-23 DIAGNOSIS — C189 Malignant neoplasm of colon, unspecified: Secondary | ICD-10-CM | POA: Diagnosis not present

## 2016-11-23 DIAGNOSIS — F411 Generalized anxiety disorder: Secondary | ICD-10-CM | POA: Diagnosis not present

## 2016-11-23 DIAGNOSIS — N183 Chronic kidney disease, stage 3 (moderate): Secondary | ICD-10-CM | POA: Diagnosis not present

## 2016-11-23 DIAGNOSIS — I10 Essential (primary) hypertension: Secondary | ICD-10-CM | POA: Diagnosis not present

## 2016-11-23 DIAGNOSIS — D469 Myelodysplastic syndrome, unspecified: Secondary | ICD-10-CM | POA: Diagnosis not present

## 2016-11-28 ENCOUNTER — Other Ambulatory Visit (HOSPITAL_BASED_OUTPATIENT_CLINIC_OR_DEPARTMENT_OTHER)

## 2016-11-28 ENCOUNTER — Ambulatory Visit (HOSPITAL_BASED_OUTPATIENT_CLINIC_OR_DEPARTMENT_OTHER): Payer: PRIVATE HEALTH INSURANCE | Admitting: Nurse Practitioner

## 2016-11-28 ENCOUNTER — Telehealth: Payer: Self-pay | Admitting: Oncology

## 2016-11-28 VITALS — BP 133/61 | HR 62 | Temp 98.6°F | Resp 18 | Ht 71.0 in | Wt 182.1 lb

## 2016-11-28 DIAGNOSIS — Z85038 Personal history of other malignant neoplasm of large intestine: Secondary | ICD-10-CM

## 2016-11-28 DIAGNOSIS — D469 Myelodysplastic syndrome, unspecified: Secondary | ICD-10-CM

## 2016-11-28 LAB — CBC WITH DIFFERENTIAL/PLATELET
BASO%: 0.3 % (ref 0.0–2.0)
Basophils Absolute: 0 10*3/uL (ref 0.0–0.1)
EOS%: 0.3 % (ref 0.0–7.0)
Eosinophils Absolute: 0 10*3/uL (ref 0.0–0.5)
HCT: 33.9 % — ABNORMAL LOW (ref 38.4–49.9)
HGB: 11.6 g/dL — ABNORMAL LOW (ref 13.0–17.1)
LYMPH%: 24 % (ref 14.0–49.0)
MCH: 30.1 pg (ref 27.2–33.4)
MCHC: 34.2 g/dL (ref 32.0–36.0)
MCV: 87.8 fL (ref 79.3–98.0)
MONO#: 1.2 10*3/uL — AB (ref 0.1–0.9)
MONO%: 33.5 % — AB (ref 0.0–14.0)
NEUT#: 1.5 10*3/uL (ref 1.5–6.5)
NEUT%: 41.9 % (ref 39.0–75.0)
Platelets: 50 10*3/uL — ABNORMAL LOW (ref 140–400)
RBC: 3.86 10*6/uL — AB (ref 4.20–5.82)
RDW: 15.1 % — ABNORMAL HIGH (ref 11.0–14.6)
WBC: 3.5 10*3/uL — ABNORMAL LOW (ref 4.0–10.3)
lymph#: 0.8 10*3/uL — ABNORMAL LOW (ref 0.9–3.3)
nRBC: 0 % (ref 0–0)

## 2016-11-28 NOTE — Telephone Encounter (Signed)
Gave patient AVS and calender per 4/10 los.  

## 2016-11-28 NOTE — Progress Notes (Addendum)
  [place] OFFICE PROGRESS NOTE   Diagnosis: Myelodysplasia, history of colon cancer   INTERVAL HISTORY:   [name] returns as scheduled. He overall is feeling well. He reports a good appetite. He has an occasional nosebleed. No other bleeding. No recent infections. He is followed by hospice.  Objective:  Vital signs in last 24 hours:  Blood pressure 133/61, pulse 62, temperature 98.6 F (37 C), temperature source Oral, resp. rate 18, height '5\' 11"'$  (1.803 m), weight 182 lb 1.6 oz (82.6 kg), SpO2 100 %.    HEENT: No thrush or ulcers. Resp: Lungs clear bilaterally. Cardio: Regular rate and rhythm. GI: Abdomen soft and nontender. No organomegaly. Vascular: No leg edema. Skin: Ecchymosis left upper arm.    Lab Results:  Lab Results  Component Value Date   WBC 3.5 (L) 11/28/2016   HGB 11.6 (L) 11/28/2016   HCT 33.9 (L) 11/28/2016   MCV 87.8 11/28/2016   PLT 50 (L) 11/28/2016   NEUTROABS 1.5 11/28/2016    Imaging:  No results found.  Medications: I have reviewed the patient's current medications.  Assessment/Plan: 1. Stage III colon cancer diagnosed in August 2008, status post adjuvant Xeloda chemotherapy, completed in April 2009. He underwent a colonoscopy 11/16/2011 with multiple polyps. 2. History of increased tearing, status post right tear duct stent placement.  3. History of multiple colonic polyps, status post a negative colonoscopy by Dr. Benson Norway in May 2011.  4. Anxiety disorder.  5. Multiple back surgeries.  6. Hypertension.  7. Gastroesophageal reflux disease, status post a Nissen fundoplication.  8. Macular degeneration, followed by Dr. Zadie Rhine.  9. Right ear "tinnitus," and hearing loss followed by Dr. Ernesto Rutherford.  10. Severe microcytic anemia. Ferritin returned low at 7 on 11/13/2011. He was transfused 2 units of blood. Bone marrow biopsy on 11/28/2011 confirmed decreased iron stores. The hemoglobin normalized. He continues oral  iron. 11. Hemoccult positive stool. He underwent an upper endoscopy on 11/16/2011 with findings of moderate gastritis and question atypical duodenal AVMs. There was no evidence of active bleeding. Colonoscopy also on 11/16/2011 showed multiple polyps, hemorrhoids and diverticula. 12. Thrombocytopenia. Stable. 13. Mild leukopenia. Stable. 14. Mildly elevated LDH 11/13/2011. 15. Mildly elevated PT 11/13/2011. 16. 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 There was no evidence of metastatic carcinoma. Storage iron was decreased. Cytogenetic returned with a normal 93 XY karyotype. A molecular FISH panel was negative. Restaging bone marrow 02/18/2016 showed hypercellular bone marrow for age with persistent myelodysplastic state. Blast cell count slightly higher at 16%. Storage iron present. Cytogenetic analysis was normal.     Disposition: [name] remains stable from a hematologic standpoint. He continues follow-up with the hospice program. He will return for a follow-up visit and CBC in 3 months. He will contact the office in the interim with any problems. We specifically discussed bleeding, signs of infection.  Patient seen with Dr. Benay Spice.    Ned Card ANP/GNP-BC   11/28/2016  11:01 AM  This was a shared visit with Ned Card. [name] is stable from a hematologic standpoint. He will return for an office visit and CBC in 3 months.  Julieanne Manson, M.D.

## 2016-11-29 DIAGNOSIS — D469 Myelodysplastic syndrome, unspecified: Secondary | ICD-10-CM | POA: Diagnosis not present

## 2016-11-29 DIAGNOSIS — C189 Malignant neoplasm of colon, unspecified: Secondary | ICD-10-CM | POA: Diagnosis not present

## 2016-11-29 DIAGNOSIS — D61818 Other pancytopenia: Secondary | ICD-10-CM | POA: Diagnosis not present

## 2016-11-29 DIAGNOSIS — N183 Chronic kidney disease, stage 3 (moderate): Secondary | ICD-10-CM | POA: Diagnosis not present

## 2016-11-29 DIAGNOSIS — I10 Essential (primary) hypertension: Secondary | ICD-10-CM | POA: Diagnosis not present

## 2016-11-29 DIAGNOSIS — F411 Generalized anxiety disorder: Secondary | ICD-10-CM | POA: Diagnosis not present

## 2016-11-30 ENCOUNTER — Other Ambulatory Visit: Payer: Self-pay | Admitting: Oncology

## 2016-11-30 DIAGNOSIS — I10 Essential (primary) hypertension: Secondary | ICD-10-CM | POA: Diagnosis not present

## 2016-11-30 DIAGNOSIS — C189 Malignant neoplasm of colon, unspecified: Secondary | ICD-10-CM | POA: Diagnosis not present

## 2016-11-30 DIAGNOSIS — F411 Generalized anxiety disorder: Secondary | ICD-10-CM | POA: Diagnosis not present

## 2016-11-30 DIAGNOSIS — N183 Chronic kidney disease, stage 3 (moderate): Secondary | ICD-10-CM | POA: Diagnosis not present

## 2016-11-30 DIAGNOSIS — D469 Myelodysplastic syndrome, unspecified: Secondary | ICD-10-CM | POA: Diagnosis not present

## 2016-11-30 DIAGNOSIS — D61818 Other pancytopenia: Secondary | ICD-10-CM | POA: Diagnosis not present

## 2016-12-06 DIAGNOSIS — D61818 Other pancytopenia: Secondary | ICD-10-CM | POA: Diagnosis not present

## 2016-12-06 DIAGNOSIS — I10 Essential (primary) hypertension: Secondary | ICD-10-CM | POA: Diagnosis not present

## 2016-12-06 DIAGNOSIS — N183 Chronic kidney disease, stage 3 (moderate): Secondary | ICD-10-CM | POA: Diagnosis not present

## 2016-12-06 DIAGNOSIS — D469 Myelodysplastic syndrome, unspecified: Secondary | ICD-10-CM | POA: Diagnosis not present

## 2016-12-06 DIAGNOSIS — C189 Malignant neoplasm of colon, unspecified: Secondary | ICD-10-CM | POA: Diagnosis not present

## 2016-12-06 DIAGNOSIS — F411 Generalized anxiety disorder: Secondary | ICD-10-CM | POA: Diagnosis not present

## 2016-12-07 DIAGNOSIS — D61818 Other pancytopenia: Secondary | ICD-10-CM | POA: Diagnosis not present

## 2016-12-07 DIAGNOSIS — D469 Myelodysplastic syndrome, unspecified: Secondary | ICD-10-CM | POA: Diagnosis not present

## 2016-12-07 DIAGNOSIS — N183 Chronic kidney disease, stage 3 (moderate): Secondary | ICD-10-CM | POA: Diagnosis not present

## 2016-12-07 DIAGNOSIS — C189 Malignant neoplasm of colon, unspecified: Secondary | ICD-10-CM | POA: Diagnosis not present

## 2016-12-07 DIAGNOSIS — F411 Generalized anxiety disorder: Secondary | ICD-10-CM | POA: Diagnosis not present

## 2016-12-07 DIAGNOSIS — I10 Essential (primary) hypertension: Secondary | ICD-10-CM | POA: Diagnosis not present

## 2016-12-13 DIAGNOSIS — F411 Generalized anxiety disorder: Secondary | ICD-10-CM | POA: Diagnosis not present

## 2016-12-13 DIAGNOSIS — D469 Myelodysplastic syndrome, unspecified: Secondary | ICD-10-CM | POA: Diagnosis not present

## 2016-12-13 DIAGNOSIS — N183 Chronic kidney disease, stage 3 (moderate): Secondary | ICD-10-CM | POA: Diagnosis not present

## 2016-12-13 DIAGNOSIS — I10 Essential (primary) hypertension: Secondary | ICD-10-CM | POA: Diagnosis not present

## 2016-12-13 DIAGNOSIS — C189 Malignant neoplasm of colon, unspecified: Secondary | ICD-10-CM | POA: Diagnosis not present

## 2016-12-13 DIAGNOSIS — D61818 Other pancytopenia: Secondary | ICD-10-CM | POA: Diagnosis not present

## 2016-12-14 ENCOUNTER — Telehealth: Payer: Self-pay | Admitting: *Deleted

## 2016-12-14 DIAGNOSIS — I10 Essential (primary) hypertension: Secondary | ICD-10-CM | POA: Diagnosis not present

## 2016-12-14 DIAGNOSIS — N183 Chronic kidney disease, stage 3 (moderate): Secondary | ICD-10-CM | POA: Diagnosis not present

## 2016-12-14 DIAGNOSIS — D61818 Other pancytopenia: Secondary | ICD-10-CM | POA: Diagnosis not present

## 2016-12-14 DIAGNOSIS — F411 Generalized anxiety disorder: Secondary | ICD-10-CM | POA: Diagnosis not present

## 2016-12-14 DIAGNOSIS — C189 Malignant neoplasm of colon, unspecified: Secondary | ICD-10-CM | POA: Diagnosis not present

## 2016-12-14 DIAGNOSIS — D469 Myelodysplastic syndrome, unspecified: Secondary | ICD-10-CM | POA: Diagnosis not present

## 2016-12-14 NOTE — Telephone Encounter (Signed)
Message from [place], [place] RN: Pt wants to ask Dr. Benay Spice if he is healthy enough for sexual intercourse. Discussed with Dr. Benay Spice, returned call to pt informing him it is fine for him to be intimate. He voiced appreciation for call.

## 2016-12-19 DIAGNOSIS — K219 Gastro-esophageal reflux disease without esophagitis: Secondary | ICD-10-CM | POA: Diagnosis not present

## 2016-12-19 DIAGNOSIS — N183 Chronic kidney disease, stage 3 (moderate): Secondary | ICD-10-CM | POA: Diagnosis not present

## 2016-12-19 DIAGNOSIS — H3530 Unspecified macular degeneration: Secondary | ICD-10-CM | POA: Diagnosis not present

## 2016-12-19 DIAGNOSIS — D61818 Other pancytopenia: Secondary | ICD-10-CM | POA: Diagnosis not present

## 2016-12-19 DIAGNOSIS — I10 Essential (primary) hypertension: Secondary | ICD-10-CM | POA: Diagnosis not present

## 2016-12-19 DIAGNOSIS — J309 Allergic rhinitis, unspecified: Secondary | ICD-10-CM | POA: Diagnosis not present

## 2016-12-19 DIAGNOSIS — F411 Generalized anxiety disorder: Secondary | ICD-10-CM | POA: Diagnosis not present

## 2016-12-19 DIAGNOSIS — L209 Atopic dermatitis, unspecified: Secondary | ICD-10-CM | POA: Diagnosis not present

## 2016-12-19 DIAGNOSIS — C189 Malignant neoplasm of colon, unspecified: Secondary | ICD-10-CM | POA: Diagnosis not present

## 2016-12-19 DIAGNOSIS — H9319 Tinnitus, unspecified ear: Secondary | ICD-10-CM | POA: Diagnosis not present

## 2016-12-19 DIAGNOSIS — D469 Myelodysplastic syndrome, unspecified: Secondary | ICD-10-CM | POA: Diagnosis not present

## 2016-12-20 DIAGNOSIS — N183 Chronic kidney disease, stage 3 (moderate): Secondary | ICD-10-CM | POA: Diagnosis not present

## 2016-12-20 DIAGNOSIS — D469 Myelodysplastic syndrome, unspecified: Secondary | ICD-10-CM | POA: Diagnosis not present

## 2016-12-20 DIAGNOSIS — F411 Generalized anxiety disorder: Secondary | ICD-10-CM | POA: Diagnosis not present

## 2016-12-20 DIAGNOSIS — D61818 Other pancytopenia: Secondary | ICD-10-CM | POA: Diagnosis not present

## 2016-12-20 DIAGNOSIS — I10 Essential (primary) hypertension: Secondary | ICD-10-CM | POA: Diagnosis not present

## 2016-12-20 DIAGNOSIS — C189 Malignant neoplasm of colon, unspecified: Secondary | ICD-10-CM | POA: Diagnosis not present

## 2016-12-21 DIAGNOSIS — D61818 Other pancytopenia: Secondary | ICD-10-CM | POA: Diagnosis not present

## 2016-12-21 DIAGNOSIS — F411 Generalized anxiety disorder: Secondary | ICD-10-CM | POA: Diagnosis not present

## 2016-12-21 DIAGNOSIS — N183 Chronic kidney disease, stage 3 (moderate): Secondary | ICD-10-CM | POA: Diagnosis not present

## 2016-12-21 DIAGNOSIS — I10 Essential (primary) hypertension: Secondary | ICD-10-CM | POA: Diagnosis not present

## 2016-12-21 DIAGNOSIS — C189 Malignant neoplasm of colon, unspecified: Secondary | ICD-10-CM | POA: Diagnosis not present

## 2016-12-21 DIAGNOSIS — D469 Myelodysplastic syndrome, unspecified: Secondary | ICD-10-CM | POA: Diagnosis not present

## 2016-12-27 DIAGNOSIS — C189 Malignant neoplasm of colon, unspecified: Secondary | ICD-10-CM | POA: Diagnosis not present

## 2016-12-27 DIAGNOSIS — I10 Essential (primary) hypertension: Secondary | ICD-10-CM | POA: Diagnosis not present

## 2016-12-27 DIAGNOSIS — N183 Chronic kidney disease, stage 3 (moderate): Secondary | ICD-10-CM | POA: Diagnosis not present

## 2016-12-27 DIAGNOSIS — D469 Myelodysplastic syndrome, unspecified: Secondary | ICD-10-CM | POA: Diagnosis not present

## 2016-12-27 DIAGNOSIS — D61818 Other pancytopenia: Secondary | ICD-10-CM | POA: Diagnosis not present

## 2016-12-27 DIAGNOSIS — F411 Generalized anxiety disorder: Secondary | ICD-10-CM | POA: Diagnosis not present

## 2016-12-28 ENCOUNTER — Other Ambulatory Visit: Payer: Self-pay | Admitting: *Deleted

## 2016-12-28 DIAGNOSIS — I10 Essential (primary) hypertension: Secondary | ICD-10-CM | POA: Diagnosis not present

## 2016-12-28 DIAGNOSIS — F411 Generalized anxiety disorder: Secondary | ICD-10-CM | POA: Diagnosis not present

## 2016-12-28 DIAGNOSIS — N183 Chronic kidney disease, stage 3 (moderate): Secondary | ICD-10-CM | POA: Diagnosis not present

## 2016-12-28 DIAGNOSIS — C189 Malignant neoplasm of colon, unspecified: Secondary | ICD-10-CM | POA: Diagnosis not present

## 2016-12-28 DIAGNOSIS — D61818 Other pancytopenia: Secondary | ICD-10-CM | POA: Diagnosis not present

## 2016-12-28 DIAGNOSIS — D469 Myelodysplastic syndrome, unspecified: Secondary | ICD-10-CM | POA: Diagnosis not present

## 2016-12-28 MED ORDER — LORAZEPAM 2 MG PO TABS: ORAL_TABLET | ORAL | 1 refills | Status: DC

## 2016-12-29 DIAGNOSIS — I10 Essential (primary) hypertension: Secondary | ICD-10-CM | POA: Diagnosis not present

## 2016-12-29 DIAGNOSIS — D469 Myelodysplastic syndrome, unspecified: Secondary | ICD-10-CM | POA: Diagnosis not present

## 2016-12-29 DIAGNOSIS — D61818 Other pancytopenia: Secondary | ICD-10-CM | POA: Diagnosis not present

## 2016-12-29 DIAGNOSIS — F411 Generalized anxiety disorder: Secondary | ICD-10-CM | POA: Diagnosis not present

## 2016-12-29 DIAGNOSIS — N183 Chronic kidney disease, stage 3 (moderate): Secondary | ICD-10-CM | POA: Diagnosis not present

## 2016-12-29 DIAGNOSIS — C189 Malignant neoplasm of colon, unspecified: Secondary | ICD-10-CM | POA: Diagnosis not present

## 2017-01-01 DIAGNOSIS — C189 Malignant neoplasm of colon, unspecified: Secondary | ICD-10-CM | POA: Diagnosis not present

## 2017-01-01 DIAGNOSIS — F411 Generalized anxiety disorder: Secondary | ICD-10-CM | POA: Diagnosis not present

## 2017-01-01 DIAGNOSIS — D469 Myelodysplastic syndrome, unspecified: Secondary | ICD-10-CM | POA: Diagnosis not present

## 2017-01-01 DIAGNOSIS — I10 Essential (primary) hypertension: Secondary | ICD-10-CM | POA: Diagnosis not present

## 2017-01-01 DIAGNOSIS — D61818 Other pancytopenia: Secondary | ICD-10-CM | POA: Diagnosis not present

## 2017-01-01 DIAGNOSIS — N183 Chronic kidney disease, stage 3 (moderate): Secondary | ICD-10-CM | POA: Diagnosis not present

## 2017-01-03 DIAGNOSIS — D61818 Other pancytopenia: Secondary | ICD-10-CM | POA: Diagnosis not present

## 2017-01-03 DIAGNOSIS — D469 Myelodysplastic syndrome, unspecified: Secondary | ICD-10-CM | POA: Diagnosis not present

## 2017-01-03 DIAGNOSIS — I10 Essential (primary) hypertension: Secondary | ICD-10-CM | POA: Diagnosis not present

## 2017-01-03 DIAGNOSIS — N183 Chronic kidney disease, stage 3 (moderate): Secondary | ICD-10-CM | POA: Diagnosis not present

## 2017-01-03 DIAGNOSIS — F411 Generalized anxiety disorder: Secondary | ICD-10-CM | POA: Diagnosis not present

## 2017-01-03 DIAGNOSIS — C189 Malignant neoplasm of colon, unspecified: Secondary | ICD-10-CM | POA: Diagnosis not present

## 2017-01-04 DIAGNOSIS — D469 Myelodysplastic syndrome, unspecified: Secondary | ICD-10-CM | POA: Diagnosis not present

## 2017-01-04 DIAGNOSIS — F411 Generalized anxiety disorder: Secondary | ICD-10-CM | POA: Diagnosis not present

## 2017-01-04 DIAGNOSIS — D61818 Other pancytopenia: Secondary | ICD-10-CM | POA: Diagnosis not present

## 2017-01-04 DIAGNOSIS — C189 Malignant neoplasm of colon, unspecified: Secondary | ICD-10-CM | POA: Diagnosis not present

## 2017-01-04 DIAGNOSIS — I10 Essential (primary) hypertension: Secondary | ICD-10-CM | POA: Diagnosis not present

## 2017-01-04 DIAGNOSIS — N183 Chronic kidney disease, stage 3 (moderate): Secondary | ICD-10-CM | POA: Diagnosis not present

## 2017-01-10 DIAGNOSIS — C189 Malignant neoplasm of colon, unspecified: Secondary | ICD-10-CM | POA: Diagnosis not present

## 2017-01-10 DIAGNOSIS — N183 Chronic kidney disease, stage 3 (moderate): Secondary | ICD-10-CM | POA: Diagnosis not present

## 2017-01-10 DIAGNOSIS — D61818 Other pancytopenia: Secondary | ICD-10-CM | POA: Diagnosis not present

## 2017-01-10 DIAGNOSIS — D469 Myelodysplastic syndrome, unspecified: Secondary | ICD-10-CM | POA: Diagnosis not present

## 2017-01-10 DIAGNOSIS — F411 Generalized anxiety disorder: Secondary | ICD-10-CM | POA: Diagnosis not present

## 2017-01-10 DIAGNOSIS — I10 Essential (primary) hypertension: Secondary | ICD-10-CM | POA: Diagnosis not present

## 2017-01-11 ENCOUNTER — Other Ambulatory Visit: Payer: Self-pay | Admitting: Oncology

## 2017-01-11 ENCOUNTER — Telehealth: Payer: Self-pay

## 2017-01-11 DIAGNOSIS — D469 Myelodysplastic syndrome, unspecified: Secondary | ICD-10-CM | POA: Diagnosis not present

## 2017-01-11 DIAGNOSIS — N183 Chronic kidney disease, stage 3 (moderate): Secondary | ICD-10-CM | POA: Diagnosis not present

## 2017-01-11 DIAGNOSIS — C189 Malignant neoplasm of colon, unspecified: Secondary | ICD-10-CM | POA: Diagnosis not present

## 2017-01-11 DIAGNOSIS — I10 Essential (primary) hypertension: Secondary | ICD-10-CM | POA: Diagnosis not present

## 2017-01-11 DIAGNOSIS — D61818 Other pancytopenia: Secondary | ICD-10-CM | POA: Diagnosis not present

## 2017-01-11 DIAGNOSIS — F411 Generalized anxiety disorder: Secondary | ICD-10-CM | POA: Diagnosis not present

## 2017-01-11 MED ORDER — METOPROLOL SUCCINATE ER 25 MG PO TB24: 25.0000 mg | ORAL_TABLET | Freq: Every day | ORAL | 1 refills | Status: DC

## 2017-01-11 NOTE — Telephone Encounter (Signed)
Vickie with hospice called to request metoprolol refill to [facility]. Done per protocol

## 2017-01-12 DIAGNOSIS — D469 Myelodysplastic syndrome, unspecified: Secondary | ICD-10-CM | POA: Diagnosis not present

## 2017-01-12 DIAGNOSIS — D61818 Other pancytopenia: Secondary | ICD-10-CM | POA: Diagnosis not present

## 2017-01-12 DIAGNOSIS — N183 Chronic kidney disease, stage 3 (moderate): Secondary | ICD-10-CM | POA: Diagnosis not present

## 2017-01-12 DIAGNOSIS — I10 Essential (primary) hypertension: Secondary | ICD-10-CM | POA: Diagnosis not present

## 2017-01-12 DIAGNOSIS — F411 Generalized anxiety disorder: Secondary | ICD-10-CM | POA: Diagnosis not present

## 2017-01-12 DIAGNOSIS — C189 Malignant neoplasm of colon, unspecified: Secondary | ICD-10-CM | POA: Diagnosis not present

## 2017-01-17 DIAGNOSIS — D61818 Other pancytopenia: Secondary | ICD-10-CM | POA: Diagnosis not present

## 2017-01-17 DIAGNOSIS — C189 Malignant neoplasm of colon, unspecified: Secondary | ICD-10-CM | POA: Diagnosis not present

## 2017-01-17 DIAGNOSIS — N183 Chronic kidney disease, stage 3 (moderate): Secondary | ICD-10-CM | POA: Diagnosis not present

## 2017-01-17 DIAGNOSIS — F411 Generalized anxiety disorder: Secondary | ICD-10-CM | POA: Diagnosis not present

## 2017-01-17 DIAGNOSIS — I10 Essential (primary) hypertension: Secondary | ICD-10-CM | POA: Diagnosis not present

## 2017-01-17 DIAGNOSIS — D469 Myelodysplastic syndrome, unspecified: Secondary | ICD-10-CM | POA: Diagnosis not present

## 2017-01-18 DIAGNOSIS — D61818 Other pancytopenia: Secondary | ICD-10-CM | POA: Diagnosis not present

## 2017-01-18 DIAGNOSIS — N183 Chronic kidney disease, stage 3 (moderate): Secondary | ICD-10-CM | POA: Diagnosis not present

## 2017-01-18 DIAGNOSIS — C189 Malignant neoplasm of colon, unspecified: Secondary | ICD-10-CM | POA: Diagnosis not present

## 2017-01-18 DIAGNOSIS — F411 Generalized anxiety disorder: Secondary | ICD-10-CM | POA: Diagnosis not present

## 2017-01-18 DIAGNOSIS — D469 Myelodysplastic syndrome, unspecified: Secondary | ICD-10-CM | POA: Diagnosis not present

## 2017-01-18 DIAGNOSIS — I10 Essential (primary) hypertension: Secondary | ICD-10-CM | POA: Diagnosis not present

## 2017-01-19 DIAGNOSIS — H9319 Tinnitus, unspecified ear: Secondary | ICD-10-CM | POA: Diagnosis not present

## 2017-01-19 DIAGNOSIS — D469 Myelodysplastic syndrome, unspecified: Secondary | ICD-10-CM | POA: Diagnosis not present

## 2017-01-19 DIAGNOSIS — D61818 Other pancytopenia: Secondary | ICD-10-CM | POA: Diagnosis not present

## 2017-01-19 DIAGNOSIS — J309 Allergic rhinitis, unspecified: Secondary | ICD-10-CM | POA: Diagnosis not present

## 2017-01-19 DIAGNOSIS — I10 Essential (primary) hypertension: Secondary | ICD-10-CM | POA: Diagnosis not present

## 2017-01-19 DIAGNOSIS — F411 Generalized anxiety disorder: Secondary | ICD-10-CM | POA: Diagnosis not present

## 2017-01-19 DIAGNOSIS — H3530 Unspecified macular degeneration: Secondary | ICD-10-CM | POA: Diagnosis not present

## 2017-01-19 DIAGNOSIS — N183 Chronic kidney disease, stage 3 (moderate): Secondary | ICD-10-CM | POA: Diagnosis not present

## 2017-01-19 DIAGNOSIS — K219 Gastro-esophageal reflux disease without esophagitis: Secondary | ICD-10-CM | POA: Diagnosis not present

## 2017-01-19 DIAGNOSIS — L209 Atopic dermatitis, unspecified: Secondary | ICD-10-CM | POA: Diagnosis not present

## 2017-01-19 DIAGNOSIS — C189 Malignant neoplasm of colon, unspecified: Secondary | ICD-10-CM | POA: Diagnosis not present

## 2017-01-24 DIAGNOSIS — D61818 Other pancytopenia: Secondary | ICD-10-CM | POA: Diagnosis not present

## 2017-01-24 DIAGNOSIS — F411 Generalized anxiety disorder: Secondary | ICD-10-CM | POA: Diagnosis not present

## 2017-01-24 DIAGNOSIS — C189 Malignant neoplasm of colon, unspecified: Secondary | ICD-10-CM | POA: Diagnosis not present

## 2017-01-24 DIAGNOSIS — I10 Essential (primary) hypertension: Secondary | ICD-10-CM | POA: Diagnosis not present

## 2017-01-24 DIAGNOSIS — N183 Chronic kidney disease, stage 3 (moderate): Secondary | ICD-10-CM | POA: Diagnosis not present

## 2017-01-24 DIAGNOSIS — D469 Myelodysplastic syndrome, unspecified: Secondary | ICD-10-CM | POA: Diagnosis not present

## 2017-01-25 DIAGNOSIS — N183 Chronic kidney disease, stage 3 (moderate): Secondary | ICD-10-CM | POA: Diagnosis not present

## 2017-01-25 DIAGNOSIS — I10 Essential (primary) hypertension: Secondary | ICD-10-CM | POA: Diagnosis not present

## 2017-01-25 DIAGNOSIS — C189 Malignant neoplasm of colon, unspecified: Secondary | ICD-10-CM | POA: Diagnosis not present

## 2017-01-25 DIAGNOSIS — F411 Generalized anxiety disorder: Secondary | ICD-10-CM | POA: Diagnosis not present

## 2017-01-25 DIAGNOSIS — D61818 Other pancytopenia: Secondary | ICD-10-CM | POA: Diagnosis not present

## 2017-01-25 DIAGNOSIS — D469 Myelodysplastic syndrome, unspecified: Secondary | ICD-10-CM | POA: Diagnosis not present

## 2017-01-26 ENCOUNTER — Telehealth: Payer: Self-pay | Admitting: *Deleted

## 2017-01-26 DIAGNOSIS — D469 Myelodysplastic syndrome, unspecified: Secondary | ICD-10-CM | POA: Diagnosis not present

## 2017-01-26 DIAGNOSIS — D61818 Other pancytopenia: Secondary | ICD-10-CM | POA: Diagnosis not present

## 2017-01-26 DIAGNOSIS — C189 Malignant neoplasm of colon, unspecified: Secondary | ICD-10-CM | POA: Diagnosis not present

## 2017-01-26 DIAGNOSIS — I10 Essential (primary) hypertension: Secondary | ICD-10-CM | POA: Diagnosis not present

## 2017-01-26 DIAGNOSIS — F411 Generalized anxiety disorder: Secondary | ICD-10-CM | POA: Diagnosis not present

## 2017-01-26 DIAGNOSIS — N183 Chronic kidney disease, stage 3 (moderate): Secondary | ICD-10-CM | POA: Diagnosis not present

## 2017-01-26 NOTE — Telephone Encounter (Signed)
Call received from [facility] with [facility] to inform Dr. Benay Spice that patient has 1+ blood, 1+ protein and no WBC in his u/a from 01/25/17.  U/A obtained d/t pt stated his "urine wasn't coming out right" per Jocelyn Lamer.   Dr. Benay Spice notified and Jocelyn Lamer instructed per Dr. Benay Spice to send u/a for culture and to not do anything else at this time.  Jocelyn Lamer appreciative of assistance.

## 2017-01-31 DIAGNOSIS — D469 Myelodysplastic syndrome, unspecified: Secondary | ICD-10-CM | POA: Diagnosis not present

## 2017-01-31 DIAGNOSIS — N183 Chronic kidney disease, stage 3 (moderate): Secondary | ICD-10-CM | POA: Diagnosis not present

## 2017-01-31 DIAGNOSIS — I10 Essential (primary) hypertension: Secondary | ICD-10-CM | POA: Diagnosis not present

## 2017-01-31 DIAGNOSIS — F411 Generalized anxiety disorder: Secondary | ICD-10-CM | POA: Diagnosis not present

## 2017-01-31 DIAGNOSIS — C189 Malignant neoplasm of colon, unspecified: Secondary | ICD-10-CM | POA: Diagnosis not present

## 2017-01-31 DIAGNOSIS — D61818 Other pancytopenia: Secondary | ICD-10-CM | POA: Diagnosis not present

## 2017-02-01 DIAGNOSIS — I10 Essential (primary) hypertension: Secondary | ICD-10-CM | POA: Diagnosis not present

## 2017-02-01 DIAGNOSIS — N183 Chronic kidney disease, stage 3 (moderate): Secondary | ICD-10-CM | POA: Diagnosis not present

## 2017-02-01 DIAGNOSIS — D469 Myelodysplastic syndrome, unspecified: Secondary | ICD-10-CM | POA: Diagnosis not present

## 2017-02-01 DIAGNOSIS — D61818 Other pancytopenia: Secondary | ICD-10-CM | POA: Diagnosis not present

## 2017-02-01 DIAGNOSIS — C189 Malignant neoplasm of colon, unspecified: Secondary | ICD-10-CM | POA: Diagnosis not present

## 2017-02-01 DIAGNOSIS — F411 Generalized anxiety disorder: Secondary | ICD-10-CM | POA: Diagnosis not present

## 2017-02-07 DIAGNOSIS — I10 Essential (primary) hypertension: Secondary | ICD-10-CM | POA: Diagnosis not present

## 2017-02-07 DIAGNOSIS — F411 Generalized anxiety disorder: Secondary | ICD-10-CM | POA: Diagnosis not present

## 2017-02-07 DIAGNOSIS — D469 Myelodysplastic syndrome, unspecified: Secondary | ICD-10-CM | POA: Diagnosis not present

## 2017-02-07 DIAGNOSIS — N183 Chronic kidney disease, stage 3 (moderate): Secondary | ICD-10-CM | POA: Diagnosis not present

## 2017-02-07 DIAGNOSIS — C189 Malignant neoplasm of colon, unspecified: Secondary | ICD-10-CM | POA: Diagnosis not present

## 2017-02-07 DIAGNOSIS — D61818 Other pancytopenia: Secondary | ICD-10-CM | POA: Diagnosis not present

## 2017-02-12 DIAGNOSIS — I10 Essential (primary) hypertension: Secondary | ICD-10-CM | POA: Diagnosis not present

## 2017-02-12 DIAGNOSIS — F411 Generalized anxiety disorder: Secondary | ICD-10-CM | POA: Diagnosis not present

## 2017-02-12 DIAGNOSIS — D469 Myelodysplastic syndrome, unspecified: Secondary | ICD-10-CM | POA: Diagnosis not present

## 2017-02-12 DIAGNOSIS — C189 Malignant neoplasm of colon, unspecified: Secondary | ICD-10-CM | POA: Diagnosis not present

## 2017-02-12 DIAGNOSIS — N183 Chronic kidney disease, stage 3 (moderate): Secondary | ICD-10-CM | POA: Diagnosis not present

## 2017-02-12 DIAGNOSIS — D61818 Other pancytopenia: Secondary | ICD-10-CM | POA: Diagnosis not present

## 2017-02-13 DIAGNOSIS — D469 Myelodysplastic syndrome, unspecified: Secondary | ICD-10-CM | POA: Diagnosis not present

## 2017-02-13 DIAGNOSIS — C189 Malignant neoplasm of colon, unspecified: Secondary | ICD-10-CM | POA: Diagnosis not present

## 2017-02-13 DIAGNOSIS — I10 Essential (primary) hypertension: Secondary | ICD-10-CM | POA: Diagnosis not present

## 2017-02-13 DIAGNOSIS — N183 Chronic kidney disease, stage 3 (moderate): Secondary | ICD-10-CM | POA: Diagnosis not present

## 2017-02-13 DIAGNOSIS — D61818 Other pancytopenia: Secondary | ICD-10-CM | POA: Diagnosis not present

## 2017-02-13 DIAGNOSIS — F411 Generalized anxiety disorder: Secondary | ICD-10-CM | POA: Diagnosis not present

## 2017-02-14 DIAGNOSIS — C189 Malignant neoplasm of colon, unspecified: Secondary | ICD-10-CM | POA: Diagnosis not present

## 2017-02-14 DIAGNOSIS — F411 Generalized anxiety disorder: Secondary | ICD-10-CM | POA: Diagnosis not present

## 2017-02-14 DIAGNOSIS — N183 Chronic kidney disease, stage 3 (moderate): Secondary | ICD-10-CM | POA: Diagnosis not present

## 2017-02-14 DIAGNOSIS — I10 Essential (primary) hypertension: Secondary | ICD-10-CM | POA: Diagnosis not present

## 2017-02-14 DIAGNOSIS — D469 Myelodysplastic syndrome, unspecified: Secondary | ICD-10-CM | POA: Diagnosis not present

## 2017-02-14 DIAGNOSIS — D61818 Other pancytopenia: Secondary | ICD-10-CM | POA: Diagnosis not present

## 2017-02-18 DIAGNOSIS — J309 Allergic rhinitis, unspecified: Secondary | ICD-10-CM | POA: Diagnosis not present

## 2017-02-18 DIAGNOSIS — I10 Essential (primary) hypertension: Secondary | ICD-10-CM | POA: Diagnosis not present

## 2017-02-18 DIAGNOSIS — N183 Chronic kidney disease, stage 3 (moderate): Secondary | ICD-10-CM | POA: Diagnosis not present

## 2017-02-18 DIAGNOSIS — K219 Gastro-esophageal reflux disease without esophagitis: Secondary | ICD-10-CM | POA: Diagnosis not present

## 2017-02-18 DIAGNOSIS — L209 Atopic dermatitis, unspecified: Secondary | ICD-10-CM | POA: Diagnosis not present

## 2017-02-18 DIAGNOSIS — H3530 Unspecified macular degeneration: Secondary | ICD-10-CM | POA: Diagnosis not present

## 2017-02-18 DIAGNOSIS — D61818 Other pancytopenia: Secondary | ICD-10-CM | POA: Diagnosis not present

## 2017-02-18 DIAGNOSIS — C189 Malignant neoplasm of colon, unspecified: Secondary | ICD-10-CM | POA: Diagnosis not present

## 2017-02-18 DIAGNOSIS — H9319 Tinnitus, unspecified ear: Secondary | ICD-10-CM | POA: Diagnosis not present

## 2017-02-18 DIAGNOSIS — F411 Generalized anxiety disorder: Secondary | ICD-10-CM | POA: Diagnosis not present

## 2017-02-18 DIAGNOSIS — D469 Myelodysplastic syndrome, unspecified: Secondary | ICD-10-CM | POA: Diagnosis not present

## 2017-02-20 DIAGNOSIS — D61818 Other pancytopenia: Secondary | ICD-10-CM | POA: Diagnosis not present

## 2017-02-20 DIAGNOSIS — F411 Generalized anxiety disorder: Secondary | ICD-10-CM | POA: Diagnosis not present

## 2017-02-20 DIAGNOSIS — C189 Malignant neoplasm of colon, unspecified: Secondary | ICD-10-CM | POA: Diagnosis not present

## 2017-02-20 DIAGNOSIS — N183 Chronic kidney disease, stage 3 (moderate): Secondary | ICD-10-CM | POA: Diagnosis not present

## 2017-02-20 DIAGNOSIS — D469 Myelodysplastic syndrome, unspecified: Secondary | ICD-10-CM | POA: Diagnosis not present

## 2017-02-20 DIAGNOSIS — I10 Essential (primary) hypertension: Secondary | ICD-10-CM | POA: Diagnosis not present

## 2017-02-22 DIAGNOSIS — C189 Malignant neoplasm of colon, unspecified: Secondary | ICD-10-CM | POA: Diagnosis not present

## 2017-02-22 DIAGNOSIS — N183 Chronic kidney disease, stage 3 (moderate): Secondary | ICD-10-CM | POA: Diagnosis not present

## 2017-02-22 DIAGNOSIS — F411 Generalized anxiety disorder: Secondary | ICD-10-CM | POA: Diagnosis not present

## 2017-02-22 DIAGNOSIS — I10 Essential (primary) hypertension: Secondary | ICD-10-CM | POA: Diagnosis not present

## 2017-02-22 DIAGNOSIS — D469 Myelodysplastic syndrome, unspecified: Secondary | ICD-10-CM | POA: Diagnosis not present

## 2017-02-22 DIAGNOSIS — D61818 Other pancytopenia: Secondary | ICD-10-CM | POA: Diagnosis not present

## 2017-02-23 DIAGNOSIS — D61818 Other pancytopenia: Secondary | ICD-10-CM | POA: Diagnosis not present

## 2017-02-23 DIAGNOSIS — C189 Malignant neoplasm of colon, unspecified: Secondary | ICD-10-CM | POA: Diagnosis not present

## 2017-02-23 DIAGNOSIS — N183 Chronic kidney disease, stage 3 (moderate): Secondary | ICD-10-CM | POA: Diagnosis not present

## 2017-02-23 DIAGNOSIS — I10 Essential (primary) hypertension: Secondary | ICD-10-CM | POA: Diagnosis not present

## 2017-02-23 DIAGNOSIS — F411 Generalized anxiety disorder: Secondary | ICD-10-CM | POA: Diagnosis not present

## 2017-02-23 DIAGNOSIS — D469 Myelodysplastic syndrome, unspecified: Secondary | ICD-10-CM | POA: Diagnosis not present

## 2017-02-26 ENCOUNTER — Ambulatory Visit (HOSPITAL_BASED_OUTPATIENT_CLINIC_OR_DEPARTMENT_OTHER): Admitting: Oncology

## 2017-02-26 ENCOUNTER — Telehealth: Payer: Self-pay | Admitting: Oncology

## 2017-02-26 ENCOUNTER — Other Ambulatory Visit (HOSPITAL_BASED_OUTPATIENT_CLINIC_OR_DEPARTMENT_OTHER)

## 2017-02-26 ENCOUNTER — Encounter: Payer: Self-pay | Admitting: Oncology

## 2017-02-26 VITALS — BP 145/58 | HR 122 | Temp 97.7°F | Resp 18 | Ht 71.0 in | Wt 188.1 lb

## 2017-02-26 DIAGNOSIS — D469 Myelodysplastic syndrome, unspecified: Secondary | ICD-10-CM

## 2017-02-26 DIAGNOSIS — Z85038 Personal history of other malignant neoplasm of large intestine: Secondary | ICD-10-CM | POA: Diagnosis not present

## 2017-02-26 DIAGNOSIS — M899 Disorder of bone, unspecified: Secondary | ICD-10-CM | POA: Diagnosis not present

## 2017-02-26 LAB — CBC WITH DIFFERENTIAL/PLATELET
BASO%: 0.4 % (ref 0.0–2.0)
BASOS ABS: 0 10*3/uL (ref 0.0–0.1)
EOS ABS: 0 10*3/uL (ref 0.0–0.5)
EOS%: 0.2 % (ref 0.0–7.0)
HCT: 35.8 % — ABNORMAL LOW (ref 38.4–49.9)
HEMOGLOBIN: 11.9 g/dL — AB (ref 13.0–17.1)
LYMPH%: 20 % (ref 14.0–49.0)
MCH: 29.6 pg (ref 27.2–33.4)
MCHC: 33.2 g/dL (ref 32.0–36.0)
MCV: 89 fL (ref 79.3–98.0)
MONO#: 1.2 10*3/uL — ABNORMAL HIGH (ref 0.1–0.9)
MONO%: 37.4 % — AB (ref 0.0–14.0)
NEUT#: 1.4 10*3/uL — ABNORMAL LOW (ref 1.5–6.5)
NEUT%: 42 % (ref 39.0–75.0)
Platelets: 55 10*3/uL — ABNORMAL LOW (ref 140–400)
RBC: 4.02 10*6/uL — ABNORMAL LOW (ref 4.20–5.82)
RDW: 16.1 % — ABNORMAL HIGH (ref 11.0–14.6)
WBC: 3.3 10*3/uL — ABNORMAL LOW (ref 4.0–10.3)
lymph#: 0.7 10*3/uL — ABNORMAL LOW (ref 0.9–3.3)

## 2017-02-26 NOTE — Progress Notes (Signed)
  [place] OFFICE PROGRESS NOTE   Diagnosis: Myelodysplasia  INTERVAL HISTORY:   [name] returns as scheduled. Good appetite. He has noted a lump at the right patella. No other complaint.  Objective:  Vital signs in last 24 hours:  Blood pressure (!) 145/58, pulse (!) 122, temperature 97.7 F (36.5 C), temperature source Oral, resp. rate 18, height _0  (1.803 m), weight 188 lb 1.6 oz (85.3 kg), SpO2 100 %.    HEENT: No thrush or ulcers Lymphatics: No cervical, supraclavicular, or axillary nodes Resp: Lungs clear bilaterally Cardio: Regular rate and rhythm GI: No hepatosplenomegaly Vascular: No leg edema  Skin: Soft round to 3 similar cutaneous mass at the right patella, ecchymosis at the left hand  Lab Results:  Lab Results  Component Value Date   WBC 3.3 (L) 02/26/2017   HGB 11.9 (L) 02/26/2017   HCT 35.8 (L) 02/26/2017   MCV 89.0 02/26/2017   PLT 55 (L) 02/26/2017   NEUTROABS 1.4 (L) 02/26/2017    Medications: I have reviewed the patient's current medications.  Assessment/Plan: 1. Stage III colon cancer diagnosed in August 2008, status post adjuvant Xeloda chemotherapy, completed in April 2009. He underwent a colonoscopy 11/16/2011 with multiple polyps. 2. History of increased tearing, status post right tear duct stent placement.  3. History of multiple colonic polyps, status post a negative colonoscopy by Dr. Benson Norway in May 2011.  4. Anxiety disorder.  5. Multiple back surgeries.  6. Hypertension.  7. Gastroesophageal reflux disease, status post a Nissen fundoplication.  8. Macular degeneration, followed by Dr. Zadie Rhine.  9. Right ear "tinnitus," and hearing loss followed by Dr. Ernesto Rutherford.  10. Severe microcytic anemia. Ferritin returned low at 7 on 11/13/2011. He was transfused 2 units of blood. Bone marrow biopsy on 11/28/2011 confirmed decreased iron stores. The hemoglobin normalized. He continues oral iron. 11. Hemoccult positive  stool. He underwent an upper endoscopy on 11/16/2011 with findings of moderate gastritis and question atypical duodenal AVMs. There was no evidence of active bleeding. Colonoscopy also on 11/16/2011 showed multiple polyps, hemorrhoids and diverticula. 12. Thrombocytopenia. Stable. 13. Mild leukopenia. Stable. 14. Mildly elevated LDH 11/13/2011. 15. Mildly elevated PT 11/13/2011. 16. 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 There was no evidence of metastatic carcinoma. Storage iron was decreased. Cytogenetic returned with a normal 29 XY karyotype. A molecular FISH panel was negative. Restaging bone marrow 02/18/2016 showed hypercellular bone marrow for age with persistent myelodysplastic state. Blast cell count slightly higher at 16%. Storage iron present. Cytogenetic analysis was normal.    Disposition:  [name] is stable from a hematologic standpoint. He will return for an office visit and CBC in 3 months. The lesion at the right patella is most likely a lipoma. 15 minutes were spent with the patient today. The majority of the time was used for counseling and coordination of care.  Donneta Romberg, MD  02/26/2017  11:37 AM

## 2017-02-26 NOTE — Telephone Encounter (Signed)
Scheduled appt per 7/9 los - Gave patient AVS and calender per los.  

## 2017-02-26 NOTE — Progress Notes (Signed)
Patient and son came in with a bill that he has a question about. It appears that one of his insurances was being billed but not the other. Emailed billing to review and contact the patient's son with an update. They thanked me and verbalized understanding.

## 2017-02-28 DIAGNOSIS — F411 Generalized anxiety disorder: Secondary | ICD-10-CM | POA: Diagnosis not present

## 2017-02-28 DIAGNOSIS — D61818 Other pancytopenia: Secondary | ICD-10-CM | POA: Diagnosis not present

## 2017-02-28 DIAGNOSIS — I10 Essential (primary) hypertension: Secondary | ICD-10-CM | POA: Diagnosis not present

## 2017-02-28 DIAGNOSIS — N183 Chronic kidney disease, stage 3 (moderate): Secondary | ICD-10-CM | POA: Diagnosis not present

## 2017-02-28 DIAGNOSIS — C189 Malignant neoplasm of colon, unspecified: Secondary | ICD-10-CM | POA: Diagnosis not present

## 2017-02-28 DIAGNOSIS — D469 Myelodysplastic syndrome, unspecified: Secondary | ICD-10-CM | POA: Diagnosis not present

## 2017-03-01 DIAGNOSIS — I10 Essential (primary) hypertension: Secondary | ICD-10-CM | POA: Diagnosis not present

## 2017-03-01 DIAGNOSIS — N183 Chronic kidney disease, stage 3 (moderate): Secondary | ICD-10-CM | POA: Diagnosis not present

## 2017-03-01 DIAGNOSIS — D469 Myelodysplastic syndrome, unspecified: Secondary | ICD-10-CM | POA: Diagnosis not present

## 2017-03-01 DIAGNOSIS — F411 Generalized anxiety disorder: Secondary | ICD-10-CM | POA: Diagnosis not present

## 2017-03-01 DIAGNOSIS — D61818 Other pancytopenia: Secondary | ICD-10-CM | POA: Diagnosis not present

## 2017-03-01 DIAGNOSIS — C189 Malignant neoplasm of colon, unspecified: Secondary | ICD-10-CM | POA: Diagnosis not present

## 2017-03-06 DIAGNOSIS — F411 Generalized anxiety disorder: Secondary | ICD-10-CM | POA: Diagnosis not present

## 2017-03-06 DIAGNOSIS — D469 Myelodysplastic syndrome, unspecified: Secondary | ICD-10-CM | POA: Diagnosis not present

## 2017-03-06 DIAGNOSIS — D61818 Other pancytopenia: Secondary | ICD-10-CM | POA: Diagnosis not present

## 2017-03-06 DIAGNOSIS — C189 Malignant neoplasm of colon, unspecified: Secondary | ICD-10-CM | POA: Diagnosis not present

## 2017-03-06 DIAGNOSIS — I10 Essential (primary) hypertension: Secondary | ICD-10-CM | POA: Diagnosis not present

## 2017-03-06 DIAGNOSIS — N183 Chronic kidney disease, stage 3 (moderate): Secondary | ICD-10-CM | POA: Diagnosis not present

## 2017-03-07 DIAGNOSIS — D469 Myelodysplastic syndrome, unspecified: Secondary | ICD-10-CM | POA: Diagnosis not present

## 2017-03-07 DIAGNOSIS — I10 Essential (primary) hypertension: Secondary | ICD-10-CM | POA: Diagnosis not present

## 2017-03-07 DIAGNOSIS — N183 Chronic kidney disease, stage 3 (moderate): Secondary | ICD-10-CM | POA: Diagnosis not present

## 2017-03-07 DIAGNOSIS — F411 Generalized anxiety disorder: Secondary | ICD-10-CM | POA: Diagnosis not present

## 2017-03-07 DIAGNOSIS — D61818 Other pancytopenia: Secondary | ICD-10-CM | POA: Diagnosis not present

## 2017-03-07 DIAGNOSIS — C189 Malignant neoplasm of colon, unspecified: Secondary | ICD-10-CM | POA: Diagnosis not present

## 2017-03-13 DIAGNOSIS — D469 Myelodysplastic syndrome, unspecified: Secondary | ICD-10-CM | POA: Diagnosis not present

## 2017-03-13 DIAGNOSIS — C189 Malignant neoplasm of colon, unspecified: Secondary | ICD-10-CM | POA: Diagnosis not present

## 2017-03-13 DIAGNOSIS — F411 Generalized anxiety disorder: Secondary | ICD-10-CM | POA: Diagnosis not present

## 2017-03-13 DIAGNOSIS — D61818 Other pancytopenia: Secondary | ICD-10-CM | POA: Diagnosis not present

## 2017-03-13 DIAGNOSIS — N183 Chronic kidney disease, stage 3 (moderate): Secondary | ICD-10-CM | POA: Diagnosis not present

## 2017-03-13 DIAGNOSIS — I10 Essential (primary) hypertension: Secondary | ICD-10-CM | POA: Diagnosis not present

## 2017-03-14 DIAGNOSIS — C189 Malignant neoplasm of colon, unspecified: Secondary | ICD-10-CM | POA: Diagnosis not present

## 2017-03-14 DIAGNOSIS — I10 Essential (primary) hypertension: Secondary | ICD-10-CM | POA: Diagnosis not present

## 2017-03-14 DIAGNOSIS — D469 Myelodysplastic syndrome, unspecified: Secondary | ICD-10-CM | POA: Diagnosis not present

## 2017-03-14 DIAGNOSIS — N183 Chronic kidney disease, stage 3 (moderate): Secondary | ICD-10-CM | POA: Diagnosis not present

## 2017-03-14 DIAGNOSIS — F411 Generalized anxiety disorder: Secondary | ICD-10-CM | POA: Diagnosis not present

## 2017-03-14 DIAGNOSIS — D61818 Other pancytopenia: Secondary | ICD-10-CM | POA: Diagnosis not present

## 2017-03-20 DIAGNOSIS — D469 Myelodysplastic syndrome, unspecified: Secondary | ICD-10-CM | POA: Diagnosis not present

## 2017-03-20 DIAGNOSIS — F411 Generalized anxiety disorder: Secondary | ICD-10-CM | POA: Diagnosis not present

## 2017-03-20 DIAGNOSIS — D61818 Other pancytopenia: Secondary | ICD-10-CM | POA: Diagnosis not present

## 2017-03-20 DIAGNOSIS — I10 Essential (primary) hypertension: Secondary | ICD-10-CM | POA: Diagnosis not present

## 2017-03-20 DIAGNOSIS — N183 Chronic kidney disease, stage 3 (moderate): Secondary | ICD-10-CM | POA: Diagnosis not present

## 2017-03-20 DIAGNOSIS — C189 Malignant neoplasm of colon, unspecified: Secondary | ICD-10-CM | POA: Diagnosis not present

## 2017-03-21 DIAGNOSIS — N183 Chronic kidney disease, stage 3 (moderate): Secondary | ICD-10-CM | POA: Diagnosis not present

## 2017-03-21 DIAGNOSIS — F411 Generalized anxiety disorder: Secondary | ICD-10-CM | POA: Diagnosis not present

## 2017-03-21 DIAGNOSIS — D469 Myelodysplastic syndrome, unspecified: Secondary | ICD-10-CM | POA: Diagnosis not present

## 2017-03-21 DIAGNOSIS — K219 Gastro-esophageal reflux disease without esophagitis: Secondary | ICD-10-CM | POA: Diagnosis not present

## 2017-03-21 DIAGNOSIS — H9319 Tinnitus, unspecified ear: Secondary | ICD-10-CM | POA: Diagnosis not present

## 2017-03-21 DIAGNOSIS — I10 Essential (primary) hypertension: Secondary | ICD-10-CM | POA: Diagnosis not present

## 2017-03-21 DIAGNOSIS — L209 Atopic dermatitis, unspecified: Secondary | ICD-10-CM | POA: Diagnosis not present

## 2017-03-21 DIAGNOSIS — H3530 Unspecified macular degeneration: Secondary | ICD-10-CM | POA: Diagnosis not present

## 2017-03-21 DIAGNOSIS — D61818 Other pancytopenia: Secondary | ICD-10-CM | POA: Diagnosis not present

## 2017-03-21 DIAGNOSIS — J309 Allergic rhinitis, unspecified: Secondary | ICD-10-CM | POA: Diagnosis not present

## 2017-03-21 DIAGNOSIS — C189 Malignant neoplasm of colon, unspecified: Secondary | ICD-10-CM | POA: Diagnosis not present

## 2017-03-22 DIAGNOSIS — C189 Malignant neoplasm of colon, unspecified: Secondary | ICD-10-CM | POA: Diagnosis not present

## 2017-03-22 DIAGNOSIS — F411 Generalized anxiety disorder: Secondary | ICD-10-CM | POA: Diagnosis not present

## 2017-03-22 DIAGNOSIS — D469 Myelodysplastic syndrome, unspecified: Secondary | ICD-10-CM | POA: Diagnosis not present

## 2017-03-22 DIAGNOSIS — I10 Essential (primary) hypertension: Secondary | ICD-10-CM | POA: Diagnosis not present

## 2017-03-22 DIAGNOSIS — D61818 Other pancytopenia: Secondary | ICD-10-CM | POA: Diagnosis not present

## 2017-03-22 DIAGNOSIS — N183 Chronic kidney disease, stage 3 (moderate): Secondary | ICD-10-CM | POA: Diagnosis not present

## 2017-03-27 DIAGNOSIS — I10 Essential (primary) hypertension: Secondary | ICD-10-CM | POA: Diagnosis not present

## 2017-03-27 DIAGNOSIS — D469 Myelodysplastic syndrome, unspecified: Secondary | ICD-10-CM | POA: Diagnosis not present

## 2017-03-27 DIAGNOSIS — N183 Chronic kidney disease, stage 3 (moderate): Secondary | ICD-10-CM | POA: Diagnosis not present

## 2017-03-27 DIAGNOSIS — D61818 Other pancytopenia: Secondary | ICD-10-CM | POA: Diagnosis not present

## 2017-03-27 DIAGNOSIS — F411 Generalized anxiety disorder: Secondary | ICD-10-CM | POA: Diagnosis not present

## 2017-03-27 DIAGNOSIS — C189 Malignant neoplasm of colon, unspecified: Secondary | ICD-10-CM | POA: Diagnosis not present

## 2017-03-28 DIAGNOSIS — I10 Essential (primary) hypertension: Secondary | ICD-10-CM | POA: Diagnosis not present

## 2017-03-28 DIAGNOSIS — F411 Generalized anxiety disorder: Secondary | ICD-10-CM | POA: Diagnosis not present

## 2017-03-28 DIAGNOSIS — D61818 Other pancytopenia: Secondary | ICD-10-CM | POA: Diagnosis not present

## 2017-03-28 DIAGNOSIS — C189 Malignant neoplasm of colon, unspecified: Secondary | ICD-10-CM | POA: Diagnosis not present

## 2017-03-28 DIAGNOSIS — D469 Myelodysplastic syndrome, unspecified: Secondary | ICD-10-CM | POA: Diagnosis not present

## 2017-03-28 DIAGNOSIS — N183 Chronic kidney disease, stage 3 (moderate): Secondary | ICD-10-CM | POA: Diagnosis not present

## 2017-03-30 DIAGNOSIS — C189 Malignant neoplasm of colon, unspecified: Secondary | ICD-10-CM | POA: Diagnosis not present

## 2017-03-30 DIAGNOSIS — D61818 Other pancytopenia: Secondary | ICD-10-CM | POA: Diagnosis not present

## 2017-03-30 DIAGNOSIS — N183 Chronic kidney disease, stage 3 (moderate): Secondary | ICD-10-CM | POA: Diagnosis not present

## 2017-03-30 DIAGNOSIS — D469 Myelodysplastic syndrome, unspecified: Secondary | ICD-10-CM | POA: Diagnosis not present

## 2017-03-30 DIAGNOSIS — I10 Essential (primary) hypertension: Secondary | ICD-10-CM | POA: Diagnosis not present

## 2017-03-30 DIAGNOSIS — F411 Generalized anxiety disorder: Secondary | ICD-10-CM | POA: Diagnosis not present

## 2017-04-03 DIAGNOSIS — C189 Malignant neoplasm of colon, unspecified: Secondary | ICD-10-CM | POA: Diagnosis not present

## 2017-04-03 DIAGNOSIS — F411 Generalized anxiety disorder: Secondary | ICD-10-CM | POA: Diagnosis not present

## 2017-04-03 DIAGNOSIS — I10 Essential (primary) hypertension: Secondary | ICD-10-CM | POA: Diagnosis not present

## 2017-04-03 DIAGNOSIS — D469 Myelodysplastic syndrome, unspecified: Secondary | ICD-10-CM | POA: Diagnosis not present

## 2017-04-03 DIAGNOSIS — N183 Chronic kidney disease, stage 3 (moderate): Secondary | ICD-10-CM | POA: Diagnosis not present

## 2017-04-03 DIAGNOSIS — D61818 Other pancytopenia: Secondary | ICD-10-CM | POA: Diagnosis not present

## 2017-04-04 DIAGNOSIS — F411 Generalized anxiety disorder: Secondary | ICD-10-CM | POA: Diagnosis not present

## 2017-04-04 DIAGNOSIS — C189 Malignant neoplasm of colon, unspecified: Secondary | ICD-10-CM | POA: Diagnosis not present

## 2017-04-04 DIAGNOSIS — I10 Essential (primary) hypertension: Secondary | ICD-10-CM | POA: Diagnosis not present

## 2017-04-04 DIAGNOSIS — D61818 Other pancytopenia: Secondary | ICD-10-CM | POA: Diagnosis not present

## 2017-04-04 DIAGNOSIS — D469 Myelodysplastic syndrome, unspecified: Secondary | ICD-10-CM | POA: Diagnosis not present

## 2017-04-04 DIAGNOSIS — N183 Chronic kidney disease, stage 3 (moderate): Secondary | ICD-10-CM | POA: Diagnosis not present

## 2017-04-09 DIAGNOSIS — F411 Generalized anxiety disorder: Secondary | ICD-10-CM | POA: Diagnosis not present

## 2017-04-09 DIAGNOSIS — I10 Essential (primary) hypertension: Secondary | ICD-10-CM | POA: Diagnosis not present

## 2017-04-09 DIAGNOSIS — D61818 Other pancytopenia: Secondary | ICD-10-CM | POA: Diagnosis not present

## 2017-04-09 DIAGNOSIS — D469 Myelodysplastic syndrome, unspecified: Secondary | ICD-10-CM | POA: Diagnosis not present

## 2017-04-09 DIAGNOSIS — C189 Malignant neoplasm of colon, unspecified: Secondary | ICD-10-CM | POA: Diagnosis not present

## 2017-04-09 DIAGNOSIS — N183 Chronic kidney disease, stage 3 (moderate): Secondary | ICD-10-CM | POA: Diagnosis not present

## 2017-04-11 DIAGNOSIS — D469 Myelodysplastic syndrome, unspecified: Secondary | ICD-10-CM | POA: Diagnosis not present

## 2017-04-11 DIAGNOSIS — C189 Malignant neoplasm of colon, unspecified: Secondary | ICD-10-CM | POA: Diagnosis not present

## 2017-04-11 DIAGNOSIS — F411 Generalized anxiety disorder: Secondary | ICD-10-CM | POA: Diagnosis not present

## 2017-04-11 DIAGNOSIS — I10 Essential (primary) hypertension: Secondary | ICD-10-CM | POA: Diagnosis not present

## 2017-04-11 DIAGNOSIS — N183 Chronic kidney disease, stage 3 (moderate): Secondary | ICD-10-CM | POA: Diagnosis not present

## 2017-04-11 DIAGNOSIS — D61818 Other pancytopenia: Secondary | ICD-10-CM | POA: Diagnosis not present

## 2017-04-18 DIAGNOSIS — I10 Essential (primary) hypertension: Secondary | ICD-10-CM | POA: Diagnosis not present

## 2017-04-18 DIAGNOSIS — D61818 Other pancytopenia: Secondary | ICD-10-CM | POA: Diagnosis not present

## 2017-04-18 DIAGNOSIS — D469 Myelodysplastic syndrome, unspecified: Secondary | ICD-10-CM | POA: Diagnosis not present

## 2017-04-18 DIAGNOSIS — N183 Chronic kidney disease, stage 3 (moderate): Secondary | ICD-10-CM | POA: Diagnosis not present

## 2017-04-18 DIAGNOSIS — C189 Malignant neoplasm of colon, unspecified: Secondary | ICD-10-CM | POA: Diagnosis not present

## 2017-04-18 DIAGNOSIS — F411 Generalized anxiety disorder: Secondary | ICD-10-CM | POA: Diagnosis not present

## 2017-04-21 DIAGNOSIS — L209 Atopic dermatitis, unspecified: Secondary | ICD-10-CM | POA: Diagnosis not present

## 2017-04-21 DIAGNOSIS — K219 Gastro-esophageal reflux disease without esophagitis: Secondary | ICD-10-CM | POA: Diagnosis not present

## 2017-04-21 DIAGNOSIS — I10 Essential (primary) hypertension: Secondary | ICD-10-CM | POA: Diagnosis not present

## 2017-04-21 DIAGNOSIS — D469 Myelodysplastic syndrome, unspecified: Secondary | ICD-10-CM | POA: Diagnosis not present

## 2017-04-21 DIAGNOSIS — D61818 Other pancytopenia: Secondary | ICD-10-CM | POA: Diagnosis not present

## 2017-04-21 DIAGNOSIS — F411 Generalized anxiety disorder: Secondary | ICD-10-CM | POA: Diagnosis not present

## 2017-04-21 DIAGNOSIS — N183 Chronic kidney disease, stage 3 (moderate): Secondary | ICD-10-CM | POA: Diagnosis not present

## 2017-04-21 DIAGNOSIS — H9319 Tinnitus, unspecified ear: Secondary | ICD-10-CM | POA: Diagnosis not present

## 2017-04-21 DIAGNOSIS — H3530 Unspecified macular degeneration: Secondary | ICD-10-CM | POA: Diagnosis not present

## 2017-04-21 DIAGNOSIS — J309 Allergic rhinitis, unspecified: Secondary | ICD-10-CM | POA: Diagnosis not present

## 2017-04-21 DIAGNOSIS — C189 Malignant neoplasm of colon, unspecified: Secondary | ICD-10-CM | POA: Diagnosis not present

## 2017-04-25 DIAGNOSIS — N183 Chronic kidney disease, stage 3 (moderate): Secondary | ICD-10-CM | POA: Diagnosis not present

## 2017-04-25 DIAGNOSIS — D469 Myelodysplastic syndrome, unspecified: Secondary | ICD-10-CM | POA: Diagnosis not present

## 2017-04-25 DIAGNOSIS — I10 Essential (primary) hypertension: Secondary | ICD-10-CM | POA: Diagnosis not present

## 2017-04-25 DIAGNOSIS — C189 Malignant neoplasm of colon, unspecified: Secondary | ICD-10-CM | POA: Diagnosis not present

## 2017-04-25 DIAGNOSIS — F411 Generalized anxiety disorder: Secondary | ICD-10-CM | POA: Diagnosis not present

## 2017-04-25 DIAGNOSIS — D61818 Other pancytopenia: Secondary | ICD-10-CM | POA: Diagnosis not present

## 2017-05-02 DIAGNOSIS — D469 Myelodysplastic syndrome, unspecified: Secondary | ICD-10-CM | POA: Diagnosis not present

## 2017-05-02 DIAGNOSIS — F411 Generalized anxiety disorder: Secondary | ICD-10-CM | POA: Diagnosis not present

## 2017-05-02 DIAGNOSIS — D61818 Other pancytopenia: Secondary | ICD-10-CM | POA: Diagnosis not present

## 2017-05-02 DIAGNOSIS — I10 Essential (primary) hypertension: Secondary | ICD-10-CM | POA: Diagnosis not present

## 2017-05-02 DIAGNOSIS — C189 Malignant neoplasm of colon, unspecified: Secondary | ICD-10-CM | POA: Diagnosis not present

## 2017-05-02 DIAGNOSIS — N183 Chronic kidney disease, stage 3 (moderate): Secondary | ICD-10-CM | POA: Diagnosis not present

## 2017-05-08 DIAGNOSIS — I10 Essential (primary) hypertension: Secondary | ICD-10-CM | POA: Diagnosis not present

## 2017-05-08 DIAGNOSIS — F411 Generalized anxiety disorder: Secondary | ICD-10-CM | POA: Diagnosis not present

## 2017-05-08 DIAGNOSIS — D61818 Other pancytopenia: Secondary | ICD-10-CM | POA: Diagnosis not present

## 2017-05-08 DIAGNOSIS — C189 Malignant neoplasm of colon, unspecified: Secondary | ICD-10-CM | POA: Diagnosis not present

## 2017-05-08 DIAGNOSIS — D469 Myelodysplastic syndrome, unspecified: Secondary | ICD-10-CM | POA: Diagnosis not present

## 2017-05-08 DIAGNOSIS — N183 Chronic kidney disease, stage 3 (moderate): Secondary | ICD-10-CM | POA: Diagnosis not present

## 2017-05-09 DIAGNOSIS — C189 Malignant neoplasm of colon, unspecified: Secondary | ICD-10-CM | POA: Diagnosis not present

## 2017-05-09 DIAGNOSIS — N183 Chronic kidney disease, stage 3 (moderate): Secondary | ICD-10-CM | POA: Diagnosis not present

## 2017-05-09 DIAGNOSIS — F411 Generalized anxiety disorder: Secondary | ICD-10-CM | POA: Diagnosis not present

## 2017-05-09 DIAGNOSIS — I10 Essential (primary) hypertension: Secondary | ICD-10-CM | POA: Diagnosis not present

## 2017-05-09 DIAGNOSIS — D61818 Other pancytopenia: Secondary | ICD-10-CM | POA: Diagnosis not present

## 2017-05-09 DIAGNOSIS — D469 Myelodysplastic syndrome, unspecified: Secondary | ICD-10-CM | POA: Diagnosis not present

## 2017-05-10 DIAGNOSIS — F411 Generalized anxiety disorder: Secondary | ICD-10-CM | POA: Diagnosis not present

## 2017-05-10 DIAGNOSIS — I10 Essential (primary) hypertension: Secondary | ICD-10-CM | POA: Diagnosis not present

## 2017-05-10 DIAGNOSIS — D61818 Other pancytopenia: Secondary | ICD-10-CM | POA: Diagnosis not present

## 2017-05-10 DIAGNOSIS — N183 Chronic kidney disease, stage 3 (moderate): Secondary | ICD-10-CM | POA: Diagnosis not present

## 2017-05-10 DIAGNOSIS — D469 Myelodysplastic syndrome, unspecified: Secondary | ICD-10-CM | POA: Diagnosis not present

## 2017-05-10 DIAGNOSIS — C189 Malignant neoplasm of colon, unspecified: Secondary | ICD-10-CM | POA: Diagnosis not present

## 2017-05-15 DIAGNOSIS — C189 Malignant neoplasm of colon, unspecified: Secondary | ICD-10-CM | POA: Diagnosis not present

## 2017-05-15 DIAGNOSIS — N183 Chronic kidney disease, stage 3 (moderate): Secondary | ICD-10-CM | POA: Diagnosis not present

## 2017-05-15 DIAGNOSIS — F411 Generalized anxiety disorder: Secondary | ICD-10-CM | POA: Diagnosis not present

## 2017-05-15 DIAGNOSIS — I10 Essential (primary) hypertension: Secondary | ICD-10-CM | POA: Diagnosis not present

## 2017-05-15 DIAGNOSIS — D469 Myelodysplastic syndrome, unspecified: Secondary | ICD-10-CM | POA: Diagnosis not present

## 2017-05-15 DIAGNOSIS — D61818 Other pancytopenia: Secondary | ICD-10-CM | POA: Diagnosis not present

## 2017-05-16 DIAGNOSIS — D61818 Other pancytopenia: Secondary | ICD-10-CM | POA: Diagnosis not present

## 2017-05-16 DIAGNOSIS — F411 Generalized anxiety disorder: Secondary | ICD-10-CM | POA: Diagnosis not present

## 2017-05-16 DIAGNOSIS — C189 Malignant neoplasm of colon, unspecified: Secondary | ICD-10-CM | POA: Diagnosis not present

## 2017-05-16 DIAGNOSIS — D469 Myelodysplastic syndrome, unspecified: Secondary | ICD-10-CM | POA: Diagnosis not present

## 2017-05-16 DIAGNOSIS — I10 Essential (primary) hypertension: Secondary | ICD-10-CM | POA: Diagnosis not present

## 2017-05-16 DIAGNOSIS — N183 Chronic kidney disease, stage 3 (moderate): Secondary | ICD-10-CM | POA: Diagnosis not present

## 2017-05-21 DIAGNOSIS — C189 Malignant neoplasm of colon, unspecified: Secondary | ICD-10-CM | POA: Diagnosis not present

## 2017-05-21 DIAGNOSIS — L209 Atopic dermatitis, unspecified: Secondary | ICD-10-CM | POA: Diagnosis not present

## 2017-05-21 DIAGNOSIS — J309 Allergic rhinitis, unspecified: Secondary | ICD-10-CM | POA: Diagnosis not present

## 2017-05-21 DIAGNOSIS — D469 Myelodysplastic syndrome, unspecified: Secondary | ICD-10-CM | POA: Diagnosis not present

## 2017-05-21 DIAGNOSIS — K219 Gastro-esophageal reflux disease without esophagitis: Secondary | ICD-10-CM | POA: Diagnosis not present

## 2017-05-21 DIAGNOSIS — D61818 Other pancytopenia: Secondary | ICD-10-CM | POA: Diagnosis not present

## 2017-05-21 DIAGNOSIS — I10 Essential (primary) hypertension: Secondary | ICD-10-CM | POA: Diagnosis not present

## 2017-05-21 DIAGNOSIS — H9319 Tinnitus, unspecified ear: Secondary | ICD-10-CM | POA: Diagnosis not present

## 2017-05-21 DIAGNOSIS — H3530 Unspecified macular degeneration: Secondary | ICD-10-CM | POA: Diagnosis not present

## 2017-05-21 DIAGNOSIS — N183 Chronic kidney disease, stage 3 (moderate): Secondary | ICD-10-CM | POA: Diagnosis not present

## 2017-05-21 DIAGNOSIS — F411 Generalized anxiety disorder: Secondary | ICD-10-CM | POA: Diagnosis not present

## 2017-05-22 ENCOUNTER — Other Ambulatory Visit: Payer: Self-pay | Admitting: Oncology

## 2017-05-22 ENCOUNTER — Telehealth: Payer: Self-pay | Admitting: *Deleted

## 2017-05-22 DIAGNOSIS — D469 Myelodysplastic syndrome, unspecified: Secondary | ICD-10-CM

## 2017-05-22 DIAGNOSIS — D61818 Other pancytopenia: Secondary | ICD-10-CM | POA: Diagnosis not present

## 2017-05-22 DIAGNOSIS — C189 Malignant neoplasm of colon, unspecified: Secondary | ICD-10-CM | POA: Diagnosis not present

## 2017-05-22 DIAGNOSIS — I10 Essential (primary) hypertension: Secondary | ICD-10-CM | POA: Diagnosis not present

## 2017-05-22 DIAGNOSIS — F411 Generalized anxiety disorder: Secondary | ICD-10-CM | POA: Diagnosis not present

## 2017-05-22 DIAGNOSIS — N183 Chronic kidney disease, stage 3 (moderate): Secondary | ICD-10-CM | POA: Diagnosis not present

## 2017-05-22 NOTE — Telephone Encounter (Signed)
Message from [place], [facility] RN requesting refills. Received electronic request from pharmacy, reviewed with MD: Meds called/ escribed.  Almyra Free notified.

## 2017-05-23 DIAGNOSIS — N183 Chronic kidney disease, stage 3 (moderate): Secondary | ICD-10-CM | POA: Diagnosis not present

## 2017-05-23 DIAGNOSIS — I10 Essential (primary) hypertension: Secondary | ICD-10-CM | POA: Diagnosis not present

## 2017-05-23 DIAGNOSIS — D469 Myelodysplastic syndrome, unspecified: Secondary | ICD-10-CM | POA: Diagnosis not present

## 2017-05-23 DIAGNOSIS — F411 Generalized anxiety disorder: Secondary | ICD-10-CM | POA: Diagnosis not present

## 2017-05-23 DIAGNOSIS — C189 Malignant neoplasm of colon, unspecified: Secondary | ICD-10-CM | POA: Diagnosis not present

## 2017-05-23 DIAGNOSIS — D61818 Other pancytopenia: Secondary | ICD-10-CM | POA: Diagnosis not present

## 2017-05-25 ENCOUNTER — Telehealth: Payer: Self-pay

## 2017-05-25 ENCOUNTER — Other Ambulatory Visit: Payer: Self-pay | Admitting: *Deleted

## 2017-05-25 DIAGNOSIS — I10 Essential (primary) hypertension: Secondary | ICD-10-CM

## 2017-05-25 MED ORDER — AMLODIPINE BESYLATE 10 MG PO TABS: 10.0000 mg | ORAL_TABLET | Freq: Every day | ORAL | 3 refills | Status: DC

## 2017-05-25 NOTE — Telephone Encounter (Signed)
Almyra Free with [facility] called about pt's norvasc. Dr Lyman Speller approved 1 refill. Will dr Benay Spice add some refills to the rx to prevent repeat calls for refills. [facility] [employer] [employer].  He will be seeing Dr Benay Spice on Monday. Almyra Free will also be faxing a medication list to please reconcile it at visit on Monday.

## 2017-05-25 NOTE — Telephone Encounter (Signed)
Received fax from [place] with updated medication list. Meds updated in pt's chart. Amlodipine refilled per hospice RN request. OK, per Dr. Benay Spice.

## 2017-05-28 ENCOUNTER — Other Ambulatory Visit (HOSPITAL_BASED_OUTPATIENT_CLINIC_OR_DEPARTMENT_OTHER)

## 2017-05-28 ENCOUNTER — Ambulatory Visit (HOSPITAL_BASED_OUTPATIENT_CLINIC_OR_DEPARTMENT_OTHER): Payer: Medicare Other | Admitting: Nurse Practitioner

## 2017-05-28 ENCOUNTER — Telehealth: Payer: Self-pay | Admitting: Nurse Practitioner

## 2017-05-28 VITALS — BP 126/58 | HR 70 | Temp 98.6°F | Resp 17 | Ht 71.0 in | Wt 189.3 lb

## 2017-05-28 DIAGNOSIS — D469 Myelodysplastic syndrome, unspecified: Secondary | ICD-10-CM

## 2017-05-28 DIAGNOSIS — Z23 Encounter for immunization: Secondary | ICD-10-CM | POA: Diagnosis not present

## 2017-05-28 DIAGNOSIS — I10 Essential (primary) hypertension: Secondary | ICD-10-CM | POA: Diagnosis not present

## 2017-05-28 DIAGNOSIS — Z85038 Personal history of other malignant neoplasm of large intestine: Secondary | ICD-10-CM | POA: Diagnosis not present

## 2017-05-28 LAB — CBC WITH DIFFERENTIAL/PLATELET
BASO%: 0.5 % (ref 0.0–2.0)
Basophils Absolute: 0 10*3/uL (ref 0.0–0.1)
EOS ABS: 0 10*3/uL (ref 0.0–0.5)
EOS%: 0.1 % (ref 0.0–7.0)
HCT: 32.2 % — ABNORMAL LOW (ref 38.4–49.9)
HGB: 10.8 g/dL — ABNORMAL LOW (ref 13.0–17.1)
LYMPH%: 11.6 % — AB (ref 14.0–49.0)
MCH: 29.4 pg (ref 27.2–33.4)
MCHC: 33.5 g/dL (ref 32.0–36.0)
MCV: 87.6 fL (ref 79.3–98.0)
MONO#: 1.5 10*3/uL — ABNORMAL HIGH (ref 0.1–0.9)
MONO%: 38 % — AB (ref 0.0–14.0)
NEUT%: 49.8 % (ref 39.0–75.0)
NEUTROS ABS: 1.9 10*3/uL (ref 1.5–6.5)
PLATELETS: 41 10*3/uL — AB (ref 140–400)
RBC: 3.68 10*6/uL — AB (ref 4.20–5.82)
RDW: 16.8 % — ABNORMAL HIGH (ref 11.0–14.6)
WBC: 3.9 10*3/uL — AB (ref 4.0–10.3)
lymph#: 0.5 10*3/uL — ABNORMAL LOW (ref 0.9–3.3)

## 2017-05-28 MED ORDER — INFLUENZA VAC SPLIT HIGH-DOSE 0.5 ML IM SUSY
0.5000 mL | PREFILLED_SYRINGE | INTRAMUSCULAR | Status: AC
  Administered 2017-05-28: 0.5 mL via INTRAMUSCULAR
  Filled 2017-05-28: qty 0.5

## 2017-05-28 NOTE — Progress Notes (Addendum)
  [place] OFFICE PROGRESS NOTE   Diagnosis:  Myelodysplasia  INTERVAL HISTORY:   [name] returns as scheduled. No interim illnesses or infections. He denies any bleeding. He notes easy bruising. He continues to have a good appetite. No falls.  Objective:  Vital signs in last 24 hours:  Blood pressure (!) 126/58, pulse 70, temperature 98.6 F (37 C), temperature source Oral, resp. rate 17, height '5\' 11"'$  (1.803 m), weight 189 lb 4.8 oz (85.9 kg), SpO2 98 %.    HEENT: No thrush or ulcers. Lymphatics: No palpable cervical or supra-clavicular lymph nodes. Resp: Distant breath sounds. No respiratory distress. Cardio: Regular rate and rhythm. GI: No hepatomegaly. Vascular: No leg edema. Skin: A few bruises on the forearms.    Lab Results:  Lab Results  Component Value Date   WBC 3.9 (L) 05/28/2017   HGB 10.8 (L) 05/28/2017   HCT 32.2 (L) 05/28/2017   MCV 87.6 05/28/2017   PLT 41 (L) 05/28/2017   NEUTROABS 1.9 05/28/2017    Imaging:  No results found.  Medications: I have reviewed the patient's current medications.  Assessment/Plan: 1. Stage III colon cancer diagnosed in August 2008, status post adjuvant Xeloda chemotherapy, completed in April 2009. He underwent a colonoscopy 11/16/2011 with multiple polyps. 2. History of increased tearing, status post right tear duct stent placement.  3. History of multiple colonic polyps, status post a negative colonoscopy by Dr. Benson Norway in May 2011.  4. Anxiety disorder.  5. Multiple back surgeries.  6. Hypertension.  7. Gastroesophageal reflux disease, status post a Nissen fundoplication.  8. Macular degeneration, followed by Dr. Zadie Rhine.  9. Right ear "tinnitus," and hearing loss followed by Dr. Ernesto Rutherford.  10. Severe microcytic anemia. Ferritin returned low at 7 on 11/13/2011. He was transfused 2 units of blood. Bone marrow biopsy on 11/28/2011 confirmed decreased iron stores. The hemoglobin normalized. He  continues oral iron. 11. Hemoccult positive stool. He underwent an upper endoscopy on 11/16/2011 with findings of moderate gastritis and question atypical duodenal AVMs. There was no evidence of active bleeding. Colonoscopy also on 11/16/2011 showed multiple polyps, hemorrhoids and diverticula. 12. Thrombocytopenia. Stable. 13. Mild leukopenia. Stable. 14. Mildly elevated LDH 11/13/2011. 15. Mildly elevated PT 11/13/2011. 16. 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 There was no evidence of metastatic carcinoma. Storage iron was decreased. Cytogenetic returned with a normal 53 XY karyotype. A molecular FISH panel was negative. Restaging bone marrow 02/18/2016 showed hypercellular bone marrow for age with persistent myelodysplastic state. Blast cell count slightly higher at 16%. Storage iron present. Cytogenetic analysis was normal.   Disposition: Mr. Stuckey remains stable from a hematologic standpoint. He will return for labs and a follow-up visit in 3 months. He will receive the influenza vaccine today.  Patient seen with Dr. Benay Spice.  Ned Card ANP/GNP-BC   05/28/2017  11:29 AM  This was a shared visit with Ned Card. Mr. Fedewa was interviewed and examined. He appears stable. The plan is to continue observation for the myelodysplastic syndrome.  Julieanne Manson, M.D.

## 2017-05-28 NOTE — Telephone Encounter (Signed)
Scheduled appt per 10/8 los - Gave patient AVS and calender per los.  

## 2017-05-29 DIAGNOSIS — N183 Chronic kidney disease, stage 3 (moderate): Secondary | ICD-10-CM | POA: Diagnosis not present

## 2017-05-29 DIAGNOSIS — F411 Generalized anxiety disorder: Secondary | ICD-10-CM | POA: Diagnosis not present

## 2017-05-29 DIAGNOSIS — D469 Myelodysplastic syndrome, unspecified: Secondary | ICD-10-CM | POA: Diagnosis not present

## 2017-05-29 DIAGNOSIS — D61818 Other pancytopenia: Secondary | ICD-10-CM | POA: Diagnosis not present

## 2017-05-29 DIAGNOSIS — C189 Malignant neoplasm of colon, unspecified: Secondary | ICD-10-CM | POA: Diagnosis not present

## 2017-05-29 DIAGNOSIS — I10 Essential (primary) hypertension: Secondary | ICD-10-CM | POA: Diagnosis not present

## 2017-05-30 DIAGNOSIS — N183 Chronic kidney disease, stage 3 (moderate): Secondary | ICD-10-CM | POA: Diagnosis not present

## 2017-05-30 DIAGNOSIS — I10 Essential (primary) hypertension: Secondary | ICD-10-CM | POA: Diagnosis not present

## 2017-05-30 DIAGNOSIS — D61818 Other pancytopenia: Secondary | ICD-10-CM | POA: Diagnosis not present

## 2017-05-30 DIAGNOSIS — C189 Malignant neoplasm of colon, unspecified: Secondary | ICD-10-CM | POA: Diagnosis not present

## 2017-05-30 DIAGNOSIS — D469 Myelodysplastic syndrome, unspecified: Secondary | ICD-10-CM | POA: Diagnosis not present

## 2017-05-30 DIAGNOSIS — F411 Generalized anxiety disorder: Secondary | ICD-10-CM | POA: Diagnosis not present

## 2017-06-01 DIAGNOSIS — D61818 Other pancytopenia: Secondary | ICD-10-CM | POA: Diagnosis not present

## 2017-06-01 DIAGNOSIS — C189 Malignant neoplasm of colon, unspecified: Secondary | ICD-10-CM | POA: Diagnosis not present

## 2017-06-01 DIAGNOSIS — I10 Essential (primary) hypertension: Secondary | ICD-10-CM | POA: Diagnosis not present

## 2017-06-01 DIAGNOSIS — N183 Chronic kidney disease, stage 3 (moderate): Secondary | ICD-10-CM | POA: Diagnosis not present

## 2017-06-01 DIAGNOSIS — F411 Generalized anxiety disorder: Secondary | ICD-10-CM | POA: Diagnosis not present

## 2017-06-01 DIAGNOSIS — D469 Myelodysplastic syndrome, unspecified: Secondary | ICD-10-CM | POA: Diagnosis not present

## 2017-06-05 DIAGNOSIS — C189 Malignant neoplasm of colon, unspecified: Secondary | ICD-10-CM | POA: Diagnosis not present

## 2017-06-05 DIAGNOSIS — D61818 Other pancytopenia: Secondary | ICD-10-CM | POA: Diagnosis not present

## 2017-06-05 DIAGNOSIS — N183 Chronic kidney disease, stage 3 (moderate): Secondary | ICD-10-CM | POA: Diagnosis not present

## 2017-06-05 DIAGNOSIS — F411 Generalized anxiety disorder: Secondary | ICD-10-CM | POA: Diagnosis not present

## 2017-06-05 DIAGNOSIS — D469 Myelodysplastic syndrome, unspecified: Secondary | ICD-10-CM | POA: Diagnosis not present

## 2017-06-05 DIAGNOSIS — I10 Essential (primary) hypertension: Secondary | ICD-10-CM | POA: Diagnosis not present

## 2017-06-06 DIAGNOSIS — N183 Chronic kidney disease, stage 3 (moderate): Secondary | ICD-10-CM | POA: Diagnosis not present

## 2017-06-06 DIAGNOSIS — C189 Malignant neoplasm of colon, unspecified: Secondary | ICD-10-CM | POA: Diagnosis not present

## 2017-06-06 DIAGNOSIS — I10 Essential (primary) hypertension: Secondary | ICD-10-CM | POA: Diagnosis not present

## 2017-06-06 DIAGNOSIS — D61818 Other pancytopenia: Secondary | ICD-10-CM | POA: Diagnosis not present

## 2017-06-06 DIAGNOSIS — D469 Myelodysplastic syndrome, unspecified: Secondary | ICD-10-CM | POA: Diagnosis not present

## 2017-06-06 DIAGNOSIS — F411 Generalized anxiety disorder: Secondary | ICD-10-CM | POA: Diagnosis not present

## 2017-06-12 DIAGNOSIS — I10 Essential (primary) hypertension: Secondary | ICD-10-CM | POA: Diagnosis not present

## 2017-06-12 DIAGNOSIS — D61818 Other pancytopenia: Secondary | ICD-10-CM | POA: Diagnosis not present

## 2017-06-12 DIAGNOSIS — C189 Malignant neoplasm of colon, unspecified: Secondary | ICD-10-CM | POA: Diagnosis not present

## 2017-06-12 DIAGNOSIS — D469 Myelodysplastic syndrome, unspecified: Secondary | ICD-10-CM | POA: Diagnosis not present

## 2017-06-12 DIAGNOSIS — F411 Generalized anxiety disorder: Secondary | ICD-10-CM | POA: Diagnosis not present

## 2017-06-12 DIAGNOSIS — N183 Chronic kidney disease, stage 3 (moderate): Secondary | ICD-10-CM | POA: Diagnosis not present

## 2017-06-13 DIAGNOSIS — D469 Myelodysplastic syndrome, unspecified: Secondary | ICD-10-CM | POA: Diagnosis not present

## 2017-06-13 DIAGNOSIS — C189 Malignant neoplasm of colon, unspecified: Secondary | ICD-10-CM | POA: Diagnosis not present

## 2017-06-13 DIAGNOSIS — I10 Essential (primary) hypertension: Secondary | ICD-10-CM | POA: Diagnosis not present

## 2017-06-13 DIAGNOSIS — F411 Generalized anxiety disorder: Secondary | ICD-10-CM | POA: Diagnosis not present

## 2017-06-13 DIAGNOSIS — D61818 Other pancytopenia: Secondary | ICD-10-CM | POA: Diagnosis not present

## 2017-06-13 DIAGNOSIS — N183 Chronic kidney disease, stage 3 (moderate): Secondary | ICD-10-CM | POA: Diagnosis not present

## 2017-06-20 DIAGNOSIS — N183 Chronic kidney disease, stage 3 (moderate): Secondary | ICD-10-CM | POA: Diagnosis not present

## 2017-06-20 DIAGNOSIS — I10 Essential (primary) hypertension: Secondary | ICD-10-CM | POA: Diagnosis not present

## 2017-06-20 DIAGNOSIS — C189 Malignant neoplasm of colon, unspecified: Secondary | ICD-10-CM | POA: Diagnosis not present

## 2017-06-20 DIAGNOSIS — D61818 Other pancytopenia: Secondary | ICD-10-CM | POA: Diagnosis not present

## 2017-06-20 DIAGNOSIS — F411 Generalized anxiety disorder: Secondary | ICD-10-CM | POA: Diagnosis not present

## 2017-06-20 DIAGNOSIS — D469 Myelodysplastic syndrome, unspecified: Secondary | ICD-10-CM | POA: Diagnosis not present

## 2017-06-21 ENCOUNTER — Other Ambulatory Visit: Payer: Self-pay | Admitting: Oncology

## 2017-06-21 DIAGNOSIS — K219 Gastro-esophageal reflux disease without esophagitis: Secondary | ICD-10-CM | POA: Diagnosis not present

## 2017-06-21 DIAGNOSIS — N183 Chronic kidney disease, stage 3 (moderate): Secondary | ICD-10-CM | POA: Diagnosis not present

## 2017-06-21 DIAGNOSIS — H9319 Tinnitus, unspecified ear: Secondary | ICD-10-CM | POA: Diagnosis not present

## 2017-06-21 DIAGNOSIS — L209 Atopic dermatitis, unspecified: Secondary | ICD-10-CM | POA: Diagnosis not present

## 2017-06-21 DIAGNOSIS — D61818 Other pancytopenia: Secondary | ICD-10-CM | POA: Diagnosis not present

## 2017-06-21 DIAGNOSIS — C189 Malignant neoplasm of colon, unspecified: Secondary | ICD-10-CM | POA: Diagnosis not present

## 2017-06-21 DIAGNOSIS — D469 Myelodysplastic syndrome, unspecified: Secondary | ICD-10-CM | POA: Diagnosis not present

## 2017-06-21 DIAGNOSIS — J309 Allergic rhinitis, unspecified: Secondary | ICD-10-CM | POA: Diagnosis not present

## 2017-06-21 DIAGNOSIS — H3530 Unspecified macular degeneration: Secondary | ICD-10-CM | POA: Diagnosis not present

## 2017-06-21 DIAGNOSIS — F411 Generalized anxiety disorder: Secondary | ICD-10-CM | POA: Diagnosis not present

## 2017-06-21 DIAGNOSIS — I10 Essential (primary) hypertension: Secondary | ICD-10-CM | POA: Diagnosis not present

## 2017-06-26 DIAGNOSIS — F411 Generalized anxiety disorder: Secondary | ICD-10-CM | POA: Diagnosis not present

## 2017-06-26 DIAGNOSIS — C189 Malignant neoplasm of colon, unspecified: Secondary | ICD-10-CM | POA: Diagnosis not present

## 2017-06-26 DIAGNOSIS — N183 Chronic kidney disease, stage 3 (moderate): Secondary | ICD-10-CM | POA: Diagnosis not present

## 2017-06-26 DIAGNOSIS — D469 Myelodysplastic syndrome, unspecified: Secondary | ICD-10-CM | POA: Diagnosis not present

## 2017-06-26 DIAGNOSIS — D61818 Other pancytopenia: Secondary | ICD-10-CM | POA: Diagnosis not present

## 2017-06-26 DIAGNOSIS — I10 Essential (primary) hypertension: Secondary | ICD-10-CM | POA: Diagnosis not present

## 2017-06-27 DIAGNOSIS — D469 Myelodysplastic syndrome, unspecified: Secondary | ICD-10-CM | POA: Diagnosis not present

## 2017-06-27 DIAGNOSIS — I10 Essential (primary) hypertension: Secondary | ICD-10-CM | POA: Diagnosis not present

## 2017-06-27 DIAGNOSIS — F411 Generalized anxiety disorder: Secondary | ICD-10-CM | POA: Diagnosis not present

## 2017-06-27 DIAGNOSIS — D61818 Other pancytopenia: Secondary | ICD-10-CM | POA: Diagnosis not present

## 2017-06-27 DIAGNOSIS — N183 Chronic kidney disease, stage 3 (moderate): Secondary | ICD-10-CM | POA: Diagnosis not present

## 2017-06-27 DIAGNOSIS — C189 Malignant neoplasm of colon, unspecified: Secondary | ICD-10-CM | POA: Diagnosis not present

## 2017-07-02 DIAGNOSIS — N183 Chronic kidney disease, stage 3 (moderate): Secondary | ICD-10-CM | POA: Diagnosis not present

## 2017-07-02 DIAGNOSIS — F411 Generalized anxiety disorder: Secondary | ICD-10-CM | POA: Diagnosis not present

## 2017-07-02 DIAGNOSIS — C189 Malignant neoplasm of colon, unspecified: Secondary | ICD-10-CM | POA: Diagnosis not present

## 2017-07-02 DIAGNOSIS — I10 Essential (primary) hypertension: Secondary | ICD-10-CM | POA: Diagnosis not present

## 2017-07-02 DIAGNOSIS — D61818 Other pancytopenia: Secondary | ICD-10-CM | POA: Diagnosis not present

## 2017-07-02 DIAGNOSIS — D469 Myelodysplastic syndrome, unspecified: Secondary | ICD-10-CM | POA: Diagnosis not present

## 2017-07-03 DIAGNOSIS — N183 Chronic kidney disease, stage 3 (moderate): Secondary | ICD-10-CM | POA: Diagnosis not present

## 2017-07-03 DIAGNOSIS — D61818 Other pancytopenia: Secondary | ICD-10-CM | POA: Diagnosis not present

## 2017-07-03 DIAGNOSIS — I10 Essential (primary) hypertension: Secondary | ICD-10-CM | POA: Diagnosis not present

## 2017-07-03 DIAGNOSIS — F411 Generalized anxiety disorder: Secondary | ICD-10-CM | POA: Diagnosis not present

## 2017-07-03 DIAGNOSIS — D469 Myelodysplastic syndrome, unspecified: Secondary | ICD-10-CM | POA: Diagnosis not present

## 2017-07-03 DIAGNOSIS — C189 Malignant neoplasm of colon, unspecified: Secondary | ICD-10-CM | POA: Diagnosis not present

## 2017-07-04 DIAGNOSIS — I10 Essential (primary) hypertension: Secondary | ICD-10-CM | POA: Diagnosis not present

## 2017-07-04 DIAGNOSIS — D61818 Other pancytopenia: Secondary | ICD-10-CM | POA: Diagnosis not present

## 2017-07-04 DIAGNOSIS — C189 Malignant neoplasm of colon, unspecified: Secondary | ICD-10-CM | POA: Diagnosis not present

## 2017-07-04 DIAGNOSIS — N183 Chronic kidney disease, stage 3 (moderate): Secondary | ICD-10-CM | POA: Diagnosis not present

## 2017-07-04 DIAGNOSIS — D469 Myelodysplastic syndrome, unspecified: Secondary | ICD-10-CM | POA: Diagnosis not present

## 2017-07-04 DIAGNOSIS — F411 Generalized anxiety disorder: Secondary | ICD-10-CM | POA: Diagnosis not present

## 2017-07-10 DIAGNOSIS — I10 Essential (primary) hypertension: Secondary | ICD-10-CM | POA: Diagnosis not present

## 2017-07-10 DIAGNOSIS — F411 Generalized anxiety disorder: Secondary | ICD-10-CM | POA: Diagnosis not present

## 2017-07-10 DIAGNOSIS — D469 Myelodysplastic syndrome, unspecified: Secondary | ICD-10-CM | POA: Diagnosis not present

## 2017-07-10 DIAGNOSIS — D61818 Other pancytopenia: Secondary | ICD-10-CM | POA: Diagnosis not present

## 2017-07-10 DIAGNOSIS — N183 Chronic kidney disease, stage 3 (moderate): Secondary | ICD-10-CM | POA: Diagnosis not present

## 2017-07-10 DIAGNOSIS — C189 Malignant neoplasm of colon, unspecified: Secondary | ICD-10-CM | POA: Diagnosis not present

## 2017-07-11 DIAGNOSIS — D469 Myelodysplastic syndrome, unspecified: Secondary | ICD-10-CM | POA: Diagnosis not present

## 2017-07-11 DIAGNOSIS — I10 Essential (primary) hypertension: Secondary | ICD-10-CM | POA: Diagnosis not present

## 2017-07-11 DIAGNOSIS — C189 Malignant neoplasm of colon, unspecified: Secondary | ICD-10-CM | POA: Diagnosis not present

## 2017-07-11 DIAGNOSIS — F411 Generalized anxiety disorder: Secondary | ICD-10-CM | POA: Diagnosis not present

## 2017-07-11 DIAGNOSIS — N183 Chronic kidney disease, stage 3 (moderate): Secondary | ICD-10-CM | POA: Diagnosis not present

## 2017-07-11 DIAGNOSIS — D61818 Other pancytopenia: Secondary | ICD-10-CM | POA: Diagnosis not present

## 2017-07-17 DIAGNOSIS — F411 Generalized anxiety disorder: Secondary | ICD-10-CM | POA: Diagnosis not present

## 2017-07-17 DIAGNOSIS — C189 Malignant neoplasm of colon, unspecified: Secondary | ICD-10-CM | POA: Diagnosis not present

## 2017-07-17 DIAGNOSIS — D61818 Other pancytopenia: Secondary | ICD-10-CM | POA: Diagnosis not present

## 2017-07-17 DIAGNOSIS — D469 Myelodysplastic syndrome, unspecified: Secondary | ICD-10-CM | POA: Diagnosis not present

## 2017-07-17 DIAGNOSIS — N183 Chronic kidney disease, stage 3 (moderate): Secondary | ICD-10-CM | POA: Diagnosis not present

## 2017-07-17 DIAGNOSIS — I10 Essential (primary) hypertension: Secondary | ICD-10-CM | POA: Diagnosis not present

## 2017-07-18 DIAGNOSIS — N183 Chronic kidney disease, stage 3 (moderate): Secondary | ICD-10-CM | POA: Diagnosis not present

## 2017-07-18 DIAGNOSIS — I10 Essential (primary) hypertension: Secondary | ICD-10-CM | POA: Diagnosis not present

## 2017-07-18 DIAGNOSIS — C189 Malignant neoplasm of colon, unspecified: Secondary | ICD-10-CM | POA: Diagnosis not present

## 2017-07-18 DIAGNOSIS — F411 Generalized anxiety disorder: Secondary | ICD-10-CM | POA: Diagnosis not present

## 2017-07-18 DIAGNOSIS — D61818 Other pancytopenia: Secondary | ICD-10-CM | POA: Diagnosis not present

## 2017-07-18 DIAGNOSIS — D469 Myelodysplastic syndrome, unspecified: Secondary | ICD-10-CM | POA: Diagnosis not present

## 2017-07-21 DIAGNOSIS — J309 Allergic rhinitis, unspecified: Secondary | ICD-10-CM | POA: Diagnosis not present

## 2017-07-21 DIAGNOSIS — N183 Chronic kidney disease, stage 3 (moderate): Secondary | ICD-10-CM | POA: Diagnosis not present

## 2017-07-21 DIAGNOSIS — C189 Malignant neoplasm of colon, unspecified: Secondary | ICD-10-CM | POA: Diagnosis not present

## 2017-07-21 DIAGNOSIS — L209 Atopic dermatitis, unspecified: Secondary | ICD-10-CM | POA: Diagnosis not present

## 2017-07-21 DIAGNOSIS — K219 Gastro-esophageal reflux disease without esophagitis: Secondary | ICD-10-CM | POA: Diagnosis not present

## 2017-07-21 DIAGNOSIS — D61818 Other pancytopenia: Secondary | ICD-10-CM | POA: Diagnosis not present

## 2017-07-21 DIAGNOSIS — I10 Essential (primary) hypertension: Secondary | ICD-10-CM | POA: Diagnosis not present

## 2017-07-21 DIAGNOSIS — H3530 Unspecified macular degeneration: Secondary | ICD-10-CM | POA: Diagnosis not present

## 2017-07-21 DIAGNOSIS — D469 Myelodysplastic syndrome, unspecified: Secondary | ICD-10-CM | POA: Diagnosis not present

## 2017-07-21 DIAGNOSIS — F411 Generalized anxiety disorder: Secondary | ICD-10-CM | POA: Diagnosis not present

## 2017-07-21 DIAGNOSIS — H9319 Tinnitus, unspecified ear: Secondary | ICD-10-CM | POA: Diagnosis not present

## 2017-07-24 ENCOUNTER — Other Ambulatory Visit: Payer: Self-pay | Admitting: Oncology

## 2017-07-24 DIAGNOSIS — N183 Chronic kidney disease, stage 3 (moderate): Secondary | ICD-10-CM | POA: Diagnosis not present

## 2017-07-24 DIAGNOSIS — I10 Essential (primary) hypertension: Secondary | ICD-10-CM | POA: Diagnosis not present

## 2017-07-24 DIAGNOSIS — D61818 Other pancytopenia: Secondary | ICD-10-CM | POA: Diagnosis not present

## 2017-07-24 DIAGNOSIS — D469 Myelodysplastic syndrome, unspecified: Secondary | ICD-10-CM

## 2017-07-24 DIAGNOSIS — F411 Generalized anxiety disorder: Secondary | ICD-10-CM | POA: Diagnosis not present

## 2017-07-24 DIAGNOSIS — C189 Malignant neoplasm of colon, unspecified: Secondary | ICD-10-CM | POA: Diagnosis not present

## 2017-07-25 DIAGNOSIS — N183 Chronic kidney disease, stage 3 (moderate): Secondary | ICD-10-CM | POA: Diagnosis not present

## 2017-07-25 DIAGNOSIS — C189 Malignant neoplasm of colon, unspecified: Secondary | ICD-10-CM | POA: Diagnosis not present

## 2017-07-25 DIAGNOSIS — D61818 Other pancytopenia: Secondary | ICD-10-CM | POA: Diagnosis not present

## 2017-07-25 DIAGNOSIS — I10 Essential (primary) hypertension: Secondary | ICD-10-CM | POA: Diagnosis not present

## 2017-07-25 DIAGNOSIS — D469 Myelodysplastic syndrome, unspecified: Secondary | ICD-10-CM | POA: Diagnosis not present

## 2017-07-25 DIAGNOSIS — F411 Generalized anxiety disorder: Secondary | ICD-10-CM | POA: Diagnosis not present

## 2017-07-27 DIAGNOSIS — N183 Chronic kidney disease, stage 3 (moderate): Secondary | ICD-10-CM | POA: Diagnosis not present

## 2017-07-27 DIAGNOSIS — F411 Generalized anxiety disorder: Secondary | ICD-10-CM | POA: Diagnosis not present

## 2017-07-27 DIAGNOSIS — D469 Myelodysplastic syndrome, unspecified: Secondary | ICD-10-CM | POA: Diagnosis not present

## 2017-07-27 DIAGNOSIS — I10 Essential (primary) hypertension: Secondary | ICD-10-CM | POA: Diagnosis not present

## 2017-07-27 DIAGNOSIS — D61818 Other pancytopenia: Secondary | ICD-10-CM | POA: Diagnosis not present

## 2017-07-27 DIAGNOSIS — C189 Malignant neoplasm of colon, unspecified: Secondary | ICD-10-CM | POA: Diagnosis not present

## 2017-08-01 DIAGNOSIS — C189 Malignant neoplasm of colon, unspecified: Secondary | ICD-10-CM | POA: Diagnosis not present

## 2017-08-01 DIAGNOSIS — D469 Myelodysplastic syndrome, unspecified: Secondary | ICD-10-CM | POA: Diagnosis not present

## 2017-08-01 DIAGNOSIS — D61818 Other pancytopenia: Secondary | ICD-10-CM | POA: Diagnosis not present

## 2017-08-01 DIAGNOSIS — I10 Essential (primary) hypertension: Secondary | ICD-10-CM | POA: Diagnosis not present

## 2017-08-01 DIAGNOSIS — N183 Chronic kidney disease, stage 3 (moderate): Secondary | ICD-10-CM | POA: Diagnosis not present

## 2017-08-01 DIAGNOSIS — F411 Generalized anxiety disorder: Secondary | ICD-10-CM | POA: Diagnosis not present

## 2017-08-03 DIAGNOSIS — D61818 Other pancytopenia: Secondary | ICD-10-CM | POA: Diagnosis not present

## 2017-08-03 DIAGNOSIS — I10 Essential (primary) hypertension: Secondary | ICD-10-CM | POA: Diagnosis not present

## 2017-08-03 DIAGNOSIS — D469 Myelodysplastic syndrome, unspecified: Secondary | ICD-10-CM | POA: Diagnosis not present

## 2017-08-03 DIAGNOSIS — N183 Chronic kidney disease, stage 3 (moderate): Secondary | ICD-10-CM | POA: Diagnosis not present

## 2017-08-03 DIAGNOSIS — F411 Generalized anxiety disorder: Secondary | ICD-10-CM | POA: Diagnosis not present

## 2017-08-03 DIAGNOSIS — C189 Malignant neoplasm of colon, unspecified: Secondary | ICD-10-CM | POA: Diagnosis not present

## 2017-08-07 DIAGNOSIS — F411 Generalized anxiety disorder: Secondary | ICD-10-CM | POA: Diagnosis not present

## 2017-08-07 DIAGNOSIS — N183 Chronic kidney disease, stage 3 (moderate): Secondary | ICD-10-CM | POA: Diagnosis not present

## 2017-08-07 DIAGNOSIS — D61818 Other pancytopenia: Secondary | ICD-10-CM | POA: Diagnosis not present

## 2017-08-07 DIAGNOSIS — D469 Myelodysplastic syndrome, unspecified: Secondary | ICD-10-CM | POA: Diagnosis not present

## 2017-08-07 DIAGNOSIS — C189 Malignant neoplasm of colon, unspecified: Secondary | ICD-10-CM | POA: Diagnosis not present

## 2017-08-07 DIAGNOSIS — I10 Essential (primary) hypertension: Secondary | ICD-10-CM | POA: Diagnosis not present

## 2017-08-08 DIAGNOSIS — I10 Essential (primary) hypertension: Secondary | ICD-10-CM | POA: Diagnosis not present

## 2017-08-08 DIAGNOSIS — C189 Malignant neoplasm of colon, unspecified: Secondary | ICD-10-CM | POA: Diagnosis not present

## 2017-08-08 DIAGNOSIS — D469 Myelodysplastic syndrome, unspecified: Secondary | ICD-10-CM | POA: Diagnosis not present

## 2017-08-08 DIAGNOSIS — D61818 Other pancytopenia: Secondary | ICD-10-CM | POA: Diagnosis not present

## 2017-08-08 DIAGNOSIS — F411 Generalized anxiety disorder: Secondary | ICD-10-CM | POA: Diagnosis not present

## 2017-08-08 DIAGNOSIS — N183 Chronic kidney disease, stage 3 (moderate): Secondary | ICD-10-CM | POA: Diagnosis not present

## 2017-08-15 DIAGNOSIS — D61818 Other pancytopenia: Secondary | ICD-10-CM | POA: Diagnosis not present

## 2017-08-15 DIAGNOSIS — I10 Essential (primary) hypertension: Secondary | ICD-10-CM | POA: Diagnosis not present

## 2017-08-15 DIAGNOSIS — N183 Chronic kidney disease, stage 3 (moderate): Secondary | ICD-10-CM | POA: Diagnosis not present

## 2017-08-15 DIAGNOSIS — F411 Generalized anxiety disorder: Secondary | ICD-10-CM | POA: Diagnosis not present

## 2017-08-15 DIAGNOSIS — C189 Malignant neoplasm of colon, unspecified: Secondary | ICD-10-CM | POA: Diagnosis not present

## 2017-08-15 DIAGNOSIS — D469 Myelodysplastic syndrome, unspecified: Secondary | ICD-10-CM | POA: Diagnosis not present

## 2017-08-16 DIAGNOSIS — N183 Chronic kidney disease, stage 3 (moderate): Secondary | ICD-10-CM | POA: Diagnosis not present

## 2017-08-16 DIAGNOSIS — C189 Malignant neoplasm of colon, unspecified: Secondary | ICD-10-CM | POA: Diagnosis not present

## 2017-08-16 DIAGNOSIS — D61818 Other pancytopenia: Secondary | ICD-10-CM | POA: Diagnosis not present

## 2017-08-16 DIAGNOSIS — F411 Generalized anxiety disorder: Secondary | ICD-10-CM | POA: Diagnosis not present

## 2017-08-16 DIAGNOSIS — I10 Essential (primary) hypertension: Secondary | ICD-10-CM | POA: Diagnosis not present

## 2017-08-16 DIAGNOSIS — D469 Myelodysplastic syndrome, unspecified: Secondary | ICD-10-CM | POA: Diagnosis not present

## 2017-08-21 DIAGNOSIS — J309 Allergic rhinitis, unspecified: Secondary | ICD-10-CM | POA: Diagnosis not present

## 2017-08-21 DIAGNOSIS — F411 Generalized anxiety disorder: Secondary | ICD-10-CM | POA: Diagnosis not present

## 2017-08-21 DIAGNOSIS — N183 Chronic kidney disease, stage 3 (moderate): Secondary | ICD-10-CM | POA: Diagnosis not present

## 2017-08-21 DIAGNOSIS — K219 Gastro-esophageal reflux disease without esophagitis: Secondary | ICD-10-CM | POA: Diagnosis not present

## 2017-08-21 DIAGNOSIS — H3530 Unspecified macular degeneration: Secondary | ICD-10-CM | POA: Diagnosis not present

## 2017-08-21 DIAGNOSIS — I10 Essential (primary) hypertension: Secondary | ICD-10-CM | POA: Diagnosis not present

## 2017-08-21 DIAGNOSIS — D469 Myelodysplastic syndrome, unspecified: Secondary | ICD-10-CM | POA: Diagnosis not present

## 2017-08-21 DIAGNOSIS — L209 Atopic dermatitis, unspecified: Secondary | ICD-10-CM | POA: Diagnosis not present

## 2017-08-21 DIAGNOSIS — H9319 Tinnitus, unspecified ear: Secondary | ICD-10-CM | POA: Diagnosis not present

## 2017-08-21 DIAGNOSIS — C189 Malignant neoplasm of colon, unspecified: Secondary | ICD-10-CM | POA: Diagnosis not present

## 2017-08-21 DIAGNOSIS — D61818 Other pancytopenia: Secondary | ICD-10-CM | POA: Diagnosis not present

## 2017-08-22 DIAGNOSIS — F411 Generalized anxiety disorder: Secondary | ICD-10-CM | POA: Diagnosis not present

## 2017-08-22 DIAGNOSIS — N183 Chronic kidney disease, stage 3 (moderate): Secondary | ICD-10-CM | POA: Diagnosis not present

## 2017-08-22 DIAGNOSIS — D61818 Other pancytopenia: Secondary | ICD-10-CM | POA: Diagnosis not present

## 2017-08-22 DIAGNOSIS — I10 Essential (primary) hypertension: Secondary | ICD-10-CM | POA: Diagnosis not present

## 2017-08-22 DIAGNOSIS — C189 Malignant neoplasm of colon, unspecified: Secondary | ICD-10-CM | POA: Diagnosis not present

## 2017-08-22 DIAGNOSIS — D469 Myelodysplastic syndrome, unspecified: Secondary | ICD-10-CM | POA: Diagnosis not present

## 2017-08-23 DIAGNOSIS — I10 Essential (primary) hypertension: Secondary | ICD-10-CM | POA: Diagnosis not present

## 2017-08-23 DIAGNOSIS — F411 Generalized anxiety disorder: Secondary | ICD-10-CM | POA: Diagnosis not present

## 2017-08-23 DIAGNOSIS — D469 Myelodysplastic syndrome, unspecified: Secondary | ICD-10-CM | POA: Diagnosis not present

## 2017-08-23 DIAGNOSIS — C189 Malignant neoplasm of colon, unspecified: Secondary | ICD-10-CM | POA: Diagnosis not present

## 2017-08-23 DIAGNOSIS — D61818 Other pancytopenia: Secondary | ICD-10-CM | POA: Diagnosis not present

## 2017-08-23 DIAGNOSIS — N183 Chronic kidney disease, stage 3 (moderate): Secondary | ICD-10-CM | POA: Diagnosis not present

## 2017-08-27 ENCOUNTER — Inpatient Hospital Stay

## 2017-08-27 ENCOUNTER — Inpatient Hospital Stay: Attending: Oncology | Admitting: Oncology

## 2017-08-27 ENCOUNTER — Telehealth: Payer: Self-pay | Admitting: Oncology

## 2017-08-27 VITALS — BP 133/65 | HR 62 | Temp 97.6°F | Resp 16 | Ht 71.0 in | Wt 193.2 lb

## 2017-08-27 DIAGNOSIS — Z85038 Personal history of other malignant neoplasm of large intestine: Secondary | ICD-10-CM | POA: Diagnosis not present

## 2017-08-27 DIAGNOSIS — D469 Myelodysplastic syndrome, unspecified: Secondary | ICD-10-CM | POA: Insufficient documentation

## 2017-08-27 DIAGNOSIS — I10 Essential (primary) hypertension: Secondary | ICD-10-CM | POA: Diagnosis not present

## 2017-08-27 LAB — CBC WITH DIFFERENTIAL/PLATELET
Abs Granulocyte: 1.6 10*3/uL (ref 1.5–6.5)
BASOS PCT: 0 %
Basophils Absolute: 0 10*3/uL (ref 0.0–0.1)
EOS ABS: 0 10*3/uL (ref 0.0–0.5)
EOS PCT: 0 %
HCT: 32.5 % — ABNORMAL LOW (ref 38.4–49.9)
Hemoglobin: 11 g/dL — ABNORMAL LOW (ref 13.0–17.1)
Lymphocytes Relative: 18 %
Lymphs Abs: 0.7 10*3/uL — ABNORMAL LOW (ref 0.9–3.3)
MCH: 30.1 pg (ref 27.2–33.4)
MCHC: 33.8 g/dL (ref 32.0–36.0)
MCV: 89 fL (ref 79.3–98.0)
MONO ABS: 1.5 10*3/uL — AB (ref 0.1–0.9)
MONOS PCT: 40 %
NEUTROS ABS: 1.6 10*3/uL (ref 1.5–6.5)
Neutrophils Relative %: 42 %
PLATELETS: 37 10*3/uL — AB (ref 140–400)
RBC: 3.65 MIL/uL — ABNORMAL LOW (ref 4.20–5.82)
RDW: 16.3 % — AB (ref 11.0–15.6)
WBC: 3.7 10*3/uL — ABNORMAL LOW (ref 4.0–10.3)

## 2017-08-27 MED ORDER — METOPROLOL SUCCINATE ER 25 MG PO TB24: 25.0000 mg | ORAL_TABLET | Freq: Every day | ORAL | 5 refills | Status: DC

## 2017-08-27 MED ORDER — AMLODIPINE BESYLATE 10 MG PO TABS: 10.0000 mg | ORAL_TABLET | Freq: Every day | ORAL | 5 refills | Status: DC

## 2017-08-27 MED ORDER — TRAZODONE HCL 300 MG PO TABS: 300.0000 mg | ORAL_TABLET | Freq: Every day | ORAL | 5 refills | Status: DC

## 2017-08-27 MED ORDER — LORAZEPAM 2 MG PO TABS: ORAL_TABLET | ORAL | 5 refills | Status: DC

## 2017-08-27 MED ORDER — TRAMADOL HCL 50 MG PO TABS: 50.0000 mg | ORAL_TABLET | Freq: Four times a day (QID) | ORAL | 5 refills | Status: DC | PRN

## 2017-08-27 NOTE — Telephone Encounter (Signed)
Scheduled appt per 1/7 los - Gave patient AVS And calender per los. Lab and f/u in 3 months.

## 2017-08-27 NOTE — Progress Notes (Signed)
Ativan and Tramadol refills faxed to patient pharmacy.

## 2017-08-27 NOTE — Progress Notes (Signed)
  [place] OFFICE PROGRESS NOTE   Diagnosis: Myelodysplasia  INTERVAL HISTORY:   [name] returns as scheduled.  He denies bleeding and infections.  No new complaint.  The hospice RN continues to visit him.  Objective:  Vital signs in last 24 hours:  There were no vitals taken for this visit.    HEENT: No thrush or bleeding Resp: Lungs clear bilaterally Cardio: Regular rate and rhythm GI: No hepatosplenomegaly Vascular: No leg edema   Lab Results:  Lab Results  Component Value Date   WBC 3.7 (L) 08/27/2017   HGB 11.0 (L) 08/27/2017   HCT 32.5 (L) 08/27/2017   MCV 89.0 08/27/2017   PLT 37 (L) 08/27/2017   NEUTROABS 1.6 08/27/2017    Medications: I have reviewed the patient's current medications.   Assessment/Plan: 1. Stage III colon cancer diagnosed in August 2008, status post adjuvant Xeloda chemotherapy, completed in April 2009. He underwent a colonoscopy 11/16/2011 with multiple polyps. 2. History of increased tearing, status post right tear duct stent placement.  3. History of multiple colonic polyps, status post a negative colonoscopy by Dr. Benson Norway in May 2011.  4. Anxiety disorder.  5. Multiple back surgeries.  6. Hypertension.  7. Gastroesophageal reflux disease, status post a Nissen fundoplication.  8. Macular degeneration, followed by Dr. Zadie Rhine.  9. Right ear "tinnitus," and hearing loss followed by Dr. Ernesto Rutherford.  10. Severe microcytic anemia. Ferritin returned low at 7 on 11/13/2011. He was transfused 2 units of blood. Bone marrow biopsy on 11/28/2011 confirmed decreased iron stores. The hemoglobin normalized. He continues oral iron. 11. Hemoccult positive stool. He underwent an upper endoscopy on 11/16/2011 with findings of moderate gastritis and question atypical duodenal AVMs. There was no evidence of active bleeding. Colonoscopy also on 11/16/2011 showed multiple polyps, hemorrhoids and diverticula. 12. Thrombocytopenia.  Stable. 13. Mild leukopenia. Stable. 14. Mildly elevated LDH 11/13/2011. 15. Mildly elevated PT 11/13/2011. 16. 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 There was no evidence of metastatic carcinoma. Storage iron was decreased. Cytogenetic returned with a normal 50 XY karyotype. A molecular FISH panel was negative. Restaging bone marrow 02/18/2016 showed hypercellular bone marrow for age with persistent myelodysplastic state. Blast cell count slightly higher at 16%. Storage iron present. Cytogenetic analysis was normal.  Disposition: [name] appears unchanged.  He is stable from a hematologic standpoint.  He will call for a fever or bleeding.  He will return for an office and lab visit in 3 months.  15 minutes were spent with the patient today.  The majority of the time was used for counseling and coordination of care.  Betsy Coder, MD  08/27/2017  10:49 AM

## 2017-08-28 DIAGNOSIS — F411 Generalized anxiety disorder: Secondary | ICD-10-CM | POA: Diagnosis not present

## 2017-08-28 DIAGNOSIS — I10 Essential (primary) hypertension: Secondary | ICD-10-CM | POA: Diagnosis not present

## 2017-08-28 DIAGNOSIS — D61818 Other pancytopenia: Secondary | ICD-10-CM | POA: Diagnosis not present

## 2017-08-28 DIAGNOSIS — C189 Malignant neoplasm of colon, unspecified: Secondary | ICD-10-CM | POA: Diagnosis not present

## 2017-08-28 DIAGNOSIS — D469 Myelodysplastic syndrome, unspecified: Secondary | ICD-10-CM | POA: Diagnosis not present

## 2017-08-28 DIAGNOSIS — N183 Chronic kidney disease, stage 3 (moderate): Secondary | ICD-10-CM | POA: Diagnosis not present

## 2017-08-29 DIAGNOSIS — D469 Myelodysplastic syndrome, unspecified: Secondary | ICD-10-CM | POA: Diagnosis not present

## 2017-08-29 DIAGNOSIS — N183 Chronic kidney disease, stage 3 (moderate): Secondary | ICD-10-CM | POA: Diagnosis not present

## 2017-08-29 DIAGNOSIS — I10 Essential (primary) hypertension: Secondary | ICD-10-CM | POA: Diagnosis not present

## 2017-08-29 DIAGNOSIS — D61818 Other pancytopenia: Secondary | ICD-10-CM | POA: Diagnosis not present

## 2017-08-29 DIAGNOSIS — F411 Generalized anxiety disorder: Secondary | ICD-10-CM | POA: Diagnosis not present

## 2017-08-29 DIAGNOSIS — C189 Malignant neoplasm of colon, unspecified: Secondary | ICD-10-CM | POA: Diagnosis not present

## 2017-09-04 DIAGNOSIS — D469 Myelodysplastic syndrome, unspecified: Secondary | ICD-10-CM | POA: Diagnosis not present

## 2017-09-04 DIAGNOSIS — N183 Chronic kidney disease, stage 3 (moderate): Secondary | ICD-10-CM | POA: Diagnosis not present

## 2017-09-04 DIAGNOSIS — I10 Essential (primary) hypertension: Secondary | ICD-10-CM | POA: Diagnosis not present

## 2017-09-04 DIAGNOSIS — D61818 Other pancytopenia: Secondary | ICD-10-CM | POA: Diagnosis not present

## 2017-09-04 DIAGNOSIS — C189 Malignant neoplasm of colon, unspecified: Secondary | ICD-10-CM | POA: Diagnosis not present

## 2017-09-04 DIAGNOSIS — F411 Generalized anxiety disorder: Secondary | ICD-10-CM | POA: Diagnosis not present

## 2017-09-05 DIAGNOSIS — N183 Chronic kidney disease, stage 3 (moderate): Secondary | ICD-10-CM | POA: Diagnosis not present

## 2017-09-05 DIAGNOSIS — F411 Generalized anxiety disorder: Secondary | ICD-10-CM | POA: Diagnosis not present

## 2017-09-05 DIAGNOSIS — D61818 Other pancytopenia: Secondary | ICD-10-CM | POA: Diagnosis not present

## 2017-09-05 DIAGNOSIS — D469 Myelodysplastic syndrome, unspecified: Secondary | ICD-10-CM | POA: Diagnosis not present

## 2017-09-05 DIAGNOSIS — C189 Malignant neoplasm of colon, unspecified: Secondary | ICD-10-CM | POA: Diagnosis not present

## 2017-09-05 DIAGNOSIS — I10 Essential (primary) hypertension: Secondary | ICD-10-CM | POA: Diagnosis not present

## 2017-09-07 DIAGNOSIS — I10 Essential (primary) hypertension: Secondary | ICD-10-CM | POA: Diagnosis not present

## 2017-09-07 DIAGNOSIS — F411 Generalized anxiety disorder: Secondary | ICD-10-CM | POA: Diagnosis not present

## 2017-09-07 DIAGNOSIS — C189 Malignant neoplasm of colon, unspecified: Secondary | ICD-10-CM | POA: Diagnosis not present

## 2017-09-07 DIAGNOSIS — D61818 Other pancytopenia: Secondary | ICD-10-CM | POA: Diagnosis not present

## 2017-09-07 DIAGNOSIS — D469 Myelodysplastic syndrome, unspecified: Secondary | ICD-10-CM | POA: Diagnosis not present

## 2017-09-07 DIAGNOSIS — N183 Chronic kidney disease, stage 3 (moderate): Secondary | ICD-10-CM | POA: Diagnosis not present

## 2017-09-11 DIAGNOSIS — C189 Malignant neoplasm of colon, unspecified: Secondary | ICD-10-CM | POA: Diagnosis not present

## 2017-09-11 DIAGNOSIS — D469 Myelodysplastic syndrome, unspecified: Secondary | ICD-10-CM | POA: Diagnosis not present

## 2017-09-11 DIAGNOSIS — I10 Essential (primary) hypertension: Secondary | ICD-10-CM | POA: Diagnosis not present

## 2017-09-11 DIAGNOSIS — D61818 Other pancytopenia: Secondary | ICD-10-CM | POA: Diagnosis not present

## 2017-09-11 DIAGNOSIS — N183 Chronic kidney disease, stage 3 (moderate): Secondary | ICD-10-CM | POA: Diagnosis not present

## 2017-09-11 DIAGNOSIS — F411 Generalized anxiety disorder: Secondary | ICD-10-CM | POA: Diagnosis not present

## 2017-09-12 DIAGNOSIS — I10 Essential (primary) hypertension: Secondary | ICD-10-CM | POA: Diagnosis not present

## 2017-09-12 DIAGNOSIS — F411 Generalized anxiety disorder: Secondary | ICD-10-CM | POA: Diagnosis not present

## 2017-09-12 DIAGNOSIS — D469 Myelodysplastic syndrome, unspecified: Secondary | ICD-10-CM | POA: Diagnosis not present

## 2017-09-12 DIAGNOSIS — N183 Chronic kidney disease, stage 3 (moderate): Secondary | ICD-10-CM | POA: Diagnosis not present

## 2017-09-12 DIAGNOSIS — D61818 Other pancytopenia: Secondary | ICD-10-CM | POA: Diagnosis not present

## 2017-09-12 DIAGNOSIS — C189 Malignant neoplasm of colon, unspecified: Secondary | ICD-10-CM | POA: Diagnosis not present

## 2017-09-18 DIAGNOSIS — F411 Generalized anxiety disorder: Secondary | ICD-10-CM | POA: Diagnosis not present

## 2017-09-18 DIAGNOSIS — N183 Chronic kidney disease, stage 3 (moderate): Secondary | ICD-10-CM | POA: Diagnosis not present

## 2017-09-18 DIAGNOSIS — C189 Malignant neoplasm of colon, unspecified: Secondary | ICD-10-CM | POA: Diagnosis not present

## 2017-09-18 DIAGNOSIS — D61818 Other pancytopenia: Secondary | ICD-10-CM | POA: Diagnosis not present

## 2017-09-18 DIAGNOSIS — I10 Essential (primary) hypertension: Secondary | ICD-10-CM | POA: Diagnosis not present

## 2017-09-18 DIAGNOSIS — D469 Myelodysplastic syndrome, unspecified: Secondary | ICD-10-CM | POA: Diagnosis not present

## 2017-09-19 DIAGNOSIS — F411 Generalized anxiety disorder: Secondary | ICD-10-CM | POA: Diagnosis not present

## 2017-09-19 DIAGNOSIS — I10 Essential (primary) hypertension: Secondary | ICD-10-CM | POA: Diagnosis not present

## 2017-09-19 DIAGNOSIS — C189 Malignant neoplasm of colon, unspecified: Secondary | ICD-10-CM | POA: Diagnosis not present

## 2017-09-19 DIAGNOSIS — N183 Chronic kidney disease, stage 3 (moderate): Secondary | ICD-10-CM | POA: Diagnosis not present

## 2017-09-19 DIAGNOSIS — D469 Myelodysplastic syndrome, unspecified: Secondary | ICD-10-CM | POA: Diagnosis not present

## 2017-09-19 DIAGNOSIS — D61818 Other pancytopenia: Secondary | ICD-10-CM | POA: Diagnosis not present

## 2017-09-21 DIAGNOSIS — D469 Myelodysplastic syndrome, unspecified: Secondary | ICD-10-CM | POA: Diagnosis not present

## 2017-09-21 DIAGNOSIS — F411 Generalized anxiety disorder: Secondary | ICD-10-CM | POA: Diagnosis not present

## 2017-09-21 DIAGNOSIS — D61818 Other pancytopenia: Secondary | ICD-10-CM | POA: Diagnosis not present

## 2017-09-21 DIAGNOSIS — H3530 Unspecified macular degeneration: Secondary | ICD-10-CM | POA: Diagnosis not present

## 2017-09-21 DIAGNOSIS — L209 Atopic dermatitis, unspecified: Secondary | ICD-10-CM | POA: Diagnosis not present

## 2017-09-21 DIAGNOSIS — J309 Allergic rhinitis, unspecified: Secondary | ICD-10-CM | POA: Diagnosis not present

## 2017-09-21 DIAGNOSIS — N183 Chronic kidney disease, stage 3 (moderate): Secondary | ICD-10-CM | POA: Diagnosis not present

## 2017-09-21 DIAGNOSIS — H9319 Tinnitus, unspecified ear: Secondary | ICD-10-CM | POA: Diagnosis not present

## 2017-09-21 DIAGNOSIS — C189 Malignant neoplasm of colon, unspecified: Secondary | ICD-10-CM | POA: Diagnosis not present

## 2017-09-21 DIAGNOSIS — K219 Gastro-esophageal reflux disease without esophagitis: Secondary | ICD-10-CM | POA: Diagnosis not present

## 2017-09-21 DIAGNOSIS — I10 Essential (primary) hypertension: Secondary | ICD-10-CM | POA: Diagnosis not present

## 2017-09-25 DIAGNOSIS — F411 Generalized anxiety disorder: Secondary | ICD-10-CM | POA: Diagnosis not present

## 2017-09-25 DIAGNOSIS — D61818 Other pancytopenia: Secondary | ICD-10-CM | POA: Diagnosis not present

## 2017-09-25 DIAGNOSIS — I10 Essential (primary) hypertension: Secondary | ICD-10-CM | POA: Diagnosis not present

## 2017-09-25 DIAGNOSIS — C189 Malignant neoplasm of colon, unspecified: Secondary | ICD-10-CM | POA: Diagnosis not present

## 2017-09-25 DIAGNOSIS — D469 Myelodysplastic syndrome, unspecified: Secondary | ICD-10-CM | POA: Diagnosis not present

## 2017-09-25 DIAGNOSIS — N183 Chronic kidney disease, stage 3 (moderate): Secondary | ICD-10-CM | POA: Diagnosis not present

## 2017-09-26 DIAGNOSIS — F411 Generalized anxiety disorder: Secondary | ICD-10-CM | POA: Diagnosis not present

## 2017-09-26 DIAGNOSIS — D61818 Other pancytopenia: Secondary | ICD-10-CM | POA: Diagnosis not present

## 2017-09-26 DIAGNOSIS — C189 Malignant neoplasm of colon, unspecified: Secondary | ICD-10-CM | POA: Diagnosis not present

## 2017-09-26 DIAGNOSIS — D469 Myelodysplastic syndrome, unspecified: Secondary | ICD-10-CM | POA: Diagnosis not present

## 2017-09-26 DIAGNOSIS — N183 Chronic kidney disease, stage 3 (moderate): Secondary | ICD-10-CM | POA: Diagnosis not present

## 2017-09-26 DIAGNOSIS — I10 Essential (primary) hypertension: Secondary | ICD-10-CM | POA: Diagnosis not present

## 2017-10-02 DIAGNOSIS — F411 Generalized anxiety disorder: Secondary | ICD-10-CM | POA: Diagnosis not present

## 2017-10-02 DIAGNOSIS — N183 Chronic kidney disease, stage 3 (moderate): Secondary | ICD-10-CM | POA: Diagnosis not present

## 2017-10-02 DIAGNOSIS — D61818 Other pancytopenia: Secondary | ICD-10-CM | POA: Diagnosis not present

## 2017-10-02 DIAGNOSIS — D469 Myelodysplastic syndrome, unspecified: Secondary | ICD-10-CM | POA: Diagnosis not present

## 2017-10-02 DIAGNOSIS — C189 Malignant neoplasm of colon, unspecified: Secondary | ICD-10-CM | POA: Diagnosis not present

## 2017-10-02 DIAGNOSIS — I10 Essential (primary) hypertension: Secondary | ICD-10-CM | POA: Diagnosis not present

## 2017-10-03 DIAGNOSIS — D469 Myelodysplastic syndrome, unspecified: Secondary | ICD-10-CM | POA: Diagnosis not present

## 2017-10-03 DIAGNOSIS — F411 Generalized anxiety disorder: Secondary | ICD-10-CM | POA: Diagnosis not present

## 2017-10-03 DIAGNOSIS — D61818 Other pancytopenia: Secondary | ICD-10-CM | POA: Diagnosis not present

## 2017-10-03 DIAGNOSIS — N183 Chronic kidney disease, stage 3 (moderate): Secondary | ICD-10-CM | POA: Diagnosis not present

## 2017-10-03 DIAGNOSIS — C189 Malignant neoplasm of colon, unspecified: Secondary | ICD-10-CM | POA: Diagnosis not present

## 2017-10-03 DIAGNOSIS — I10 Essential (primary) hypertension: Secondary | ICD-10-CM | POA: Diagnosis not present

## 2017-10-08 DIAGNOSIS — I10 Essential (primary) hypertension: Secondary | ICD-10-CM | POA: Diagnosis not present

## 2017-10-08 DIAGNOSIS — D61818 Other pancytopenia: Secondary | ICD-10-CM | POA: Diagnosis not present

## 2017-10-08 DIAGNOSIS — D469 Myelodysplastic syndrome, unspecified: Secondary | ICD-10-CM | POA: Diagnosis not present

## 2017-10-08 DIAGNOSIS — F411 Generalized anxiety disorder: Secondary | ICD-10-CM | POA: Diagnosis not present

## 2017-10-08 DIAGNOSIS — C189 Malignant neoplasm of colon, unspecified: Secondary | ICD-10-CM | POA: Diagnosis not present

## 2017-10-08 DIAGNOSIS — N183 Chronic kidney disease, stage 3 (moderate): Secondary | ICD-10-CM | POA: Diagnosis not present

## 2017-10-09 DIAGNOSIS — D469 Myelodysplastic syndrome, unspecified: Secondary | ICD-10-CM | POA: Diagnosis not present

## 2017-10-09 DIAGNOSIS — N183 Chronic kidney disease, stage 3 (moderate): Secondary | ICD-10-CM | POA: Diagnosis not present

## 2017-10-09 DIAGNOSIS — C189 Malignant neoplasm of colon, unspecified: Secondary | ICD-10-CM | POA: Diagnosis not present

## 2017-10-09 DIAGNOSIS — D61818 Other pancytopenia: Secondary | ICD-10-CM | POA: Diagnosis not present

## 2017-10-09 DIAGNOSIS — I10 Essential (primary) hypertension: Secondary | ICD-10-CM | POA: Diagnosis not present

## 2017-10-09 DIAGNOSIS — F411 Generalized anxiety disorder: Secondary | ICD-10-CM | POA: Diagnosis not present

## 2017-10-10 DIAGNOSIS — F411 Generalized anxiety disorder: Secondary | ICD-10-CM | POA: Diagnosis not present

## 2017-10-10 DIAGNOSIS — N183 Chronic kidney disease, stage 3 (moderate): Secondary | ICD-10-CM | POA: Diagnosis not present

## 2017-10-10 DIAGNOSIS — C189 Malignant neoplasm of colon, unspecified: Secondary | ICD-10-CM | POA: Diagnosis not present

## 2017-10-10 DIAGNOSIS — I10 Essential (primary) hypertension: Secondary | ICD-10-CM | POA: Diagnosis not present

## 2017-10-10 DIAGNOSIS — D61818 Other pancytopenia: Secondary | ICD-10-CM | POA: Diagnosis not present

## 2017-10-10 DIAGNOSIS — D469 Myelodysplastic syndrome, unspecified: Secondary | ICD-10-CM | POA: Diagnosis not present

## 2017-10-15 DIAGNOSIS — I10 Essential (primary) hypertension: Secondary | ICD-10-CM | POA: Diagnosis not present

## 2017-10-15 DIAGNOSIS — D469 Myelodysplastic syndrome, unspecified: Secondary | ICD-10-CM | POA: Diagnosis not present

## 2017-10-15 DIAGNOSIS — F411 Generalized anxiety disorder: Secondary | ICD-10-CM | POA: Diagnosis not present

## 2017-10-15 DIAGNOSIS — D61818 Other pancytopenia: Secondary | ICD-10-CM | POA: Diagnosis not present

## 2017-10-15 DIAGNOSIS — N183 Chronic kidney disease, stage 3 (moderate): Secondary | ICD-10-CM | POA: Diagnosis not present

## 2017-10-15 DIAGNOSIS — C189 Malignant neoplasm of colon, unspecified: Secondary | ICD-10-CM | POA: Diagnosis not present

## 2017-10-17 DIAGNOSIS — F411 Generalized anxiety disorder: Secondary | ICD-10-CM | POA: Diagnosis not present

## 2017-10-17 DIAGNOSIS — D469 Myelodysplastic syndrome, unspecified: Secondary | ICD-10-CM | POA: Diagnosis not present

## 2017-10-17 DIAGNOSIS — I10 Essential (primary) hypertension: Secondary | ICD-10-CM | POA: Diagnosis not present

## 2017-10-17 DIAGNOSIS — D61818 Other pancytopenia: Secondary | ICD-10-CM | POA: Diagnosis not present

## 2017-10-17 DIAGNOSIS — C189 Malignant neoplasm of colon, unspecified: Secondary | ICD-10-CM | POA: Diagnosis not present

## 2017-10-17 DIAGNOSIS — N183 Chronic kidney disease, stage 3 (moderate): Secondary | ICD-10-CM | POA: Diagnosis not present

## 2017-10-19 DIAGNOSIS — H9319 Tinnitus, unspecified ear: Secondary | ICD-10-CM | POA: Diagnosis not present

## 2017-10-19 DIAGNOSIS — J309 Allergic rhinitis, unspecified: Secondary | ICD-10-CM | POA: Diagnosis not present

## 2017-10-19 DIAGNOSIS — F411 Generalized anxiety disorder: Secondary | ICD-10-CM | POA: Diagnosis not present

## 2017-10-19 DIAGNOSIS — D469 Myelodysplastic syndrome, unspecified: Secondary | ICD-10-CM | POA: Diagnosis not present

## 2017-10-19 DIAGNOSIS — I10 Essential (primary) hypertension: Secondary | ICD-10-CM | POA: Diagnosis not present

## 2017-10-19 DIAGNOSIS — N183 Chronic kidney disease, stage 3 (moderate): Secondary | ICD-10-CM | POA: Diagnosis not present

## 2017-10-19 DIAGNOSIS — C189 Malignant neoplasm of colon, unspecified: Secondary | ICD-10-CM | POA: Diagnosis not present

## 2017-10-19 DIAGNOSIS — H3530 Unspecified macular degeneration: Secondary | ICD-10-CM | POA: Diagnosis not present

## 2017-10-19 DIAGNOSIS — L209 Atopic dermatitis, unspecified: Secondary | ICD-10-CM | POA: Diagnosis not present

## 2017-10-19 DIAGNOSIS — D61818 Other pancytopenia: Secondary | ICD-10-CM | POA: Diagnosis not present

## 2017-10-19 DIAGNOSIS — K219 Gastro-esophageal reflux disease without esophagitis: Secondary | ICD-10-CM | POA: Diagnosis not present

## 2017-10-22 DIAGNOSIS — D469 Myelodysplastic syndrome, unspecified: Secondary | ICD-10-CM | POA: Diagnosis not present

## 2017-10-22 DIAGNOSIS — F411 Generalized anxiety disorder: Secondary | ICD-10-CM | POA: Diagnosis not present

## 2017-10-22 DIAGNOSIS — C189 Malignant neoplasm of colon, unspecified: Secondary | ICD-10-CM | POA: Diagnosis not present

## 2017-10-22 DIAGNOSIS — I10 Essential (primary) hypertension: Secondary | ICD-10-CM | POA: Diagnosis not present

## 2017-10-22 DIAGNOSIS — N183 Chronic kidney disease, stage 3 (moderate): Secondary | ICD-10-CM | POA: Diagnosis not present

## 2017-10-22 DIAGNOSIS — D61818 Other pancytopenia: Secondary | ICD-10-CM | POA: Diagnosis not present

## 2017-10-23 ENCOUNTER — Telehealth: Payer: Self-pay | Admitting: *Deleted

## 2017-10-23 DIAGNOSIS — D61818 Other pancytopenia: Secondary | ICD-10-CM | POA: Diagnosis not present

## 2017-10-23 DIAGNOSIS — D469 Myelodysplastic syndrome, unspecified: Secondary | ICD-10-CM | POA: Diagnosis not present

## 2017-10-23 DIAGNOSIS — N183 Chronic kidney disease, stage 3 (moderate): Secondary | ICD-10-CM | POA: Diagnosis not present

## 2017-10-23 DIAGNOSIS — I10 Essential (primary) hypertension: Secondary | ICD-10-CM | POA: Diagnosis not present

## 2017-10-23 DIAGNOSIS — C189 Malignant neoplasm of colon, unspecified: Secondary | ICD-10-CM | POA: Diagnosis not present

## 2017-10-23 DIAGNOSIS — F411 Generalized anxiety disorder: Secondary | ICD-10-CM | POA: Diagnosis not present

## 2017-10-23 NOTE — Telephone Encounter (Signed)
Message from [place] requesting refills maintenance medications. Confirmed with pt's pharmacy he has several refills remaining on his medications. Almyra Free made aware.

## 2017-10-24 ENCOUNTER — Other Ambulatory Visit: Payer: Self-pay | Admitting: Oncology

## 2017-10-24 DIAGNOSIS — D469 Myelodysplastic syndrome, unspecified: Secondary | ICD-10-CM | POA: Diagnosis not present

## 2017-10-24 DIAGNOSIS — I10 Essential (primary) hypertension: Secondary | ICD-10-CM | POA: Diagnosis not present

## 2017-10-24 DIAGNOSIS — D61818 Other pancytopenia: Secondary | ICD-10-CM | POA: Diagnosis not present

## 2017-10-24 DIAGNOSIS — C189 Malignant neoplasm of colon, unspecified: Secondary | ICD-10-CM | POA: Diagnosis not present

## 2017-10-24 DIAGNOSIS — F411 Generalized anxiety disorder: Secondary | ICD-10-CM | POA: Diagnosis not present

## 2017-10-24 DIAGNOSIS — N183 Chronic kidney disease, stage 3 (moderate): Secondary | ICD-10-CM | POA: Diagnosis not present

## 2017-10-29 DIAGNOSIS — C189 Malignant neoplasm of colon, unspecified: Secondary | ICD-10-CM | POA: Diagnosis not present

## 2017-10-29 DIAGNOSIS — D61818 Other pancytopenia: Secondary | ICD-10-CM | POA: Diagnosis not present

## 2017-10-29 DIAGNOSIS — F411 Generalized anxiety disorder: Secondary | ICD-10-CM | POA: Diagnosis not present

## 2017-10-29 DIAGNOSIS — D469 Myelodysplastic syndrome, unspecified: Secondary | ICD-10-CM | POA: Diagnosis not present

## 2017-10-29 DIAGNOSIS — N183 Chronic kidney disease, stage 3 (moderate): Secondary | ICD-10-CM | POA: Diagnosis not present

## 2017-10-29 DIAGNOSIS — I10 Essential (primary) hypertension: Secondary | ICD-10-CM | POA: Diagnosis not present

## 2017-10-30 DIAGNOSIS — I10 Essential (primary) hypertension: Secondary | ICD-10-CM | POA: Diagnosis not present

## 2017-10-30 DIAGNOSIS — D469 Myelodysplastic syndrome, unspecified: Secondary | ICD-10-CM | POA: Diagnosis not present

## 2017-10-30 DIAGNOSIS — F411 Generalized anxiety disorder: Secondary | ICD-10-CM | POA: Diagnosis not present

## 2017-10-30 DIAGNOSIS — D61818 Other pancytopenia: Secondary | ICD-10-CM | POA: Diagnosis not present

## 2017-10-30 DIAGNOSIS — N183 Chronic kidney disease, stage 3 (moderate): Secondary | ICD-10-CM | POA: Diagnosis not present

## 2017-10-30 DIAGNOSIS — C189 Malignant neoplasm of colon, unspecified: Secondary | ICD-10-CM | POA: Diagnosis not present

## 2017-10-31 DIAGNOSIS — N183 Chronic kidney disease, stage 3 (moderate): Secondary | ICD-10-CM | POA: Diagnosis not present

## 2017-10-31 DIAGNOSIS — I10 Essential (primary) hypertension: Secondary | ICD-10-CM | POA: Diagnosis not present

## 2017-10-31 DIAGNOSIS — D469 Myelodysplastic syndrome, unspecified: Secondary | ICD-10-CM | POA: Diagnosis not present

## 2017-10-31 DIAGNOSIS — D61818 Other pancytopenia: Secondary | ICD-10-CM | POA: Diagnosis not present

## 2017-10-31 DIAGNOSIS — C189 Malignant neoplasm of colon, unspecified: Secondary | ICD-10-CM | POA: Diagnosis not present

## 2017-10-31 DIAGNOSIS — F411 Generalized anxiety disorder: Secondary | ICD-10-CM | POA: Diagnosis not present

## 2017-11-05 DIAGNOSIS — F411 Generalized anxiety disorder: Secondary | ICD-10-CM | POA: Diagnosis not present

## 2017-11-05 DIAGNOSIS — C189 Malignant neoplasm of colon, unspecified: Secondary | ICD-10-CM | POA: Diagnosis not present

## 2017-11-05 DIAGNOSIS — D469 Myelodysplastic syndrome, unspecified: Secondary | ICD-10-CM | POA: Diagnosis not present

## 2017-11-05 DIAGNOSIS — D61818 Other pancytopenia: Secondary | ICD-10-CM | POA: Diagnosis not present

## 2017-11-05 DIAGNOSIS — N183 Chronic kidney disease, stage 3 (moderate): Secondary | ICD-10-CM | POA: Diagnosis not present

## 2017-11-05 DIAGNOSIS — I10 Essential (primary) hypertension: Secondary | ICD-10-CM | POA: Diagnosis not present

## 2017-11-06 DIAGNOSIS — F411 Generalized anxiety disorder: Secondary | ICD-10-CM | POA: Diagnosis not present

## 2017-11-06 DIAGNOSIS — I10 Essential (primary) hypertension: Secondary | ICD-10-CM | POA: Diagnosis not present

## 2017-11-06 DIAGNOSIS — N183 Chronic kidney disease, stage 3 (moderate): Secondary | ICD-10-CM | POA: Diagnosis not present

## 2017-11-06 DIAGNOSIS — C189 Malignant neoplasm of colon, unspecified: Secondary | ICD-10-CM | POA: Diagnosis not present

## 2017-11-06 DIAGNOSIS — D61818 Other pancytopenia: Secondary | ICD-10-CM | POA: Diagnosis not present

## 2017-11-06 DIAGNOSIS — D469 Myelodysplastic syndrome, unspecified: Secondary | ICD-10-CM | POA: Diagnosis not present

## 2017-11-07 DIAGNOSIS — N183 Chronic kidney disease, stage 3 (moderate): Secondary | ICD-10-CM | POA: Diagnosis not present

## 2017-11-07 DIAGNOSIS — I10 Essential (primary) hypertension: Secondary | ICD-10-CM | POA: Diagnosis not present

## 2017-11-07 DIAGNOSIS — C189 Malignant neoplasm of colon, unspecified: Secondary | ICD-10-CM | POA: Diagnosis not present

## 2017-11-07 DIAGNOSIS — D61818 Other pancytopenia: Secondary | ICD-10-CM | POA: Diagnosis not present

## 2017-11-07 DIAGNOSIS — D469 Myelodysplastic syndrome, unspecified: Secondary | ICD-10-CM | POA: Diagnosis not present

## 2017-11-07 DIAGNOSIS — F411 Generalized anxiety disorder: Secondary | ICD-10-CM | POA: Diagnosis not present

## 2017-11-12 DIAGNOSIS — F411 Generalized anxiety disorder: Secondary | ICD-10-CM | POA: Diagnosis not present

## 2017-11-12 DIAGNOSIS — D469 Myelodysplastic syndrome, unspecified: Secondary | ICD-10-CM | POA: Diagnosis not present

## 2017-11-12 DIAGNOSIS — I10 Essential (primary) hypertension: Secondary | ICD-10-CM | POA: Diagnosis not present

## 2017-11-12 DIAGNOSIS — N183 Chronic kidney disease, stage 3 (moderate): Secondary | ICD-10-CM | POA: Diagnosis not present

## 2017-11-12 DIAGNOSIS — D61818 Other pancytopenia: Secondary | ICD-10-CM | POA: Diagnosis not present

## 2017-11-12 DIAGNOSIS — C189 Malignant neoplasm of colon, unspecified: Secondary | ICD-10-CM | POA: Diagnosis not present

## 2017-11-13 DIAGNOSIS — D61818 Other pancytopenia: Secondary | ICD-10-CM | POA: Diagnosis not present

## 2017-11-13 DIAGNOSIS — I10 Essential (primary) hypertension: Secondary | ICD-10-CM | POA: Diagnosis not present

## 2017-11-13 DIAGNOSIS — C189 Malignant neoplasm of colon, unspecified: Secondary | ICD-10-CM | POA: Diagnosis not present

## 2017-11-13 DIAGNOSIS — N183 Chronic kidney disease, stage 3 (moderate): Secondary | ICD-10-CM | POA: Diagnosis not present

## 2017-11-13 DIAGNOSIS — D469 Myelodysplastic syndrome, unspecified: Secondary | ICD-10-CM | POA: Diagnosis not present

## 2017-11-13 DIAGNOSIS — F411 Generalized anxiety disorder: Secondary | ICD-10-CM | POA: Diagnosis not present

## 2017-11-14 DIAGNOSIS — D469 Myelodysplastic syndrome, unspecified: Secondary | ICD-10-CM | POA: Diagnosis not present

## 2017-11-14 DIAGNOSIS — D61818 Other pancytopenia: Secondary | ICD-10-CM | POA: Diagnosis not present

## 2017-11-14 DIAGNOSIS — F411 Generalized anxiety disorder: Secondary | ICD-10-CM | POA: Diagnosis not present

## 2017-11-14 DIAGNOSIS — C189 Malignant neoplasm of colon, unspecified: Secondary | ICD-10-CM | POA: Diagnosis not present

## 2017-11-14 DIAGNOSIS — I10 Essential (primary) hypertension: Secondary | ICD-10-CM | POA: Diagnosis not present

## 2017-11-14 DIAGNOSIS — N183 Chronic kidney disease, stage 3 (moderate): Secondary | ICD-10-CM | POA: Diagnosis not present

## 2017-11-19 DIAGNOSIS — N183 Chronic kidney disease, stage 3 (moderate): Secondary | ICD-10-CM | POA: Diagnosis not present

## 2017-11-19 DIAGNOSIS — K219 Gastro-esophageal reflux disease without esophagitis: Secondary | ICD-10-CM | POA: Diagnosis not present

## 2017-11-19 DIAGNOSIS — D469 Myelodysplastic syndrome, unspecified: Secondary | ICD-10-CM | POA: Diagnosis not present

## 2017-11-19 DIAGNOSIS — F411 Generalized anxiety disorder: Secondary | ICD-10-CM | POA: Diagnosis not present

## 2017-11-19 DIAGNOSIS — H9319 Tinnitus, unspecified ear: Secondary | ICD-10-CM | POA: Diagnosis not present

## 2017-11-19 DIAGNOSIS — C189 Malignant neoplasm of colon, unspecified: Secondary | ICD-10-CM | POA: Diagnosis not present

## 2017-11-19 DIAGNOSIS — H3530 Unspecified macular degeneration: Secondary | ICD-10-CM | POA: Diagnosis not present

## 2017-11-19 DIAGNOSIS — I10 Essential (primary) hypertension: Secondary | ICD-10-CM | POA: Diagnosis not present

## 2017-11-19 DIAGNOSIS — D61818 Other pancytopenia: Secondary | ICD-10-CM | POA: Diagnosis not present

## 2017-11-19 DIAGNOSIS — J309 Allergic rhinitis, unspecified: Secondary | ICD-10-CM | POA: Diagnosis not present

## 2017-11-19 DIAGNOSIS — L209 Atopic dermatitis, unspecified: Secondary | ICD-10-CM | POA: Diagnosis not present

## 2017-11-20 ENCOUNTER — Other Ambulatory Visit: Payer: Self-pay | Admitting: Oncology

## 2017-11-20 DIAGNOSIS — N183 Chronic kidney disease, stage 3 (moderate): Secondary | ICD-10-CM | POA: Diagnosis not present

## 2017-11-20 DIAGNOSIS — D469 Myelodysplastic syndrome, unspecified: Secondary | ICD-10-CM

## 2017-11-20 DIAGNOSIS — F411 Generalized anxiety disorder: Secondary | ICD-10-CM | POA: Diagnosis not present

## 2017-11-20 DIAGNOSIS — I10 Essential (primary) hypertension: Secondary | ICD-10-CM | POA: Diagnosis not present

## 2017-11-20 DIAGNOSIS — C189 Malignant neoplasm of colon, unspecified: Secondary | ICD-10-CM | POA: Diagnosis not present

## 2017-11-20 DIAGNOSIS — D61818 Other pancytopenia: Secondary | ICD-10-CM | POA: Diagnosis not present

## 2017-11-21 DIAGNOSIS — D61818 Other pancytopenia: Secondary | ICD-10-CM | POA: Diagnosis not present

## 2017-11-21 DIAGNOSIS — D469 Myelodysplastic syndrome, unspecified: Secondary | ICD-10-CM | POA: Diagnosis not present

## 2017-11-21 DIAGNOSIS — I10 Essential (primary) hypertension: Secondary | ICD-10-CM | POA: Diagnosis not present

## 2017-11-21 DIAGNOSIS — C189 Malignant neoplasm of colon, unspecified: Secondary | ICD-10-CM | POA: Diagnosis not present

## 2017-11-21 DIAGNOSIS — N183 Chronic kidney disease, stage 3 (moderate): Secondary | ICD-10-CM | POA: Diagnosis not present

## 2017-11-21 DIAGNOSIS — F411 Generalized anxiety disorder: Secondary | ICD-10-CM | POA: Diagnosis not present

## 2017-11-26 ENCOUNTER — Inpatient Hospital Stay: Attending: Oncology | Admitting: Nurse Practitioner

## 2017-11-26 ENCOUNTER — Telehealth: Payer: Self-pay | Admitting: Oncology

## 2017-11-26 ENCOUNTER — Encounter: Payer: Self-pay | Admitting: Nurse Practitioner

## 2017-11-26 ENCOUNTER — Inpatient Hospital Stay

## 2017-11-26 VITALS — BP 131/61 | HR 66 | Temp 97.5°F | Resp 18 | Ht 71.0 in | Wt 193.9 lb

## 2017-11-26 DIAGNOSIS — D469 Myelodysplastic syndrome, unspecified: Secondary | ICD-10-CM | POA: Insufficient documentation

## 2017-11-26 DIAGNOSIS — I10 Essential (primary) hypertension: Secondary | ICD-10-CM | POA: Diagnosis not present

## 2017-11-26 DIAGNOSIS — D61818 Other pancytopenia: Secondary | ICD-10-CM | POA: Diagnosis not present

## 2017-11-26 DIAGNOSIS — Z85038 Personal history of other malignant neoplasm of large intestine: Secondary | ICD-10-CM | POA: Insufficient documentation

## 2017-11-26 DIAGNOSIS — N183 Chronic kidney disease, stage 3 (moderate): Secondary | ICD-10-CM | POA: Diagnosis not present

## 2017-11-26 DIAGNOSIS — C189 Malignant neoplasm of colon, unspecified: Secondary | ICD-10-CM | POA: Diagnosis not present

## 2017-11-26 DIAGNOSIS — F411 Generalized anxiety disorder: Secondary | ICD-10-CM | POA: Diagnosis not present

## 2017-11-26 LAB — CBC WITH DIFFERENTIAL (CANCER CENTER ONLY)
BASOS ABS: 0 10*3/uL (ref 0.0–0.1)
BASOS PCT: 0 %
EOS ABS: 0 10*3/uL (ref 0.0–0.5)
EOS PCT: 0 %
HCT: 33.5 % — ABNORMAL LOW (ref 38.4–49.9)
Hemoglobin: 11 g/dL — ABNORMAL LOW (ref 13.0–17.1)
Lymphocytes Relative: 15 %
Lymphs Abs: 0.7 10*3/uL — ABNORMAL LOW (ref 0.9–3.3)
MCH: 29.8 pg (ref 27.2–33.4)
MCHC: 32.8 g/dL (ref 32.0–36.0)
MCV: 90.8 fL (ref 79.3–98.0)
MONO ABS: 1.6 10*3/uL — AB (ref 0.1–0.9)
Monocytes Relative: 34 %
Neutro Abs: 2.4 10*3/uL (ref 1.5–6.5)
Neutrophils Relative %: 51 %
PLATELETS: 34 10*3/uL — AB (ref 140–400)
RBC: 3.69 MIL/uL — ABNORMAL LOW (ref 4.20–5.82)
RDW: 16.3 % — AB (ref 11.0–14.6)
WBC Count: 4.7 10*3/uL (ref 4.0–10.3)

## 2017-11-26 NOTE — Telephone Encounter (Signed)
Appointments scheduled AVS/Calendar printed per 4/8 los

## 2017-11-26 NOTE — Progress Notes (Signed)
  [place] OFFICE PROGRESS NOTE   Diagnosis: Myelodysplasia  INTERVAL HISTORY:   Mr. Stankey returns as scheduled.  No interim illnesses or infections.  He has occasional nosebleeds.  No other bleeding.  He ambulates with a walker at home.  He describes his appetite as "great".  Objective:  Vital signs in last 24 hours:  Blood pressure 131/61, pulse 66, temperature (!) 97.5 F (36.4 C), temperature source Oral, resp. rate 18, height '5\' 11"'$  (1.803 m), weight 193 lb 14.4 oz (88 kg), SpO2 100 %.    HEENT: No thrush or ulcers. Resp: Lungs clear bilaterally. Cardio: Regular rate and rhythm. GI: Abdomen soft and nontender.  No hepatosplenomegaly. Vascular: No leg edema. Skin: Small ecchymosis right forearm.   Lab Results:  Lab Results  Component Value Date   WBC 4.7 11/26/2017   HGB 11.0 (L) 08/27/2017   HCT 33.5 (L) 11/26/2017   MCV 90.8 11/26/2017   PLT 34 (L) 11/26/2017   NEUTROABS 2.4 11/26/2017    Imaging:  No results found.  Medications: I have reviewed the patient's current medications.  Assessment/Plan: 1. Stage III colon cancer diagnosed in August 2008, status post adjuvant Xeloda chemotherapy, completed in April 2009. He underwent a colonoscopy 11/16/2011 with multiple polyps. 2. History of increased tearing, status post right tear duct stent placement.  3. History of multiple colonic polyps, status post a negative colonoscopy by Dr. Benson Norway in May 2011.  4. Anxiety disorder.  5. Multiple back surgeries.  6. Hypertension.  7. Gastroesophageal reflux disease, status post a Nissen fundoplication.  8. Macular degeneration, followed by Dr. Zadie Rhine.  9. Right ear "tinnitus," and hearing loss followed by Dr. Ernesto Rutherford.  10. Severe microcytic anemia. Ferritin returned low at 7 on 11/13/2011. He was transfused 2 units of blood. Bone marrow biopsy on 11/28/2011 confirmed decreased iron stores. The hemoglobin normalized. He continues oral  iron. 11. Hemoccult positive stool. He underwent an upper endoscopy on 11/16/2011 with findings of moderate gastritis and question atypical duodenal AVMs. There was no evidence of active bleeding. Colonoscopy also on 11/16/2011 showed multiple polyps, hemorrhoids and diverticula. 12. Thrombocytopenia. Stable. 13. Mild leukopenia. Stable. 14. Mildly elevated LDH 11/13/2011. 15. Mildly elevated PT 11/13/2011. 16. 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 There was no evidence of metastatic carcinoma. Storage iron was decreased. Cytogenetic returned with a normal 6 XY karyotype. A molecular FISH panel was negative. Restaging bone marrow 02/18/2016 showed hypercellular bone marrow for age with persistent myelodysplastic state. Blast cell count slightly higher at 16%. Storage iron present. Cytogenetic analysis was normal.     Disposition: Mr. Rathbun appears unchanged.  We reviewed the CBC from today.  He remains stable from a hematologic standpoint.  He will return for lab and follow-up in 3 months.  He will contact the office in the interim with any problems.  We specifically discussed fever, chills, signs of infection, bleeding.    Ned Card ANP/GNP-BC   11/26/2017  11:22 AM

## 2017-11-27 DIAGNOSIS — C189 Malignant neoplasm of colon, unspecified: Secondary | ICD-10-CM | POA: Diagnosis not present

## 2017-11-27 DIAGNOSIS — I10 Essential (primary) hypertension: Secondary | ICD-10-CM | POA: Diagnosis not present

## 2017-11-27 DIAGNOSIS — F411 Generalized anxiety disorder: Secondary | ICD-10-CM | POA: Diagnosis not present

## 2017-11-27 DIAGNOSIS — D469 Myelodysplastic syndrome, unspecified: Secondary | ICD-10-CM | POA: Diagnosis not present

## 2017-11-27 DIAGNOSIS — D61818 Other pancytopenia: Secondary | ICD-10-CM | POA: Diagnosis not present

## 2017-11-27 DIAGNOSIS — N183 Chronic kidney disease, stage 3 (moderate): Secondary | ICD-10-CM | POA: Diagnosis not present

## 2017-11-28 DIAGNOSIS — D61818 Other pancytopenia: Secondary | ICD-10-CM | POA: Diagnosis not present

## 2017-11-28 DIAGNOSIS — F411 Generalized anxiety disorder: Secondary | ICD-10-CM | POA: Diagnosis not present

## 2017-11-28 DIAGNOSIS — D469 Myelodysplastic syndrome, unspecified: Secondary | ICD-10-CM | POA: Diagnosis not present

## 2017-11-28 DIAGNOSIS — C189 Malignant neoplasm of colon, unspecified: Secondary | ICD-10-CM | POA: Diagnosis not present

## 2017-11-28 DIAGNOSIS — N183 Chronic kidney disease, stage 3 (moderate): Secondary | ICD-10-CM | POA: Diagnosis not present

## 2017-11-28 DIAGNOSIS — I10 Essential (primary) hypertension: Secondary | ICD-10-CM | POA: Diagnosis not present

## 2017-11-29 DIAGNOSIS — I10 Essential (primary) hypertension: Secondary | ICD-10-CM | POA: Diagnosis not present

## 2017-11-29 DIAGNOSIS — F411 Generalized anxiety disorder: Secondary | ICD-10-CM | POA: Diagnosis not present

## 2017-11-29 DIAGNOSIS — C189 Malignant neoplasm of colon, unspecified: Secondary | ICD-10-CM | POA: Diagnosis not present

## 2017-11-29 DIAGNOSIS — D61818 Other pancytopenia: Secondary | ICD-10-CM | POA: Diagnosis not present

## 2017-11-29 DIAGNOSIS — N183 Chronic kidney disease, stage 3 (moderate): Secondary | ICD-10-CM | POA: Diagnosis not present

## 2017-11-29 DIAGNOSIS — D469 Myelodysplastic syndrome, unspecified: Secondary | ICD-10-CM | POA: Diagnosis not present

## 2017-12-03 DIAGNOSIS — D469 Myelodysplastic syndrome, unspecified: Secondary | ICD-10-CM | POA: Diagnosis not present

## 2017-12-03 DIAGNOSIS — I10 Essential (primary) hypertension: Secondary | ICD-10-CM | POA: Diagnosis not present

## 2017-12-03 DIAGNOSIS — C189 Malignant neoplasm of colon, unspecified: Secondary | ICD-10-CM | POA: Diagnosis not present

## 2017-12-03 DIAGNOSIS — F411 Generalized anxiety disorder: Secondary | ICD-10-CM | POA: Diagnosis not present

## 2017-12-03 DIAGNOSIS — D61818 Other pancytopenia: Secondary | ICD-10-CM | POA: Diagnosis not present

## 2017-12-03 DIAGNOSIS — N183 Chronic kidney disease, stage 3 (moderate): Secondary | ICD-10-CM | POA: Diagnosis not present

## 2017-12-04 DIAGNOSIS — C189 Malignant neoplasm of colon, unspecified: Secondary | ICD-10-CM | POA: Diagnosis not present

## 2017-12-04 DIAGNOSIS — D61818 Other pancytopenia: Secondary | ICD-10-CM | POA: Diagnosis not present

## 2017-12-04 DIAGNOSIS — N183 Chronic kidney disease, stage 3 (moderate): Secondary | ICD-10-CM | POA: Diagnosis not present

## 2017-12-04 DIAGNOSIS — I10 Essential (primary) hypertension: Secondary | ICD-10-CM | POA: Diagnosis not present

## 2017-12-04 DIAGNOSIS — D469 Myelodysplastic syndrome, unspecified: Secondary | ICD-10-CM | POA: Diagnosis not present

## 2017-12-04 DIAGNOSIS — F411 Generalized anxiety disorder: Secondary | ICD-10-CM | POA: Diagnosis not present

## 2017-12-05 DIAGNOSIS — D469 Myelodysplastic syndrome, unspecified: Secondary | ICD-10-CM | POA: Diagnosis not present

## 2017-12-05 DIAGNOSIS — C189 Malignant neoplasm of colon, unspecified: Secondary | ICD-10-CM | POA: Diagnosis not present

## 2017-12-05 DIAGNOSIS — F411 Generalized anxiety disorder: Secondary | ICD-10-CM | POA: Diagnosis not present

## 2017-12-05 DIAGNOSIS — N183 Chronic kidney disease, stage 3 (moderate): Secondary | ICD-10-CM | POA: Diagnosis not present

## 2017-12-05 DIAGNOSIS — I10 Essential (primary) hypertension: Secondary | ICD-10-CM | POA: Diagnosis not present

## 2017-12-05 DIAGNOSIS — D61818 Other pancytopenia: Secondary | ICD-10-CM | POA: Diagnosis not present

## 2017-12-11 DIAGNOSIS — I10 Essential (primary) hypertension: Secondary | ICD-10-CM | POA: Diagnosis not present

## 2017-12-11 DIAGNOSIS — N183 Chronic kidney disease, stage 3 (moderate): Secondary | ICD-10-CM | POA: Diagnosis not present

## 2017-12-11 DIAGNOSIS — D61818 Other pancytopenia: Secondary | ICD-10-CM | POA: Diagnosis not present

## 2017-12-11 DIAGNOSIS — D469 Myelodysplastic syndrome, unspecified: Secondary | ICD-10-CM | POA: Diagnosis not present

## 2017-12-11 DIAGNOSIS — F411 Generalized anxiety disorder: Secondary | ICD-10-CM | POA: Diagnosis not present

## 2017-12-11 DIAGNOSIS — C189 Malignant neoplasm of colon, unspecified: Secondary | ICD-10-CM | POA: Diagnosis not present

## 2017-12-12 DIAGNOSIS — F411 Generalized anxiety disorder: Secondary | ICD-10-CM | POA: Diagnosis not present

## 2017-12-12 DIAGNOSIS — N183 Chronic kidney disease, stage 3 (moderate): Secondary | ICD-10-CM | POA: Diagnosis not present

## 2017-12-12 DIAGNOSIS — D61818 Other pancytopenia: Secondary | ICD-10-CM | POA: Diagnosis not present

## 2017-12-12 DIAGNOSIS — C189 Malignant neoplasm of colon, unspecified: Secondary | ICD-10-CM | POA: Diagnosis not present

## 2017-12-12 DIAGNOSIS — D469 Myelodysplastic syndrome, unspecified: Secondary | ICD-10-CM | POA: Diagnosis not present

## 2017-12-12 DIAGNOSIS — I10 Essential (primary) hypertension: Secondary | ICD-10-CM | POA: Diagnosis not present

## 2017-12-17 DIAGNOSIS — D61818 Other pancytopenia: Secondary | ICD-10-CM | POA: Diagnosis not present

## 2017-12-17 DIAGNOSIS — D469 Myelodysplastic syndrome, unspecified: Secondary | ICD-10-CM | POA: Diagnosis not present

## 2017-12-17 DIAGNOSIS — N183 Chronic kidney disease, stage 3 (moderate): Secondary | ICD-10-CM | POA: Diagnosis not present

## 2017-12-17 DIAGNOSIS — C189 Malignant neoplasm of colon, unspecified: Secondary | ICD-10-CM | POA: Diagnosis not present

## 2017-12-17 DIAGNOSIS — I10 Essential (primary) hypertension: Secondary | ICD-10-CM | POA: Diagnosis not present

## 2017-12-17 DIAGNOSIS — F411 Generalized anxiety disorder: Secondary | ICD-10-CM | POA: Diagnosis not present

## 2017-12-19 DIAGNOSIS — H3530 Unspecified macular degeneration: Secondary | ICD-10-CM | POA: Diagnosis not present

## 2017-12-19 DIAGNOSIS — I10 Essential (primary) hypertension: Secondary | ICD-10-CM | POA: Diagnosis not present

## 2017-12-19 DIAGNOSIS — H9319 Tinnitus, unspecified ear: Secondary | ICD-10-CM | POA: Diagnosis not present

## 2017-12-19 DIAGNOSIS — C189 Malignant neoplasm of colon, unspecified: Secondary | ICD-10-CM | POA: Diagnosis not present

## 2017-12-19 DIAGNOSIS — D469 Myelodysplastic syndrome, unspecified: Secondary | ICD-10-CM | POA: Diagnosis not present

## 2017-12-19 DIAGNOSIS — L209 Atopic dermatitis, unspecified: Secondary | ICD-10-CM | POA: Diagnosis not present

## 2017-12-19 DIAGNOSIS — N183 Chronic kidney disease, stage 3 (moderate): Secondary | ICD-10-CM | POA: Diagnosis not present

## 2017-12-19 DIAGNOSIS — K219 Gastro-esophageal reflux disease without esophagitis: Secondary | ICD-10-CM | POA: Diagnosis not present

## 2017-12-19 DIAGNOSIS — J309 Allergic rhinitis, unspecified: Secondary | ICD-10-CM | POA: Diagnosis not present

## 2017-12-19 DIAGNOSIS — D61818 Other pancytopenia: Secondary | ICD-10-CM | POA: Diagnosis not present

## 2017-12-19 DIAGNOSIS — F411 Generalized anxiety disorder: Secondary | ICD-10-CM | POA: Diagnosis not present

## 2017-12-20 DIAGNOSIS — D61818 Other pancytopenia: Secondary | ICD-10-CM | POA: Diagnosis not present

## 2017-12-20 DIAGNOSIS — C189 Malignant neoplasm of colon, unspecified: Secondary | ICD-10-CM | POA: Diagnosis not present

## 2017-12-20 DIAGNOSIS — I10 Essential (primary) hypertension: Secondary | ICD-10-CM | POA: Diagnosis not present

## 2017-12-20 DIAGNOSIS — F411 Generalized anxiety disorder: Secondary | ICD-10-CM | POA: Diagnosis not present

## 2017-12-20 DIAGNOSIS — D469 Myelodysplastic syndrome, unspecified: Secondary | ICD-10-CM | POA: Diagnosis not present

## 2017-12-20 DIAGNOSIS — N183 Chronic kidney disease, stage 3 (moderate): Secondary | ICD-10-CM | POA: Diagnosis not present

## 2017-12-25 DIAGNOSIS — F411 Generalized anxiety disorder: Secondary | ICD-10-CM | POA: Diagnosis not present

## 2017-12-25 DIAGNOSIS — I10 Essential (primary) hypertension: Secondary | ICD-10-CM | POA: Diagnosis not present

## 2017-12-25 DIAGNOSIS — D61818 Other pancytopenia: Secondary | ICD-10-CM | POA: Diagnosis not present

## 2017-12-25 DIAGNOSIS — N183 Chronic kidney disease, stage 3 (moderate): Secondary | ICD-10-CM | POA: Diagnosis not present

## 2017-12-25 DIAGNOSIS — D469 Myelodysplastic syndrome, unspecified: Secondary | ICD-10-CM | POA: Diagnosis not present

## 2017-12-25 DIAGNOSIS — C189 Malignant neoplasm of colon, unspecified: Secondary | ICD-10-CM | POA: Diagnosis not present

## 2017-12-27 DIAGNOSIS — F411 Generalized anxiety disorder: Secondary | ICD-10-CM | POA: Diagnosis not present

## 2017-12-27 DIAGNOSIS — C189 Malignant neoplasm of colon, unspecified: Secondary | ICD-10-CM | POA: Diagnosis not present

## 2017-12-27 DIAGNOSIS — N183 Chronic kidney disease, stage 3 (moderate): Secondary | ICD-10-CM | POA: Diagnosis not present

## 2017-12-27 DIAGNOSIS — D61818 Other pancytopenia: Secondary | ICD-10-CM | POA: Diagnosis not present

## 2017-12-27 DIAGNOSIS — D469 Myelodysplastic syndrome, unspecified: Secondary | ICD-10-CM | POA: Diagnosis not present

## 2017-12-27 DIAGNOSIS — I10 Essential (primary) hypertension: Secondary | ICD-10-CM | POA: Diagnosis not present

## 2017-12-28 DIAGNOSIS — C189 Malignant neoplasm of colon, unspecified: Secondary | ICD-10-CM | POA: Diagnosis not present

## 2017-12-28 DIAGNOSIS — D469 Myelodysplastic syndrome, unspecified: Secondary | ICD-10-CM | POA: Diagnosis not present

## 2017-12-28 DIAGNOSIS — F411 Generalized anxiety disorder: Secondary | ICD-10-CM | POA: Diagnosis not present

## 2017-12-28 DIAGNOSIS — I10 Essential (primary) hypertension: Secondary | ICD-10-CM | POA: Diagnosis not present

## 2017-12-28 DIAGNOSIS — D61818 Other pancytopenia: Secondary | ICD-10-CM | POA: Diagnosis not present

## 2017-12-28 DIAGNOSIS — N183 Chronic kidney disease, stage 3 (moderate): Secondary | ICD-10-CM | POA: Diagnosis not present

## 2017-12-31 DIAGNOSIS — C189 Malignant neoplasm of colon, unspecified: Secondary | ICD-10-CM | POA: Diagnosis not present

## 2017-12-31 DIAGNOSIS — D61818 Other pancytopenia: Secondary | ICD-10-CM | POA: Diagnosis not present

## 2017-12-31 DIAGNOSIS — N183 Chronic kidney disease, stage 3 (moderate): Secondary | ICD-10-CM | POA: Diagnosis not present

## 2017-12-31 DIAGNOSIS — D469 Myelodysplastic syndrome, unspecified: Secondary | ICD-10-CM | POA: Diagnosis not present

## 2017-12-31 DIAGNOSIS — I10 Essential (primary) hypertension: Secondary | ICD-10-CM | POA: Diagnosis not present

## 2017-12-31 DIAGNOSIS — F411 Generalized anxiety disorder: Secondary | ICD-10-CM | POA: Diagnosis not present

## 2018-01-01 DIAGNOSIS — I10 Essential (primary) hypertension: Secondary | ICD-10-CM | POA: Diagnosis not present

## 2018-01-01 DIAGNOSIS — F411 Generalized anxiety disorder: Secondary | ICD-10-CM | POA: Diagnosis not present

## 2018-01-01 DIAGNOSIS — N183 Chronic kidney disease, stage 3 (moderate): Secondary | ICD-10-CM | POA: Diagnosis not present

## 2018-01-01 DIAGNOSIS — D469 Myelodysplastic syndrome, unspecified: Secondary | ICD-10-CM | POA: Diagnosis not present

## 2018-01-01 DIAGNOSIS — C189 Malignant neoplasm of colon, unspecified: Secondary | ICD-10-CM | POA: Diagnosis not present

## 2018-01-01 DIAGNOSIS — D61818 Other pancytopenia: Secondary | ICD-10-CM | POA: Diagnosis not present

## 2018-01-04 DIAGNOSIS — I10 Essential (primary) hypertension: Secondary | ICD-10-CM | POA: Diagnosis not present

## 2018-01-04 DIAGNOSIS — C189 Malignant neoplasm of colon, unspecified: Secondary | ICD-10-CM | POA: Diagnosis not present

## 2018-01-04 DIAGNOSIS — F411 Generalized anxiety disorder: Secondary | ICD-10-CM | POA: Diagnosis not present

## 2018-01-04 DIAGNOSIS — D61818 Other pancytopenia: Secondary | ICD-10-CM | POA: Diagnosis not present

## 2018-01-04 DIAGNOSIS — D469 Myelodysplastic syndrome, unspecified: Secondary | ICD-10-CM | POA: Diagnosis not present

## 2018-01-04 DIAGNOSIS — N183 Chronic kidney disease, stage 3 (moderate): Secondary | ICD-10-CM | POA: Diagnosis not present

## 2018-01-08 DIAGNOSIS — N183 Chronic kidney disease, stage 3 (moderate): Secondary | ICD-10-CM | POA: Diagnosis not present

## 2018-01-08 DIAGNOSIS — C189 Malignant neoplasm of colon, unspecified: Secondary | ICD-10-CM | POA: Diagnosis not present

## 2018-01-08 DIAGNOSIS — F411 Generalized anxiety disorder: Secondary | ICD-10-CM | POA: Diagnosis not present

## 2018-01-08 DIAGNOSIS — D61818 Other pancytopenia: Secondary | ICD-10-CM | POA: Diagnosis not present

## 2018-01-08 DIAGNOSIS — I10 Essential (primary) hypertension: Secondary | ICD-10-CM | POA: Diagnosis not present

## 2018-01-08 DIAGNOSIS — D469 Myelodysplastic syndrome, unspecified: Secondary | ICD-10-CM | POA: Diagnosis not present

## 2018-01-11 DIAGNOSIS — N183 Chronic kidney disease, stage 3 (moderate): Secondary | ICD-10-CM | POA: Diagnosis not present

## 2018-01-11 DIAGNOSIS — D61818 Other pancytopenia: Secondary | ICD-10-CM | POA: Diagnosis not present

## 2018-01-11 DIAGNOSIS — F411 Generalized anxiety disorder: Secondary | ICD-10-CM | POA: Diagnosis not present

## 2018-01-11 DIAGNOSIS — C189 Malignant neoplasm of colon, unspecified: Secondary | ICD-10-CM | POA: Diagnosis not present

## 2018-01-11 DIAGNOSIS — D469 Myelodysplastic syndrome, unspecified: Secondary | ICD-10-CM | POA: Diagnosis not present

## 2018-01-11 DIAGNOSIS — I10 Essential (primary) hypertension: Secondary | ICD-10-CM | POA: Diagnosis not present

## 2018-01-15 DIAGNOSIS — D469 Myelodysplastic syndrome, unspecified: Secondary | ICD-10-CM | POA: Diagnosis not present

## 2018-01-15 DIAGNOSIS — N183 Chronic kidney disease, stage 3 (moderate): Secondary | ICD-10-CM | POA: Diagnosis not present

## 2018-01-15 DIAGNOSIS — D61818 Other pancytopenia: Secondary | ICD-10-CM | POA: Diagnosis not present

## 2018-01-15 DIAGNOSIS — F411 Generalized anxiety disorder: Secondary | ICD-10-CM | POA: Diagnosis not present

## 2018-01-15 DIAGNOSIS — I10 Essential (primary) hypertension: Secondary | ICD-10-CM | POA: Diagnosis not present

## 2018-01-15 DIAGNOSIS — C189 Malignant neoplasm of colon, unspecified: Secondary | ICD-10-CM | POA: Diagnosis not present

## 2018-01-18 DIAGNOSIS — I10 Essential (primary) hypertension: Secondary | ICD-10-CM | POA: Diagnosis not present

## 2018-01-18 DIAGNOSIS — F411 Generalized anxiety disorder: Secondary | ICD-10-CM | POA: Diagnosis not present

## 2018-01-18 DIAGNOSIS — C189 Malignant neoplasm of colon, unspecified: Secondary | ICD-10-CM | POA: Diagnosis not present

## 2018-01-18 DIAGNOSIS — N183 Chronic kidney disease, stage 3 (moderate): Secondary | ICD-10-CM | POA: Diagnosis not present

## 2018-01-18 DIAGNOSIS — D469 Myelodysplastic syndrome, unspecified: Secondary | ICD-10-CM | POA: Diagnosis not present

## 2018-01-18 DIAGNOSIS — D61818 Other pancytopenia: Secondary | ICD-10-CM | POA: Diagnosis not present

## 2018-01-19 DIAGNOSIS — C189 Malignant neoplasm of colon, unspecified: Secondary | ICD-10-CM | POA: Diagnosis not present

## 2018-01-19 DIAGNOSIS — L209 Atopic dermatitis, unspecified: Secondary | ICD-10-CM | POA: Diagnosis not present

## 2018-01-19 DIAGNOSIS — J309 Allergic rhinitis, unspecified: Secondary | ICD-10-CM | POA: Diagnosis not present

## 2018-01-19 DIAGNOSIS — H3530 Unspecified macular degeneration: Secondary | ICD-10-CM | POA: Diagnosis not present

## 2018-01-19 DIAGNOSIS — D61818 Other pancytopenia: Secondary | ICD-10-CM | POA: Diagnosis not present

## 2018-01-19 DIAGNOSIS — D469 Myelodysplastic syndrome, unspecified: Secondary | ICD-10-CM | POA: Diagnosis not present

## 2018-01-19 DIAGNOSIS — I10 Essential (primary) hypertension: Secondary | ICD-10-CM | POA: Diagnosis not present

## 2018-01-19 DIAGNOSIS — N183 Chronic kidney disease, stage 3 (moderate): Secondary | ICD-10-CM | POA: Diagnosis not present

## 2018-01-19 DIAGNOSIS — K219 Gastro-esophageal reflux disease without esophagitis: Secondary | ICD-10-CM | POA: Diagnosis not present

## 2018-01-19 DIAGNOSIS — H9319 Tinnitus, unspecified ear: Secondary | ICD-10-CM | POA: Diagnosis not present

## 2018-01-19 DIAGNOSIS — F411 Generalized anxiety disorder: Secondary | ICD-10-CM | POA: Diagnosis not present

## 2018-01-22 DIAGNOSIS — D469 Myelodysplastic syndrome, unspecified: Secondary | ICD-10-CM | POA: Diagnosis not present

## 2018-01-22 DIAGNOSIS — D61818 Other pancytopenia: Secondary | ICD-10-CM | POA: Diagnosis not present

## 2018-01-22 DIAGNOSIS — C189 Malignant neoplasm of colon, unspecified: Secondary | ICD-10-CM | POA: Diagnosis not present

## 2018-01-22 DIAGNOSIS — N183 Chronic kidney disease, stage 3 (moderate): Secondary | ICD-10-CM | POA: Diagnosis not present

## 2018-01-22 DIAGNOSIS — I10 Essential (primary) hypertension: Secondary | ICD-10-CM | POA: Diagnosis not present

## 2018-01-22 DIAGNOSIS — F411 Generalized anxiety disorder: Secondary | ICD-10-CM | POA: Diagnosis not present

## 2018-01-25 DIAGNOSIS — N183 Chronic kidney disease, stage 3 (moderate): Secondary | ICD-10-CM | POA: Diagnosis not present

## 2018-01-25 DIAGNOSIS — D469 Myelodysplastic syndrome, unspecified: Secondary | ICD-10-CM | POA: Diagnosis not present

## 2018-01-25 DIAGNOSIS — D61818 Other pancytopenia: Secondary | ICD-10-CM | POA: Diagnosis not present

## 2018-01-25 DIAGNOSIS — I10 Essential (primary) hypertension: Secondary | ICD-10-CM | POA: Diagnosis not present

## 2018-01-25 DIAGNOSIS — C189 Malignant neoplasm of colon, unspecified: Secondary | ICD-10-CM | POA: Diagnosis not present

## 2018-01-25 DIAGNOSIS — F411 Generalized anxiety disorder: Secondary | ICD-10-CM | POA: Diagnosis not present

## 2018-01-29 DIAGNOSIS — D61818 Other pancytopenia: Secondary | ICD-10-CM | POA: Diagnosis not present

## 2018-01-29 DIAGNOSIS — F411 Generalized anxiety disorder: Secondary | ICD-10-CM | POA: Diagnosis not present

## 2018-01-29 DIAGNOSIS — N183 Chronic kidney disease, stage 3 (moderate): Secondary | ICD-10-CM | POA: Diagnosis not present

## 2018-01-29 DIAGNOSIS — I10 Essential (primary) hypertension: Secondary | ICD-10-CM | POA: Diagnosis not present

## 2018-01-29 DIAGNOSIS — D469 Myelodysplastic syndrome, unspecified: Secondary | ICD-10-CM | POA: Diagnosis not present

## 2018-01-29 DIAGNOSIS — C189 Malignant neoplasm of colon, unspecified: Secondary | ICD-10-CM | POA: Diagnosis not present

## 2018-02-01 DIAGNOSIS — I10 Essential (primary) hypertension: Secondary | ICD-10-CM | POA: Diagnosis not present

## 2018-02-01 DIAGNOSIS — C189 Malignant neoplasm of colon, unspecified: Secondary | ICD-10-CM | POA: Diagnosis not present

## 2018-02-01 DIAGNOSIS — D469 Myelodysplastic syndrome, unspecified: Secondary | ICD-10-CM | POA: Diagnosis not present

## 2018-02-01 DIAGNOSIS — F411 Generalized anxiety disorder: Secondary | ICD-10-CM | POA: Diagnosis not present

## 2018-02-01 DIAGNOSIS — D61818 Other pancytopenia: Secondary | ICD-10-CM | POA: Diagnosis not present

## 2018-02-01 DIAGNOSIS — N183 Chronic kidney disease, stage 3 (moderate): Secondary | ICD-10-CM | POA: Diagnosis not present

## 2018-02-05 DIAGNOSIS — D61818 Other pancytopenia: Secondary | ICD-10-CM | POA: Diagnosis not present

## 2018-02-05 DIAGNOSIS — D469 Myelodysplastic syndrome, unspecified: Secondary | ICD-10-CM | POA: Diagnosis not present

## 2018-02-05 DIAGNOSIS — F411 Generalized anxiety disorder: Secondary | ICD-10-CM | POA: Diagnosis not present

## 2018-02-05 DIAGNOSIS — N183 Chronic kidney disease, stage 3 (moderate): Secondary | ICD-10-CM | POA: Diagnosis not present

## 2018-02-05 DIAGNOSIS — C189 Malignant neoplasm of colon, unspecified: Secondary | ICD-10-CM | POA: Diagnosis not present

## 2018-02-05 DIAGNOSIS — I10 Essential (primary) hypertension: Secondary | ICD-10-CM | POA: Diagnosis not present

## 2018-02-07 DIAGNOSIS — N183 Chronic kidney disease, stage 3 (moderate): Secondary | ICD-10-CM | POA: Diagnosis not present

## 2018-02-07 DIAGNOSIS — C189 Malignant neoplasm of colon, unspecified: Secondary | ICD-10-CM | POA: Diagnosis not present

## 2018-02-07 DIAGNOSIS — I10 Essential (primary) hypertension: Secondary | ICD-10-CM | POA: Diagnosis not present

## 2018-02-07 DIAGNOSIS — D61818 Other pancytopenia: Secondary | ICD-10-CM | POA: Diagnosis not present

## 2018-02-07 DIAGNOSIS — F411 Generalized anxiety disorder: Secondary | ICD-10-CM | POA: Diagnosis not present

## 2018-02-07 DIAGNOSIS — D469 Myelodysplastic syndrome, unspecified: Secondary | ICD-10-CM | POA: Diagnosis not present

## 2018-02-08 DIAGNOSIS — D469 Myelodysplastic syndrome, unspecified: Secondary | ICD-10-CM | POA: Diagnosis not present

## 2018-02-08 DIAGNOSIS — I10 Essential (primary) hypertension: Secondary | ICD-10-CM | POA: Diagnosis not present

## 2018-02-08 DIAGNOSIS — N183 Chronic kidney disease, stage 3 (moderate): Secondary | ICD-10-CM | POA: Diagnosis not present

## 2018-02-08 DIAGNOSIS — D61818 Other pancytopenia: Secondary | ICD-10-CM | POA: Diagnosis not present

## 2018-02-08 DIAGNOSIS — C189 Malignant neoplasm of colon, unspecified: Secondary | ICD-10-CM | POA: Diagnosis not present

## 2018-02-08 DIAGNOSIS — F411 Generalized anxiety disorder: Secondary | ICD-10-CM | POA: Diagnosis not present

## 2018-02-12 DIAGNOSIS — N183 Chronic kidney disease, stage 3 (moderate): Secondary | ICD-10-CM | POA: Diagnosis not present

## 2018-02-12 DIAGNOSIS — D61818 Other pancytopenia: Secondary | ICD-10-CM | POA: Diagnosis not present

## 2018-02-12 DIAGNOSIS — I10 Essential (primary) hypertension: Secondary | ICD-10-CM | POA: Diagnosis not present

## 2018-02-12 DIAGNOSIS — F411 Generalized anxiety disorder: Secondary | ICD-10-CM | POA: Diagnosis not present

## 2018-02-12 DIAGNOSIS — C189 Malignant neoplasm of colon, unspecified: Secondary | ICD-10-CM | POA: Diagnosis not present

## 2018-02-12 DIAGNOSIS — D469 Myelodysplastic syndrome, unspecified: Secondary | ICD-10-CM | POA: Diagnosis not present

## 2018-02-15 DIAGNOSIS — C189 Malignant neoplasm of colon, unspecified: Secondary | ICD-10-CM | POA: Diagnosis not present

## 2018-02-15 DIAGNOSIS — D469 Myelodysplastic syndrome, unspecified: Secondary | ICD-10-CM | POA: Diagnosis not present

## 2018-02-15 DIAGNOSIS — N183 Chronic kidney disease, stage 3 (moderate): Secondary | ICD-10-CM | POA: Diagnosis not present

## 2018-02-15 DIAGNOSIS — F411 Generalized anxiety disorder: Secondary | ICD-10-CM | POA: Diagnosis not present

## 2018-02-15 DIAGNOSIS — D61818 Other pancytopenia: Secondary | ICD-10-CM | POA: Diagnosis not present

## 2018-02-15 DIAGNOSIS — I10 Essential (primary) hypertension: Secondary | ICD-10-CM | POA: Diagnosis not present

## 2018-02-18 DIAGNOSIS — C189 Malignant neoplasm of colon, unspecified: Secondary | ICD-10-CM | POA: Diagnosis not present

## 2018-02-18 DIAGNOSIS — D469 Myelodysplastic syndrome, unspecified: Secondary | ICD-10-CM | POA: Diagnosis not present

## 2018-02-18 DIAGNOSIS — L209 Atopic dermatitis, unspecified: Secondary | ICD-10-CM | POA: Diagnosis not present

## 2018-02-18 DIAGNOSIS — J309 Allergic rhinitis, unspecified: Secondary | ICD-10-CM | POA: Diagnosis not present

## 2018-02-18 DIAGNOSIS — I10 Essential (primary) hypertension: Secondary | ICD-10-CM | POA: Diagnosis not present

## 2018-02-18 DIAGNOSIS — H9319 Tinnitus, unspecified ear: Secondary | ICD-10-CM | POA: Diagnosis not present

## 2018-02-18 DIAGNOSIS — H3530 Unspecified macular degeneration: Secondary | ICD-10-CM | POA: Diagnosis not present

## 2018-02-18 DIAGNOSIS — K219 Gastro-esophageal reflux disease without esophagitis: Secondary | ICD-10-CM | POA: Diagnosis not present

## 2018-02-18 DIAGNOSIS — F411 Generalized anxiety disorder: Secondary | ICD-10-CM | POA: Diagnosis not present

## 2018-02-18 DIAGNOSIS — N183 Chronic kidney disease, stage 3 (moderate): Secondary | ICD-10-CM | POA: Diagnosis not present

## 2018-02-18 DIAGNOSIS — D61818 Other pancytopenia: Secondary | ICD-10-CM | POA: Diagnosis not present

## 2018-02-19 DIAGNOSIS — D469 Myelodysplastic syndrome, unspecified: Secondary | ICD-10-CM | POA: Diagnosis not present

## 2018-02-19 DIAGNOSIS — N183 Chronic kidney disease, stage 3 (moderate): Secondary | ICD-10-CM | POA: Diagnosis not present

## 2018-02-19 DIAGNOSIS — C189 Malignant neoplasm of colon, unspecified: Secondary | ICD-10-CM | POA: Diagnosis not present

## 2018-02-19 DIAGNOSIS — D61818 Other pancytopenia: Secondary | ICD-10-CM | POA: Diagnosis not present

## 2018-02-19 DIAGNOSIS — F411 Generalized anxiety disorder: Secondary | ICD-10-CM | POA: Diagnosis not present

## 2018-02-19 DIAGNOSIS — I10 Essential (primary) hypertension: Secondary | ICD-10-CM | POA: Diagnosis not present

## 2018-02-22 DIAGNOSIS — N183 Chronic kidney disease, stage 3 (moderate): Secondary | ICD-10-CM | POA: Diagnosis not present

## 2018-02-22 DIAGNOSIS — I10 Essential (primary) hypertension: Secondary | ICD-10-CM | POA: Diagnosis not present

## 2018-02-22 DIAGNOSIS — D469 Myelodysplastic syndrome, unspecified: Secondary | ICD-10-CM | POA: Diagnosis not present

## 2018-02-22 DIAGNOSIS — C189 Malignant neoplasm of colon, unspecified: Secondary | ICD-10-CM | POA: Diagnosis not present

## 2018-02-22 DIAGNOSIS — D61818 Other pancytopenia: Secondary | ICD-10-CM | POA: Diagnosis not present

## 2018-02-22 DIAGNOSIS — F411 Generalized anxiety disorder: Secondary | ICD-10-CM | POA: Diagnosis not present

## 2018-02-25 DIAGNOSIS — C189 Malignant neoplasm of colon, unspecified: Secondary | ICD-10-CM | POA: Diagnosis not present

## 2018-02-25 DIAGNOSIS — D469 Myelodysplastic syndrome, unspecified: Secondary | ICD-10-CM | POA: Diagnosis not present

## 2018-02-25 DIAGNOSIS — N183 Chronic kidney disease, stage 3 (moderate): Secondary | ICD-10-CM | POA: Diagnosis not present

## 2018-02-25 DIAGNOSIS — F411 Generalized anxiety disorder: Secondary | ICD-10-CM | POA: Diagnosis not present

## 2018-02-25 DIAGNOSIS — D61818 Other pancytopenia: Secondary | ICD-10-CM | POA: Diagnosis not present

## 2018-02-25 DIAGNOSIS — I10 Essential (primary) hypertension: Secondary | ICD-10-CM | POA: Diagnosis not present

## 2018-02-26 DIAGNOSIS — D61818 Other pancytopenia: Secondary | ICD-10-CM | POA: Diagnosis not present

## 2018-02-26 DIAGNOSIS — I10 Essential (primary) hypertension: Secondary | ICD-10-CM | POA: Diagnosis not present

## 2018-02-26 DIAGNOSIS — F411 Generalized anxiety disorder: Secondary | ICD-10-CM | POA: Diagnosis not present

## 2018-02-26 DIAGNOSIS — N183 Chronic kidney disease, stage 3 (moderate): Secondary | ICD-10-CM | POA: Diagnosis not present

## 2018-02-26 DIAGNOSIS — C189 Malignant neoplasm of colon, unspecified: Secondary | ICD-10-CM | POA: Diagnosis not present

## 2018-02-26 DIAGNOSIS — D469 Myelodysplastic syndrome, unspecified: Secondary | ICD-10-CM | POA: Diagnosis not present

## 2018-03-01 DIAGNOSIS — C189 Malignant neoplasm of colon, unspecified: Secondary | ICD-10-CM | POA: Diagnosis not present

## 2018-03-01 DIAGNOSIS — D469 Myelodysplastic syndrome, unspecified: Secondary | ICD-10-CM | POA: Diagnosis not present

## 2018-03-01 DIAGNOSIS — N183 Chronic kidney disease, stage 3 (moderate): Secondary | ICD-10-CM | POA: Diagnosis not present

## 2018-03-01 DIAGNOSIS — I10 Essential (primary) hypertension: Secondary | ICD-10-CM | POA: Diagnosis not present

## 2018-03-01 DIAGNOSIS — F411 Generalized anxiety disorder: Secondary | ICD-10-CM | POA: Diagnosis not present

## 2018-03-01 DIAGNOSIS — D61818 Other pancytopenia: Secondary | ICD-10-CM | POA: Diagnosis not present

## 2018-03-04 DIAGNOSIS — I10 Essential (primary) hypertension: Secondary | ICD-10-CM | POA: Diagnosis not present

## 2018-03-04 DIAGNOSIS — C189 Malignant neoplasm of colon, unspecified: Secondary | ICD-10-CM | POA: Diagnosis not present

## 2018-03-04 DIAGNOSIS — N183 Chronic kidney disease, stage 3 (moderate): Secondary | ICD-10-CM | POA: Diagnosis not present

## 2018-03-04 DIAGNOSIS — D469 Myelodysplastic syndrome, unspecified: Secondary | ICD-10-CM | POA: Diagnosis not present

## 2018-03-04 DIAGNOSIS — F411 Generalized anxiety disorder: Secondary | ICD-10-CM | POA: Diagnosis not present

## 2018-03-04 DIAGNOSIS — D61818 Other pancytopenia: Secondary | ICD-10-CM | POA: Diagnosis not present

## 2018-03-05 ENCOUNTER — Telehealth: Payer: Self-pay

## 2018-03-05 ENCOUNTER — Inpatient Hospital Stay (HOSPITAL_BASED_OUTPATIENT_CLINIC_OR_DEPARTMENT_OTHER): Admitting: Oncology

## 2018-03-05 ENCOUNTER — Inpatient Hospital Stay: Attending: Oncology

## 2018-03-05 VITALS — BP 137/65 | HR 67 | Temp 98.3°F | Resp 18 | Ht 71.0 in | Wt 196.5 lb

## 2018-03-05 DIAGNOSIS — D469 Myelodysplastic syndrome, unspecified: Secondary | ICD-10-CM

## 2018-03-05 DIAGNOSIS — F411 Generalized anxiety disorder: Secondary | ICD-10-CM | POA: Diagnosis not present

## 2018-03-05 DIAGNOSIS — Z85038 Personal history of other malignant neoplasm of large intestine: Secondary | ICD-10-CM | POA: Insufficient documentation

## 2018-03-05 DIAGNOSIS — N183 Chronic kidney disease, stage 3 (moderate): Secondary | ICD-10-CM | POA: Diagnosis not present

## 2018-03-05 DIAGNOSIS — C189 Malignant neoplasm of colon, unspecified: Secondary | ICD-10-CM | POA: Diagnosis not present

## 2018-03-05 DIAGNOSIS — I10 Essential (primary) hypertension: Secondary | ICD-10-CM

## 2018-03-05 DIAGNOSIS — D61818 Other pancytopenia: Secondary | ICD-10-CM | POA: Diagnosis not present

## 2018-03-05 LAB — CBC WITH DIFFERENTIAL (CANCER CENTER ONLY)
Basophils Absolute: 0 10*3/uL (ref 0.0–0.1)
Basophils Relative: 1 %
EOS ABS: 0 10*3/uL (ref 0.0–0.5)
EOS PCT: 1 %
HCT: 30.5 % — ABNORMAL LOW (ref 38.4–49.9)
HEMOGLOBIN: 10.3 g/dL — AB (ref 13.0–17.1)
Lymphocytes Relative: 12 %
Lymphs Abs: 0.5 10*3/uL — ABNORMAL LOW (ref 0.9–3.3)
MCH: 30.4 pg (ref 27.2–33.4)
MCHC: 33.8 g/dL (ref 32.0–36.0)
MCV: 89.9 fL (ref 79.3–98.0)
MONO ABS: 1.3 10*3/uL — AB (ref 0.1–0.9)
Monocytes Relative: 32 %
NEUTROS PCT: 54 %
Neutro Abs: 2.3 10*3/uL (ref 1.5–6.5)
Platelet Count: 42 10*3/uL — ABNORMAL LOW (ref 140–400)
RBC: 3.39 MIL/uL — AB (ref 4.20–5.82)
RDW: 16.8 % — ABNORMAL HIGH (ref 11.0–14.6)
WBC Count: 4.1 10*3/uL (ref 4.0–10.3)

## 2018-03-05 NOTE — Progress Notes (Signed)
  [place] OFFICE PROGRESS NOTE   Diagnosis: Myelodysplasia  INTERVAL HISTORY:   Mr. Streater returns as scheduled.  He continues follow-up with the [facility] program.  No bleeding or recent infection.  Good appetite.  No new complaint.  Objective:  Vital signs in last 24 hours:  Blood pressure 137/65, pulse 67, temperature 98.3 F (36.8 C), temperature source Oral, resp. rate 18, height _0  (1.803 m), weight 196 lb 8 oz (89.1 kg), SpO2 98 %.    HEENT: No thrush or bleeding Lymphatics: No cervical, supraclavicular, axillary, or inguinal nodes Resp: Distant breath sounds, inspiratory rhonchi at the right posterior base, no respiratory distress Cardio: Regular rate and rhythm GI: No hepatosplenomegaly, no mass, mild tenderness in the right lower abdomen Vascular: No leg edema   Lab Results:  Lab Results  Component Value Date   WBC 4.1 03/05/2018   HGB 10.3 (L) 03/05/2018   HCT 30.5 (L) 03/05/2018   MCV 89.9 03/05/2018   PLT 42 (L) 03/05/2018   NEUTROABS 2.3 03/05/2018     No results found.  Medications: I have reviewed the patient's current medications.   Assessment/Plan: 1. Stage III colon cancer diagnosed in August 2008, status post adjuvant Xeloda chemotherapy, completed in April 2009. He underwent a colonoscopy 11/16/2011 with multiple polyps. 2. History of increased tearing, status post right tear duct stent placement.  3. History of multiple colonic polyps, status post a negative colonoscopy by Dr. Benson Norway in May 2011.  4. Anxiety disorder.  5. Multiple back surgeries.  6. Hypertension.  7. Gastroesophageal reflux disease, status post a Nissen fundoplication.  8. Macular degeneration, followed by Dr. Zadie Rhine.  9. Right ear "tinnitus," and hearing loss followed by Dr. Ernesto Rutherford.  10. Severe microcytic anemia. Ferritin returned low at 7 on 11/13/2011. He was transfused 2 units of blood. Bone marrow biopsy on 11/28/2011 confirmed  decreased iron stores. The hemoglobin normalized. He continues oral iron. 11. Hemoccult positive stool. He underwent an upper endoscopy on 11/16/2011 with findings of moderate gastritis and question atypical duodenal AVMs. There was no evidence of active bleeding. Colonoscopy also on 11/16/2011 showed multiple polyps, hemorrhoids and diverticula. 12. Thrombocytopenia. Stable. 13. Mild leukopenia. Stable. 14. Mildly elevated LDH 11/13/2011. 15. Mildly elevated PT 11/13/2011. 16. 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 There was no evidence of metastatic carcinoma. Storage iron was decreased. Cytogenetic returned with a normal 17 XY karyotype. A molecular FISH panel was negative. Restaging bone marrow 02/18/2016 showed hypercellular bone marrow for age with persistent myelodysplastic state. Blast cell count slightly higher at 16%. Storage iron present. Cytogenetic analysis was normal.   Disposition: Mr. Chapdelaine is stable from a hematologic standpoint.  He has been enrolled in hospice care for the past 2 years.  The myelodysplasia has not progressed to acute leukemia.  He will return for an office visit and CBC in approximately 3 months.  He will receive an influenza vaccine at the next office visit.  Mr. Barbeau will continue follow-up with the home hospice RN.  15 minutes were spent with the patient today.  The majority of the time was used for counseling and coordination of care.  Betsy Coder, MD  03/05/2018  11:51 AM

## 2018-03-05 NOTE — Telephone Encounter (Signed)
Printed avs and calender of upcoming appointment per 7/16 los 

## 2018-03-08 DIAGNOSIS — C189 Malignant neoplasm of colon, unspecified: Secondary | ICD-10-CM | POA: Diagnosis not present

## 2018-03-08 DIAGNOSIS — D469 Myelodysplastic syndrome, unspecified: Secondary | ICD-10-CM | POA: Diagnosis not present

## 2018-03-08 DIAGNOSIS — F411 Generalized anxiety disorder: Secondary | ICD-10-CM | POA: Diagnosis not present

## 2018-03-08 DIAGNOSIS — D61818 Other pancytopenia: Secondary | ICD-10-CM | POA: Diagnosis not present

## 2018-03-08 DIAGNOSIS — I10 Essential (primary) hypertension: Secondary | ICD-10-CM | POA: Diagnosis not present

## 2018-03-08 DIAGNOSIS — N183 Chronic kidney disease, stage 3 (moderate): Secondary | ICD-10-CM | POA: Diagnosis not present

## 2018-03-12 DIAGNOSIS — D469 Myelodysplastic syndrome, unspecified: Secondary | ICD-10-CM | POA: Diagnosis not present

## 2018-03-12 DIAGNOSIS — N183 Chronic kidney disease, stage 3 (moderate): Secondary | ICD-10-CM | POA: Diagnosis not present

## 2018-03-12 DIAGNOSIS — F411 Generalized anxiety disorder: Secondary | ICD-10-CM | POA: Diagnosis not present

## 2018-03-12 DIAGNOSIS — I10 Essential (primary) hypertension: Secondary | ICD-10-CM | POA: Diagnosis not present

## 2018-03-12 DIAGNOSIS — C189 Malignant neoplasm of colon, unspecified: Secondary | ICD-10-CM | POA: Diagnosis not present

## 2018-03-12 DIAGNOSIS — D61818 Other pancytopenia: Secondary | ICD-10-CM | POA: Diagnosis not present

## 2018-03-15 DIAGNOSIS — I10 Essential (primary) hypertension: Secondary | ICD-10-CM | POA: Diagnosis not present

## 2018-03-15 DIAGNOSIS — C189 Malignant neoplasm of colon, unspecified: Secondary | ICD-10-CM | POA: Diagnosis not present

## 2018-03-15 DIAGNOSIS — N183 Chronic kidney disease, stage 3 (moderate): Secondary | ICD-10-CM | POA: Diagnosis not present

## 2018-03-15 DIAGNOSIS — F411 Generalized anxiety disorder: Secondary | ICD-10-CM | POA: Diagnosis not present

## 2018-03-15 DIAGNOSIS — D61818 Other pancytopenia: Secondary | ICD-10-CM | POA: Diagnosis not present

## 2018-03-15 DIAGNOSIS — D469 Myelodysplastic syndrome, unspecified: Secondary | ICD-10-CM | POA: Diagnosis not present

## 2018-03-19 DIAGNOSIS — D61818 Other pancytopenia: Secondary | ICD-10-CM | POA: Diagnosis not present

## 2018-03-19 DIAGNOSIS — F411 Generalized anxiety disorder: Secondary | ICD-10-CM | POA: Diagnosis not present

## 2018-03-19 DIAGNOSIS — D469 Myelodysplastic syndrome, unspecified: Secondary | ICD-10-CM | POA: Diagnosis not present

## 2018-03-19 DIAGNOSIS — C189 Malignant neoplasm of colon, unspecified: Secondary | ICD-10-CM | POA: Diagnosis not present

## 2018-03-19 DIAGNOSIS — N183 Chronic kidney disease, stage 3 (moderate): Secondary | ICD-10-CM | POA: Diagnosis not present

## 2018-03-19 DIAGNOSIS — I10 Essential (primary) hypertension: Secondary | ICD-10-CM | POA: Diagnosis not present

## 2018-03-21 ENCOUNTER — Other Ambulatory Visit: Payer: Self-pay

## 2018-03-21 DIAGNOSIS — H3530 Unspecified macular degeneration: Secondary | ICD-10-CM | POA: Diagnosis not present

## 2018-03-21 DIAGNOSIS — I10 Essential (primary) hypertension: Secondary | ICD-10-CM

## 2018-03-21 DIAGNOSIS — J309 Allergic rhinitis, unspecified: Secondary | ICD-10-CM | POA: Diagnosis not present

## 2018-03-21 DIAGNOSIS — H9319 Tinnitus, unspecified ear: Secondary | ICD-10-CM | POA: Diagnosis not present

## 2018-03-21 DIAGNOSIS — D61818 Other pancytopenia: Secondary | ICD-10-CM | POA: Diagnosis not present

## 2018-03-21 DIAGNOSIS — F411 Generalized anxiety disorder: Secondary | ICD-10-CM | POA: Diagnosis not present

## 2018-03-21 DIAGNOSIS — C189 Malignant neoplasm of colon, unspecified: Secondary | ICD-10-CM | POA: Diagnosis not present

## 2018-03-21 DIAGNOSIS — N183 Chronic kidney disease, stage 3 (moderate): Secondary | ICD-10-CM | POA: Diagnosis not present

## 2018-03-21 DIAGNOSIS — D469 Myelodysplastic syndrome, unspecified: Secondary | ICD-10-CM | POA: Diagnosis not present

## 2018-03-21 DIAGNOSIS — K219 Gastro-esophageal reflux disease without esophagitis: Secondary | ICD-10-CM | POA: Diagnosis not present

## 2018-03-21 DIAGNOSIS — L209 Atopic dermatitis, unspecified: Secondary | ICD-10-CM | POA: Diagnosis not present

## 2018-03-21 MED ORDER — TRAZODONE HCL 300 MG PO TABS: 300.0000 mg | ORAL_TABLET | Freq: Every day | ORAL | 5 refills | Status: DC

## 2018-03-21 MED ORDER — AMLODIPINE BESYLATE 10 MG PO TABS: 10.0000 mg | ORAL_TABLET | Freq: Every day | ORAL | 5 refills | Status: DC

## 2018-03-21 MED ORDER — TRAMADOL HCL 50 MG PO TABS: 50.0000 mg | ORAL_TABLET | Freq: Four times a day (QID) | ORAL | 5 refills | Status: DC | PRN

## 2018-03-21 MED ORDER — LORAZEPAM 2 MG PO TABS: ORAL_TABLET | ORAL | 5 refills | Status: DC

## 2018-03-21 MED ORDER — METOPROLOL SUCCINATE ER 25 MG PO TB24: 25.0000 mg | ORAL_TABLET | Freq: Every day | ORAL | 5 refills | Status: DC

## 2018-03-21 NOTE — Telephone Encounter (Signed)
Refills for trazadone, metoprolol, amlodipine, tramadol, and lorazepam sent to [facility] on [address] by request of Almyra Free with [facility]. This RN made Almyra Free aware.

## 2018-03-22 DIAGNOSIS — I10 Essential (primary) hypertension: Secondary | ICD-10-CM | POA: Diagnosis not present

## 2018-03-22 DIAGNOSIS — F411 Generalized anxiety disorder: Secondary | ICD-10-CM | POA: Diagnosis not present

## 2018-03-22 DIAGNOSIS — D469 Myelodysplastic syndrome, unspecified: Secondary | ICD-10-CM | POA: Diagnosis not present

## 2018-03-22 DIAGNOSIS — D61818 Other pancytopenia: Secondary | ICD-10-CM | POA: Diagnosis not present

## 2018-03-22 DIAGNOSIS — N183 Chronic kidney disease, stage 3 (moderate): Secondary | ICD-10-CM | POA: Diagnosis not present

## 2018-03-22 DIAGNOSIS — C189 Malignant neoplasm of colon, unspecified: Secondary | ICD-10-CM | POA: Diagnosis not present

## 2018-03-25 DIAGNOSIS — I10 Essential (primary) hypertension: Secondary | ICD-10-CM | POA: Diagnosis not present

## 2018-03-25 DIAGNOSIS — D61818 Other pancytopenia: Secondary | ICD-10-CM | POA: Diagnosis not present

## 2018-03-25 DIAGNOSIS — N183 Chronic kidney disease, stage 3 (moderate): Secondary | ICD-10-CM | POA: Diagnosis not present

## 2018-03-25 DIAGNOSIS — C189 Malignant neoplasm of colon, unspecified: Secondary | ICD-10-CM | POA: Diagnosis not present

## 2018-03-25 DIAGNOSIS — D469 Myelodysplastic syndrome, unspecified: Secondary | ICD-10-CM | POA: Diagnosis not present

## 2018-03-25 DIAGNOSIS — F411 Generalized anxiety disorder: Secondary | ICD-10-CM | POA: Diagnosis not present

## 2018-03-26 DIAGNOSIS — D469 Myelodysplastic syndrome, unspecified: Secondary | ICD-10-CM | POA: Diagnosis not present

## 2018-03-26 DIAGNOSIS — I10 Essential (primary) hypertension: Secondary | ICD-10-CM | POA: Diagnosis not present

## 2018-03-26 DIAGNOSIS — F411 Generalized anxiety disorder: Secondary | ICD-10-CM | POA: Diagnosis not present

## 2018-03-26 DIAGNOSIS — N183 Chronic kidney disease, stage 3 (moderate): Secondary | ICD-10-CM | POA: Diagnosis not present

## 2018-03-26 DIAGNOSIS — C189 Malignant neoplasm of colon, unspecified: Secondary | ICD-10-CM | POA: Diagnosis not present

## 2018-03-26 DIAGNOSIS — D61818 Other pancytopenia: Secondary | ICD-10-CM | POA: Diagnosis not present

## 2018-03-28 DIAGNOSIS — D469 Myelodysplastic syndrome, unspecified: Secondary | ICD-10-CM | POA: Diagnosis not present

## 2018-03-28 DIAGNOSIS — C189 Malignant neoplasm of colon, unspecified: Secondary | ICD-10-CM | POA: Diagnosis not present

## 2018-03-28 DIAGNOSIS — F411 Generalized anxiety disorder: Secondary | ICD-10-CM | POA: Diagnosis not present

## 2018-03-28 DIAGNOSIS — D61818 Other pancytopenia: Secondary | ICD-10-CM | POA: Diagnosis not present

## 2018-03-28 DIAGNOSIS — I10 Essential (primary) hypertension: Secondary | ICD-10-CM | POA: Diagnosis not present

## 2018-03-28 DIAGNOSIS — N183 Chronic kidney disease, stage 3 (moderate): Secondary | ICD-10-CM | POA: Diagnosis not present

## 2018-03-29 DIAGNOSIS — D469 Myelodysplastic syndrome, unspecified: Secondary | ICD-10-CM | POA: Diagnosis not present

## 2018-03-29 DIAGNOSIS — F411 Generalized anxiety disorder: Secondary | ICD-10-CM | POA: Diagnosis not present

## 2018-03-29 DIAGNOSIS — D61818 Other pancytopenia: Secondary | ICD-10-CM | POA: Diagnosis not present

## 2018-03-29 DIAGNOSIS — N183 Chronic kidney disease, stage 3 (moderate): Secondary | ICD-10-CM | POA: Diagnosis not present

## 2018-03-29 DIAGNOSIS — I10 Essential (primary) hypertension: Secondary | ICD-10-CM | POA: Diagnosis not present

## 2018-03-29 DIAGNOSIS — C189 Malignant neoplasm of colon, unspecified: Secondary | ICD-10-CM | POA: Diagnosis not present

## 2018-04-02 DIAGNOSIS — D469 Myelodysplastic syndrome, unspecified: Secondary | ICD-10-CM | POA: Diagnosis not present

## 2018-04-02 DIAGNOSIS — C189 Malignant neoplasm of colon, unspecified: Secondary | ICD-10-CM | POA: Diagnosis not present

## 2018-04-02 DIAGNOSIS — F411 Generalized anxiety disorder: Secondary | ICD-10-CM | POA: Diagnosis not present

## 2018-04-02 DIAGNOSIS — N183 Chronic kidney disease, stage 3 (moderate): Secondary | ICD-10-CM | POA: Diagnosis not present

## 2018-04-02 DIAGNOSIS — D61818 Other pancytopenia: Secondary | ICD-10-CM | POA: Diagnosis not present

## 2018-04-02 DIAGNOSIS — I10 Essential (primary) hypertension: Secondary | ICD-10-CM | POA: Diagnosis not present

## 2018-04-05 DIAGNOSIS — D469 Myelodysplastic syndrome, unspecified: Secondary | ICD-10-CM | POA: Diagnosis not present

## 2018-04-05 DIAGNOSIS — C189 Malignant neoplasm of colon, unspecified: Secondary | ICD-10-CM | POA: Diagnosis not present

## 2018-04-05 DIAGNOSIS — I10 Essential (primary) hypertension: Secondary | ICD-10-CM | POA: Diagnosis not present

## 2018-04-05 DIAGNOSIS — D61818 Other pancytopenia: Secondary | ICD-10-CM | POA: Diagnosis not present

## 2018-04-05 DIAGNOSIS — N183 Chronic kidney disease, stage 3 (moderate): Secondary | ICD-10-CM | POA: Diagnosis not present

## 2018-04-05 DIAGNOSIS — F411 Generalized anxiety disorder: Secondary | ICD-10-CM | POA: Diagnosis not present

## 2018-04-09 DIAGNOSIS — N183 Chronic kidney disease, stage 3 (moderate): Secondary | ICD-10-CM | POA: Diagnosis not present

## 2018-04-09 DIAGNOSIS — D469 Myelodysplastic syndrome, unspecified: Secondary | ICD-10-CM | POA: Diagnosis not present

## 2018-04-09 DIAGNOSIS — D61818 Other pancytopenia: Secondary | ICD-10-CM | POA: Diagnosis not present

## 2018-04-09 DIAGNOSIS — I10 Essential (primary) hypertension: Secondary | ICD-10-CM | POA: Diagnosis not present

## 2018-04-09 DIAGNOSIS — F411 Generalized anxiety disorder: Secondary | ICD-10-CM | POA: Diagnosis not present

## 2018-04-09 DIAGNOSIS — C189 Malignant neoplasm of colon, unspecified: Secondary | ICD-10-CM | POA: Diagnosis not present

## 2018-04-12 DIAGNOSIS — F411 Generalized anxiety disorder: Secondary | ICD-10-CM | POA: Diagnosis not present

## 2018-04-12 DIAGNOSIS — C189 Malignant neoplasm of colon, unspecified: Secondary | ICD-10-CM | POA: Diagnosis not present

## 2018-04-12 DIAGNOSIS — D61818 Other pancytopenia: Secondary | ICD-10-CM | POA: Diagnosis not present

## 2018-04-12 DIAGNOSIS — I10 Essential (primary) hypertension: Secondary | ICD-10-CM | POA: Diagnosis not present

## 2018-04-12 DIAGNOSIS — N183 Chronic kidney disease, stage 3 (moderate): Secondary | ICD-10-CM | POA: Diagnosis not present

## 2018-04-12 DIAGNOSIS — D469 Myelodysplastic syndrome, unspecified: Secondary | ICD-10-CM | POA: Diagnosis not present

## 2018-04-16 DIAGNOSIS — D469 Myelodysplastic syndrome, unspecified: Secondary | ICD-10-CM | POA: Diagnosis not present

## 2018-04-16 DIAGNOSIS — N183 Chronic kidney disease, stage 3 (moderate): Secondary | ICD-10-CM | POA: Diagnosis not present

## 2018-04-16 DIAGNOSIS — I10 Essential (primary) hypertension: Secondary | ICD-10-CM | POA: Diagnosis not present

## 2018-04-16 DIAGNOSIS — F411 Generalized anxiety disorder: Secondary | ICD-10-CM | POA: Diagnosis not present

## 2018-04-16 DIAGNOSIS — C189 Malignant neoplasm of colon, unspecified: Secondary | ICD-10-CM | POA: Diagnosis not present

## 2018-04-16 DIAGNOSIS — D61818 Other pancytopenia: Secondary | ICD-10-CM | POA: Diagnosis not present

## 2018-04-18 ENCOUNTER — Other Ambulatory Visit: Payer: Self-pay | Admitting: *Deleted

## 2018-04-18 DIAGNOSIS — F411 Generalized anxiety disorder: Secondary | ICD-10-CM | POA: Diagnosis not present

## 2018-04-18 DIAGNOSIS — N183 Chronic kidney disease, stage 3 (moderate): Secondary | ICD-10-CM | POA: Diagnosis not present

## 2018-04-18 DIAGNOSIS — D61818 Other pancytopenia: Secondary | ICD-10-CM | POA: Diagnosis not present

## 2018-04-18 DIAGNOSIS — I10 Essential (primary) hypertension: Secondary | ICD-10-CM | POA: Diagnosis not present

## 2018-04-18 DIAGNOSIS — D469 Myelodysplastic syndrome, unspecified: Secondary | ICD-10-CM | POA: Diagnosis not present

## 2018-04-18 DIAGNOSIS — C189 Malignant neoplasm of colon, unspecified: Secondary | ICD-10-CM | POA: Diagnosis not present

## 2018-04-19 DIAGNOSIS — D61818 Other pancytopenia: Secondary | ICD-10-CM | POA: Diagnosis not present

## 2018-04-19 DIAGNOSIS — I10 Essential (primary) hypertension: Secondary | ICD-10-CM | POA: Diagnosis not present

## 2018-04-19 DIAGNOSIS — F411 Generalized anxiety disorder: Secondary | ICD-10-CM | POA: Diagnosis not present

## 2018-04-19 DIAGNOSIS — C189 Malignant neoplasm of colon, unspecified: Secondary | ICD-10-CM | POA: Diagnosis not present

## 2018-04-19 DIAGNOSIS — N183 Chronic kidney disease, stage 3 (moderate): Secondary | ICD-10-CM | POA: Diagnosis not present

## 2018-04-19 DIAGNOSIS — D469 Myelodysplastic syndrome, unspecified: Secondary | ICD-10-CM | POA: Diagnosis not present

## 2018-04-21 DIAGNOSIS — D61818 Other pancytopenia: Secondary | ICD-10-CM | POA: Diagnosis not present

## 2018-04-21 DIAGNOSIS — H9319 Tinnitus, unspecified ear: Secondary | ICD-10-CM | POA: Diagnosis not present

## 2018-04-21 DIAGNOSIS — H3530 Unspecified macular degeneration: Secondary | ICD-10-CM | POA: Diagnosis not present

## 2018-04-21 DIAGNOSIS — F411 Generalized anxiety disorder: Secondary | ICD-10-CM | POA: Diagnosis not present

## 2018-04-21 DIAGNOSIS — D469 Myelodysplastic syndrome, unspecified: Secondary | ICD-10-CM | POA: Diagnosis not present

## 2018-04-21 DIAGNOSIS — L209 Atopic dermatitis, unspecified: Secondary | ICD-10-CM | POA: Diagnosis not present

## 2018-04-21 DIAGNOSIS — I10 Essential (primary) hypertension: Secondary | ICD-10-CM | POA: Diagnosis not present

## 2018-04-21 DIAGNOSIS — J309 Allergic rhinitis, unspecified: Secondary | ICD-10-CM | POA: Diagnosis not present

## 2018-04-21 DIAGNOSIS — N183 Chronic kidney disease, stage 3 (moderate): Secondary | ICD-10-CM | POA: Diagnosis not present

## 2018-04-21 DIAGNOSIS — C189 Malignant neoplasm of colon, unspecified: Secondary | ICD-10-CM | POA: Diagnosis not present

## 2018-04-21 DIAGNOSIS — K219 Gastro-esophageal reflux disease without esophagitis: Secondary | ICD-10-CM | POA: Diagnosis not present

## 2018-04-23 DIAGNOSIS — N183 Chronic kidney disease, stage 3 (moderate): Secondary | ICD-10-CM | POA: Diagnosis not present

## 2018-04-23 DIAGNOSIS — D469 Myelodysplastic syndrome, unspecified: Secondary | ICD-10-CM | POA: Diagnosis not present

## 2018-04-23 DIAGNOSIS — I10 Essential (primary) hypertension: Secondary | ICD-10-CM | POA: Diagnosis not present

## 2018-04-23 DIAGNOSIS — C189 Malignant neoplasm of colon, unspecified: Secondary | ICD-10-CM | POA: Diagnosis not present

## 2018-04-23 DIAGNOSIS — F411 Generalized anxiety disorder: Secondary | ICD-10-CM | POA: Diagnosis not present

## 2018-04-23 DIAGNOSIS — D61818 Other pancytopenia: Secondary | ICD-10-CM | POA: Diagnosis not present

## 2018-04-26 DIAGNOSIS — N183 Chronic kidney disease, stage 3 (moderate): Secondary | ICD-10-CM | POA: Diagnosis not present

## 2018-04-26 DIAGNOSIS — I10 Essential (primary) hypertension: Secondary | ICD-10-CM | POA: Diagnosis not present

## 2018-04-26 DIAGNOSIS — D469 Myelodysplastic syndrome, unspecified: Secondary | ICD-10-CM | POA: Diagnosis not present

## 2018-04-26 DIAGNOSIS — F411 Generalized anxiety disorder: Secondary | ICD-10-CM | POA: Diagnosis not present

## 2018-04-26 DIAGNOSIS — C189 Malignant neoplasm of colon, unspecified: Secondary | ICD-10-CM | POA: Diagnosis not present

## 2018-04-26 DIAGNOSIS — D61818 Other pancytopenia: Secondary | ICD-10-CM | POA: Diagnosis not present

## 2018-04-30 DIAGNOSIS — F411 Generalized anxiety disorder: Secondary | ICD-10-CM | POA: Diagnosis not present

## 2018-04-30 DIAGNOSIS — I10 Essential (primary) hypertension: Secondary | ICD-10-CM | POA: Diagnosis not present

## 2018-04-30 DIAGNOSIS — C189 Malignant neoplasm of colon, unspecified: Secondary | ICD-10-CM | POA: Diagnosis not present

## 2018-04-30 DIAGNOSIS — N183 Chronic kidney disease, stage 3 (moderate): Secondary | ICD-10-CM | POA: Diagnosis not present

## 2018-04-30 DIAGNOSIS — D61818 Other pancytopenia: Secondary | ICD-10-CM | POA: Diagnosis not present

## 2018-04-30 DIAGNOSIS — D469 Myelodysplastic syndrome, unspecified: Secondary | ICD-10-CM | POA: Diagnosis not present

## 2018-05-03 DIAGNOSIS — D469 Myelodysplastic syndrome, unspecified: Secondary | ICD-10-CM | POA: Diagnosis not present

## 2018-05-03 DIAGNOSIS — C189 Malignant neoplasm of colon, unspecified: Secondary | ICD-10-CM | POA: Diagnosis not present

## 2018-05-03 DIAGNOSIS — F411 Generalized anxiety disorder: Secondary | ICD-10-CM | POA: Diagnosis not present

## 2018-05-03 DIAGNOSIS — N183 Chronic kidney disease, stage 3 (moderate): Secondary | ICD-10-CM | POA: Diagnosis not present

## 2018-05-03 DIAGNOSIS — D61818 Other pancytopenia: Secondary | ICD-10-CM | POA: Diagnosis not present

## 2018-05-03 DIAGNOSIS — I10 Essential (primary) hypertension: Secondary | ICD-10-CM | POA: Diagnosis not present

## 2018-05-07 DIAGNOSIS — I10 Essential (primary) hypertension: Secondary | ICD-10-CM | POA: Diagnosis not present

## 2018-05-07 DIAGNOSIS — F411 Generalized anxiety disorder: Secondary | ICD-10-CM | POA: Diagnosis not present

## 2018-05-07 DIAGNOSIS — N183 Chronic kidney disease, stage 3 (moderate): Secondary | ICD-10-CM | POA: Diagnosis not present

## 2018-05-07 DIAGNOSIS — C189 Malignant neoplasm of colon, unspecified: Secondary | ICD-10-CM | POA: Diagnosis not present

## 2018-05-07 DIAGNOSIS — D61818 Other pancytopenia: Secondary | ICD-10-CM | POA: Diagnosis not present

## 2018-05-07 DIAGNOSIS — D469 Myelodysplastic syndrome, unspecified: Secondary | ICD-10-CM | POA: Diagnosis not present

## 2018-05-10 DIAGNOSIS — I10 Essential (primary) hypertension: Secondary | ICD-10-CM | POA: Diagnosis not present

## 2018-05-10 DIAGNOSIS — F411 Generalized anxiety disorder: Secondary | ICD-10-CM | POA: Diagnosis not present

## 2018-05-10 DIAGNOSIS — C189 Malignant neoplasm of colon, unspecified: Secondary | ICD-10-CM | POA: Diagnosis not present

## 2018-05-10 DIAGNOSIS — D61818 Other pancytopenia: Secondary | ICD-10-CM | POA: Diagnosis not present

## 2018-05-10 DIAGNOSIS — D469 Myelodysplastic syndrome, unspecified: Secondary | ICD-10-CM | POA: Diagnosis not present

## 2018-05-10 DIAGNOSIS — N183 Chronic kidney disease, stage 3 (moderate): Secondary | ICD-10-CM | POA: Diagnosis not present

## 2018-05-14 DIAGNOSIS — D469 Myelodysplastic syndrome, unspecified: Secondary | ICD-10-CM | POA: Diagnosis not present

## 2018-05-14 DIAGNOSIS — I10 Essential (primary) hypertension: Secondary | ICD-10-CM | POA: Diagnosis not present

## 2018-05-14 DIAGNOSIS — C189 Malignant neoplasm of colon, unspecified: Secondary | ICD-10-CM | POA: Diagnosis not present

## 2018-05-14 DIAGNOSIS — D61818 Other pancytopenia: Secondary | ICD-10-CM | POA: Diagnosis not present

## 2018-05-14 DIAGNOSIS — F411 Generalized anxiety disorder: Secondary | ICD-10-CM | POA: Diagnosis not present

## 2018-05-14 DIAGNOSIS — N183 Chronic kidney disease, stage 3 (moderate): Secondary | ICD-10-CM | POA: Diagnosis not present

## 2018-05-16 DIAGNOSIS — N183 Chronic kidney disease, stage 3 (moderate): Secondary | ICD-10-CM | POA: Diagnosis not present

## 2018-05-16 DIAGNOSIS — F411 Generalized anxiety disorder: Secondary | ICD-10-CM | POA: Diagnosis not present

## 2018-05-16 DIAGNOSIS — C189 Malignant neoplasm of colon, unspecified: Secondary | ICD-10-CM | POA: Diagnosis not present

## 2018-05-16 DIAGNOSIS — D469 Myelodysplastic syndrome, unspecified: Secondary | ICD-10-CM | POA: Diagnosis not present

## 2018-05-16 DIAGNOSIS — I10 Essential (primary) hypertension: Secondary | ICD-10-CM | POA: Diagnosis not present

## 2018-05-16 DIAGNOSIS — D61818 Other pancytopenia: Secondary | ICD-10-CM | POA: Diagnosis not present

## 2018-05-17 DIAGNOSIS — F411 Generalized anxiety disorder: Secondary | ICD-10-CM | POA: Diagnosis not present

## 2018-05-17 DIAGNOSIS — N183 Chronic kidney disease, stage 3 (moderate): Secondary | ICD-10-CM | POA: Diagnosis not present

## 2018-05-17 DIAGNOSIS — D469 Myelodysplastic syndrome, unspecified: Secondary | ICD-10-CM | POA: Diagnosis not present

## 2018-05-17 DIAGNOSIS — D61818 Other pancytopenia: Secondary | ICD-10-CM | POA: Diagnosis not present

## 2018-05-17 DIAGNOSIS — C189 Malignant neoplasm of colon, unspecified: Secondary | ICD-10-CM | POA: Diagnosis not present

## 2018-05-17 DIAGNOSIS — I10 Essential (primary) hypertension: Secondary | ICD-10-CM | POA: Diagnosis not present

## 2018-05-21 DIAGNOSIS — H9319 Tinnitus, unspecified ear: Secondary | ICD-10-CM | POA: Diagnosis not present

## 2018-05-21 DIAGNOSIS — I10 Essential (primary) hypertension: Secondary | ICD-10-CM | POA: Diagnosis not present

## 2018-05-21 DIAGNOSIS — C189 Malignant neoplasm of colon, unspecified: Secondary | ICD-10-CM | POA: Diagnosis not present

## 2018-05-21 DIAGNOSIS — H3530 Unspecified macular degeneration: Secondary | ICD-10-CM | POA: Diagnosis not present

## 2018-05-21 DIAGNOSIS — F411 Generalized anxiety disorder: Secondary | ICD-10-CM | POA: Diagnosis not present

## 2018-05-21 DIAGNOSIS — J309 Allergic rhinitis, unspecified: Secondary | ICD-10-CM | POA: Diagnosis not present

## 2018-05-21 DIAGNOSIS — N183 Chronic kidney disease, stage 3 (moderate): Secondary | ICD-10-CM | POA: Diagnosis not present

## 2018-05-21 DIAGNOSIS — D469 Myelodysplastic syndrome, unspecified: Secondary | ICD-10-CM | POA: Diagnosis not present

## 2018-05-21 DIAGNOSIS — K219 Gastro-esophageal reflux disease without esophagitis: Secondary | ICD-10-CM | POA: Diagnosis not present

## 2018-05-21 DIAGNOSIS — L209 Atopic dermatitis, unspecified: Secondary | ICD-10-CM | POA: Diagnosis not present

## 2018-05-21 DIAGNOSIS — D61818 Other pancytopenia: Secondary | ICD-10-CM | POA: Diagnosis not present

## 2018-05-24 DIAGNOSIS — C189 Malignant neoplasm of colon, unspecified: Secondary | ICD-10-CM | POA: Diagnosis not present

## 2018-05-24 DIAGNOSIS — D61818 Other pancytopenia: Secondary | ICD-10-CM | POA: Diagnosis not present

## 2018-05-24 DIAGNOSIS — D469 Myelodysplastic syndrome, unspecified: Secondary | ICD-10-CM | POA: Diagnosis not present

## 2018-05-24 DIAGNOSIS — N183 Chronic kidney disease, stage 3 (moderate): Secondary | ICD-10-CM | POA: Diagnosis not present

## 2018-05-24 DIAGNOSIS — I10 Essential (primary) hypertension: Secondary | ICD-10-CM | POA: Diagnosis not present

## 2018-05-24 DIAGNOSIS — F411 Generalized anxiety disorder: Secondary | ICD-10-CM | POA: Diagnosis not present

## 2018-05-28 DIAGNOSIS — D469 Myelodysplastic syndrome, unspecified: Secondary | ICD-10-CM | POA: Diagnosis not present

## 2018-05-28 DIAGNOSIS — N183 Chronic kidney disease, stage 3 (moderate): Secondary | ICD-10-CM | POA: Diagnosis not present

## 2018-05-28 DIAGNOSIS — I10 Essential (primary) hypertension: Secondary | ICD-10-CM | POA: Diagnosis not present

## 2018-05-28 DIAGNOSIS — F411 Generalized anxiety disorder: Secondary | ICD-10-CM | POA: Diagnosis not present

## 2018-05-28 DIAGNOSIS — D61818 Other pancytopenia: Secondary | ICD-10-CM | POA: Diagnosis not present

## 2018-05-28 DIAGNOSIS — C189 Malignant neoplasm of colon, unspecified: Secondary | ICD-10-CM | POA: Diagnosis not present

## 2018-05-31 DIAGNOSIS — C189 Malignant neoplasm of colon, unspecified: Secondary | ICD-10-CM | POA: Diagnosis not present

## 2018-05-31 DIAGNOSIS — F411 Generalized anxiety disorder: Secondary | ICD-10-CM | POA: Diagnosis not present

## 2018-05-31 DIAGNOSIS — D61818 Other pancytopenia: Secondary | ICD-10-CM | POA: Diagnosis not present

## 2018-05-31 DIAGNOSIS — N183 Chronic kidney disease, stage 3 (moderate): Secondary | ICD-10-CM | POA: Diagnosis not present

## 2018-05-31 DIAGNOSIS — I10 Essential (primary) hypertension: Secondary | ICD-10-CM | POA: Diagnosis not present

## 2018-05-31 DIAGNOSIS — D469 Myelodysplastic syndrome, unspecified: Secondary | ICD-10-CM | POA: Diagnosis not present

## 2018-06-04 DIAGNOSIS — D469 Myelodysplastic syndrome, unspecified: Secondary | ICD-10-CM | POA: Diagnosis not present

## 2018-06-04 DIAGNOSIS — C189 Malignant neoplasm of colon, unspecified: Secondary | ICD-10-CM | POA: Diagnosis not present

## 2018-06-04 DIAGNOSIS — D61818 Other pancytopenia: Secondary | ICD-10-CM | POA: Diagnosis not present

## 2018-06-04 DIAGNOSIS — N183 Chronic kidney disease, stage 3 (moderate): Secondary | ICD-10-CM | POA: Diagnosis not present

## 2018-06-04 DIAGNOSIS — I10 Essential (primary) hypertension: Secondary | ICD-10-CM | POA: Diagnosis not present

## 2018-06-04 DIAGNOSIS — F411 Generalized anxiety disorder: Secondary | ICD-10-CM | POA: Diagnosis not present

## 2018-06-05 DIAGNOSIS — F411 Generalized anxiety disorder: Secondary | ICD-10-CM | POA: Diagnosis not present

## 2018-06-05 DIAGNOSIS — D469 Myelodysplastic syndrome, unspecified: Secondary | ICD-10-CM | POA: Diagnosis not present

## 2018-06-05 DIAGNOSIS — C189 Malignant neoplasm of colon, unspecified: Secondary | ICD-10-CM | POA: Diagnosis not present

## 2018-06-05 DIAGNOSIS — D61818 Other pancytopenia: Secondary | ICD-10-CM | POA: Diagnosis not present

## 2018-06-05 DIAGNOSIS — I10 Essential (primary) hypertension: Secondary | ICD-10-CM | POA: Diagnosis not present

## 2018-06-05 DIAGNOSIS — N183 Chronic kidney disease, stage 3 (moderate): Secondary | ICD-10-CM | POA: Diagnosis not present

## 2018-06-06 ENCOUNTER — Telehealth: Payer: Self-pay | Admitting: Emergency Medicine

## 2018-06-06 NOTE — Telephone Encounter (Signed)
Verbal order given to [name] at [facility] to continue hospice care and ok for patient to take over the counter pepto bismol

## 2018-06-07 DIAGNOSIS — I10 Essential (primary) hypertension: Secondary | ICD-10-CM | POA: Diagnosis not present

## 2018-06-07 DIAGNOSIS — D469 Myelodysplastic syndrome, unspecified: Secondary | ICD-10-CM | POA: Diagnosis not present

## 2018-06-07 DIAGNOSIS — N183 Chronic kidney disease, stage 3 (moderate): Secondary | ICD-10-CM | POA: Diagnosis not present

## 2018-06-07 DIAGNOSIS — F411 Generalized anxiety disorder: Secondary | ICD-10-CM | POA: Diagnosis not present

## 2018-06-07 DIAGNOSIS — D61818 Other pancytopenia: Secondary | ICD-10-CM | POA: Diagnosis not present

## 2018-06-07 DIAGNOSIS — C189 Malignant neoplasm of colon, unspecified: Secondary | ICD-10-CM | POA: Diagnosis not present

## 2018-06-10 DIAGNOSIS — D469 Myelodysplastic syndrome, unspecified: Secondary | ICD-10-CM | POA: Diagnosis not present

## 2018-06-10 DIAGNOSIS — F411 Generalized anxiety disorder: Secondary | ICD-10-CM | POA: Diagnosis not present

## 2018-06-10 DIAGNOSIS — C189 Malignant neoplasm of colon, unspecified: Secondary | ICD-10-CM | POA: Diagnosis not present

## 2018-06-10 DIAGNOSIS — D61818 Other pancytopenia: Secondary | ICD-10-CM | POA: Diagnosis not present

## 2018-06-10 DIAGNOSIS — I10 Essential (primary) hypertension: Secondary | ICD-10-CM | POA: Diagnosis not present

## 2018-06-10 DIAGNOSIS — N183 Chronic kidney disease, stage 3 (moderate): Secondary | ICD-10-CM | POA: Diagnosis not present

## 2018-06-11 DIAGNOSIS — I10 Essential (primary) hypertension: Secondary | ICD-10-CM | POA: Diagnosis not present

## 2018-06-11 DIAGNOSIS — F411 Generalized anxiety disorder: Secondary | ICD-10-CM | POA: Diagnosis not present

## 2018-06-11 DIAGNOSIS — N183 Chronic kidney disease, stage 3 (moderate): Secondary | ICD-10-CM | POA: Diagnosis not present

## 2018-06-11 DIAGNOSIS — C189 Malignant neoplasm of colon, unspecified: Secondary | ICD-10-CM | POA: Diagnosis not present

## 2018-06-11 DIAGNOSIS — D469 Myelodysplastic syndrome, unspecified: Secondary | ICD-10-CM | POA: Diagnosis not present

## 2018-06-11 DIAGNOSIS — D61818 Other pancytopenia: Secondary | ICD-10-CM | POA: Diagnosis not present

## 2018-06-13 DIAGNOSIS — F411 Generalized anxiety disorder: Secondary | ICD-10-CM | POA: Diagnosis not present

## 2018-06-13 DIAGNOSIS — I10 Essential (primary) hypertension: Secondary | ICD-10-CM | POA: Diagnosis not present

## 2018-06-13 DIAGNOSIS — D469 Myelodysplastic syndrome, unspecified: Secondary | ICD-10-CM | POA: Diagnosis not present

## 2018-06-13 DIAGNOSIS — D61818 Other pancytopenia: Secondary | ICD-10-CM | POA: Diagnosis not present

## 2018-06-13 DIAGNOSIS — N183 Chronic kidney disease, stage 3 (moderate): Secondary | ICD-10-CM | POA: Diagnosis not present

## 2018-06-13 DIAGNOSIS — C189 Malignant neoplasm of colon, unspecified: Secondary | ICD-10-CM | POA: Diagnosis not present

## 2018-06-14 DIAGNOSIS — C189 Malignant neoplasm of colon, unspecified: Secondary | ICD-10-CM | POA: Diagnosis not present

## 2018-06-14 DIAGNOSIS — F411 Generalized anxiety disorder: Secondary | ICD-10-CM | POA: Diagnosis not present

## 2018-06-14 DIAGNOSIS — N183 Chronic kidney disease, stage 3 (moderate): Secondary | ICD-10-CM | POA: Diagnosis not present

## 2018-06-14 DIAGNOSIS — D61818 Other pancytopenia: Secondary | ICD-10-CM | POA: Diagnosis not present

## 2018-06-14 DIAGNOSIS — I10 Essential (primary) hypertension: Secondary | ICD-10-CM | POA: Diagnosis not present

## 2018-06-14 DIAGNOSIS — D469 Myelodysplastic syndrome, unspecified: Secondary | ICD-10-CM | POA: Diagnosis not present

## 2018-06-17 ENCOUNTER — Other Ambulatory Visit: Payer: Self-pay | Admitting: *Deleted

## 2018-06-17 ENCOUNTER — Ambulatory Visit: Payer: Medicare Other | Admitting: Nurse Practitioner

## 2018-06-17 ENCOUNTER — Ambulatory Visit: Payer: Medicare Other

## 2018-06-17 ENCOUNTER — Encounter: Payer: Self-pay | Admitting: Nurse Practitioner

## 2018-06-17 ENCOUNTER — Inpatient Hospital Stay: Payer: Medicare Other

## 2018-06-17 ENCOUNTER — Inpatient Hospital Stay: Payer: Medicare Other | Attending: Nurse Practitioner | Admitting: Nurse Practitioner

## 2018-06-17 ENCOUNTER — Telehealth: Payer: Self-pay

## 2018-06-17 VITALS — BP 140/64 | HR 70 | Temp 97.5°F | Resp 17 | Ht 71.0 in | Wt 194.5 lb

## 2018-06-17 DIAGNOSIS — Z85038 Personal history of other malignant neoplasm of large intestine: Secondary | ICD-10-CM | POA: Insufficient documentation

## 2018-06-17 DIAGNOSIS — Z9221 Personal history of antineoplastic chemotherapy: Secondary | ICD-10-CM | POA: Diagnosis not present

## 2018-06-17 DIAGNOSIS — Z23 Encounter for immunization: Secondary | ICD-10-CM | POA: Diagnosis not present

## 2018-06-17 DIAGNOSIS — D469 Myelodysplastic syndrome, unspecified: Secondary | ICD-10-CM | POA: Diagnosis not present

## 2018-06-17 LAB — CBC WITH DIFFERENTIAL (CANCER CENTER ONLY)
ABS IMMATURE GRANULOCYTES: 0.15 10*3/uL — AB (ref 0.00–0.07)
BASOS ABS: 0 10*3/uL (ref 0.0–0.1)
Basophils Relative: 0 %
EOS ABS: 0 10*3/uL (ref 0.0–0.5)
Eosinophils Relative: 0 %
HCT: 29 % — ABNORMAL LOW (ref 39.0–52.0)
HEMOGLOBIN: 9.3 g/dL — AB (ref 13.0–17.0)
Immature Granulocytes: 3 %
Lymphocytes Relative: 10 %
Lymphs Abs: 0.5 10*3/uL — ABNORMAL LOW (ref 0.7–4.0)
MCH: 29.4 pg (ref 26.0–34.0)
MCHC: 32.1 g/dL (ref 30.0–36.0)
MCV: 91.8 fL (ref 80.0–100.0)
Monocytes Absolute: 1.7 10*3/uL — ABNORMAL HIGH (ref 0.1–1.0)
Monocytes Relative: 35 %
NEUTROS PCT: 52 %
NRBC: 0 % (ref 0.0–0.2)
Neutro Abs: 2.6 10*3/uL (ref 1.7–7.7)
Platelet Count: 45 10*3/uL — ABNORMAL LOW (ref 150–400)
RBC: 3.16 MIL/uL — AB (ref 4.22–5.81)
RDW: 16.9 % — ABNORMAL HIGH (ref 11.5–15.5)
WBC: 4.9 10*3/uL (ref 4.0–10.5)

## 2018-06-17 MED ORDER — INFLUENZA VAC SPLIT HIGH-DOSE 0.5 ML IM SUSY
0.5000 mL | PREFILLED_SYRINGE | Freq: Once | INTRAMUSCULAR | Status: AC
  Administered 2018-06-17: 0.5 mL via INTRAMUSCULAR
  Filled 2018-06-17: qty 0.5

## 2018-06-17 MED ORDER — INFLUENZA VAC SPLIT QUAD 0.5 ML IM SUSY: 0.5000 mL | PREFILLED_SYRINGE | Freq: Once | INTRAMUSCULAR | Status: DC

## 2018-06-17 NOTE — Progress Notes (Addendum)
[name] OFFICE PROGRESS NOTE   Diagnosis: Myelodysplasia  INTERVAL HISTORY:   [name] returns as scheduled.  He reports a recent fall.  He attributes the fall to an allergy medicine which made him "woozy headed".  He has discontinued the allergy medicine and had no further falls.  He reports significant bruising on his arms related to the fall as well as an abrasion.  No fever.  No bowel or bladder problems.  He reports a good appetite.  He has stable mild dyspnea on exertion.  Objective:  Vital signs in last 24 hours:  Blood pressure 140/64, pulse 70, temperature (!) 97.5 F (36.4 C), temperature source Oral, resp. rate 17, height '5\' 11"'$  (1.803 m), weight 194 lb 8 oz (88.2 kg), SpO2 99 %.    HEENT: No thrush or bleeding. Lymphatics: No palpable cervical or supraclavicular lymph nodes. Resp: Distant breath sounds.  No respiratory distress. Cardio: Regular rate and rhythm. GI: Abdomen soft area of mild tenderness right lower abdomen.  No hepatosplenomegaly. Vascular: No leg edema.  Skin: Ecchymoses scattered over the forearms.  Bandage in place at the right lower forearm.   Lab Results:  Lab Results  Component Value Date   WBC 4.9 06/17/2018   HGB 9.3 (L) 06/17/2018   HCT 29.0 (L) 06/17/2018   MCV 91.8 06/17/2018   PLT 45 (L) 06/17/2018   NEUTROABS 2.6 06/17/2018    Imaging:  No results found.  Medications: I have reviewed the patient's current medications.  Assessment/Plan: 1. Stage III colon cancer diagnosed in August 2008, status post adjuvant Xeloda chemotherapy, completed in April 2009. He underwent a colonoscopy 11/16/2011 with multiple polyps. 2. History of increased tearing, status post right tear duct stent placement.  3. History of multiple colonic polyps, status post a negative colonoscopy by Dr. Benson Norway in May 2011.  4. Anxiety disorder.  5. Multiple back surgeries.  6. Hypertension.  7. Gastroesophageal reflux disease, status  post a Nissen fundoplication.  8. Macular degeneration, followed by Dr. Zadie Rhine.  9. Right ear "tinnitus," and hearing loss followed by Dr. Ernesto Rutherford.  10. Severe microcytic anemia. Ferritin returned low at 7 on 11/13/2011. He was transfused 2 units of blood. Bone marrow biopsy on 11/28/2011 confirmed decreased iron stores. The hemoglobin normalized. He continues oral iron. 11. Hemoccult positive stool. He underwent an upper endoscopy on 11/16/2011 with findings of moderate gastritis and question atypical duodenal AVMs. There was no evidence of active bleeding. Colonoscopy also on 11/16/2011 showed multiple polyps, hemorrhoids and diverticula. 12. Thrombocytopenia. Stable. 13. Mild leukopenia. Stable. 14. Mildly elevated LDH 11/13/2011. 15. Mildly elevated PT 11/13/2011. 16. 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 There was no evidence of metastatic carcinoma. Storage iron was decreased. Cytogenetic returned with a normal 24 XY karyotype. A molecular FISH panel was negative. Restaging bone marrow 02/18/2016 showed hypercellular bone marrow for age with persistent myelodysplastic state. Blast cell count slightly higher at 16%. Storage iron present. Cytogenetic analysis was normal.   Disposition: [name] appears unchanged.  We reviewed the CBC from today.  There has been mild progression of the anemia.  He does not appear significantly symptomatic.  Plan to continue to follow with observation.  He will return for a follow-up visit and CBC in 3 months.  He understands to contact the office prior to that visit with signs/symptoms suggestive of progressive anemia.  He will continue follow-up  with the home hospice nurse.  He will receive the influenza vaccine today.  Patient seen with Dr. Benay Spice.    Ned Card ANP/GNP-BC   06/17/2018  12:26 PM  This was a  shared visit with Ned Card.  [name] appears unchanged.  The hemoglobin is lower today, but he is otherwise stable from a hematologic standpoint.  The plan is to continue observation for the myelodysplasia.  He will return for an office and lab visit in 3 months.  He will contact us for symptoms of anemia.  Julieanne Manson, MD

## 2018-06-17 NOTE — Telephone Encounter (Signed)
Printed avs and calender of upcoming appointment. Per 10/28 los 

## 2018-06-18 DIAGNOSIS — F411 Generalized anxiety disorder: Secondary | ICD-10-CM | POA: Diagnosis not present

## 2018-06-18 DIAGNOSIS — D469 Myelodysplastic syndrome, unspecified: Secondary | ICD-10-CM | POA: Diagnosis not present

## 2018-06-18 DIAGNOSIS — D61818 Other pancytopenia: Secondary | ICD-10-CM | POA: Diagnosis not present

## 2018-06-18 DIAGNOSIS — I10 Essential (primary) hypertension: Secondary | ICD-10-CM | POA: Diagnosis not present

## 2018-06-18 DIAGNOSIS — N183 Chronic kidney disease, stage 3 (moderate): Secondary | ICD-10-CM | POA: Diagnosis not present

## 2018-06-18 DIAGNOSIS — C189 Malignant neoplasm of colon, unspecified: Secondary | ICD-10-CM | POA: Diagnosis not present

## 2018-06-21 DIAGNOSIS — D61818 Other pancytopenia: Secondary | ICD-10-CM | POA: Diagnosis not present

## 2018-06-21 DIAGNOSIS — H9319 Tinnitus, unspecified ear: Secondary | ICD-10-CM | POA: Diagnosis not present

## 2018-06-21 DIAGNOSIS — N183 Chronic kidney disease, stage 3 (moderate): Secondary | ICD-10-CM | POA: Diagnosis not present

## 2018-06-21 DIAGNOSIS — H3530 Unspecified macular degeneration: Secondary | ICD-10-CM | POA: Diagnosis not present

## 2018-06-21 DIAGNOSIS — I10 Essential (primary) hypertension: Secondary | ICD-10-CM | POA: Diagnosis not present

## 2018-06-21 DIAGNOSIS — F411 Generalized anxiety disorder: Secondary | ICD-10-CM | POA: Diagnosis not present

## 2018-06-21 DIAGNOSIS — J309 Allergic rhinitis, unspecified: Secondary | ICD-10-CM | POA: Diagnosis not present

## 2018-06-21 DIAGNOSIS — L209 Atopic dermatitis, unspecified: Secondary | ICD-10-CM | POA: Diagnosis not present

## 2018-06-21 DIAGNOSIS — D469 Myelodysplastic syndrome, unspecified: Secondary | ICD-10-CM | POA: Diagnosis not present

## 2018-06-21 DIAGNOSIS — C189 Malignant neoplasm of colon, unspecified: Secondary | ICD-10-CM | POA: Diagnosis not present

## 2018-06-21 DIAGNOSIS — K219 Gastro-esophageal reflux disease without esophagitis: Secondary | ICD-10-CM | POA: Diagnosis not present

## 2018-06-24 DIAGNOSIS — F411 Generalized anxiety disorder: Secondary | ICD-10-CM | POA: Diagnosis not present

## 2018-06-24 DIAGNOSIS — D469 Myelodysplastic syndrome, unspecified: Secondary | ICD-10-CM | POA: Diagnosis not present

## 2018-06-24 DIAGNOSIS — D61818 Other pancytopenia: Secondary | ICD-10-CM | POA: Diagnosis not present

## 2018-06-24 DIAGNOSIS — I10 Essential (primary) hypertension: Secondary | ICD-10-CM | POA: Diagnosis not present

## 2018-06-24 DIAGNOSIS — N183 Chronic kidney disease, stage 3 (moderate): Secondary | ICD-10-CM | POA: Diagnosis not present

## 2018-06-24 DIAGNOSIS — C189 Malignant neoplasm of colon, unspecified: Secondary | ICD-10-CM | POA: Diagnosis not present

## 2018-06-25 DIAGNOSIS — D61818 Other pancytopenia: Secondary | ICD-10-CM | POA: Diagnosis not present

## 2018-06-25 DIAGNOSIS — C189 Malignant neoplasm of colon, unspecified: Secondary | ICD-10-CM | POA: Diagnosis not present

## 2018-06-25 DIAGNOSIS — N183 Chronic kidney disease, stage 3 (moderate): Secondary | ICD-10-CM | POA: Diagnosis not present

## 2018-06-25 DIAGNOSIS — F411 Generalized anxiety disorder: Secondary | ICD-10-CM | POA: Diagnosis not present

## 2018-06-25 DIAGNOSIS — D469 Myelodysplastic syndrome, unspecified: Secondary | ICD-10-CM | POA: Diagnosis not present

## 2018-06-25 DIAGNOSIS — I10 Essential (primary) hypertension: Secondary | ICD-10-CM | POA: Diagnosis not present

## 2018-06-28 DIAGNOSIS — D469 Myelodysplastic syndrome, unspecified: Secondary | ICD-10-CM | POA: Diagnosis not present

## 2018-06-28 DIAGNOSIS — F411 Generalized anxiety disorder: Secondary | ICD-10-CM | POA: Diagnosis not present

## 2018-06-28 DIAGNOSIS — D61818 Other pancytopenia: Secondary | ICD-10-CM | POA: Diagnosis not present

## 2018-06-28 DIAGNOSIS — N183 Chronic kidney disease, stage 3 (moderate): Secondary | ICD-10-CM | POA: Diagnosis not present

## 2018-06-28 DIAGNOSIS — C189 Malignant neoplasm of colon, unspecified: Secondary | ICD-10-CM | POA: Diagnosis not present

## 2018-06-28 DIAGNOSIS — I10 Essential (primary) hypertension: Secondary | ICD-10-CM | POA: Diagnosis not present

## 2018-07-02 DIAGNOSIS — D61818 Other pancytopenia: Secondary | ICD-10-CM | POA: Diagnosis not present

## 2018-07-02 DIAGNOSIS — N183 Chronic kidney disease, stage 3 (moderate): Secondary | ICD-10-CM | POA: Diagnosis not present

## 2018-07-02 DIAGNOSIS — F411 Generalized anxiety disorder: Secondary | ICD-10-CM | POA: Diagnosis not present

## 2018-07-02 DIAGNOSIS — C189 Malignant neoplasm of colon, unspecified: Secondary | ICD-10-CM | POA: Diagnosis not present

## 2018-07-02 DIAGNOSIS — D469 Myelodysplastic syndrome, unspecified: Secondary | ICD-10-CM | POA: Diagnosis not present

## 2018-07-02 DIAGNOSIS — I10 Essential (primary) hypertension: Secondary | ICD-10-CM | POA: Diagnosis not present

## 2018-07-05 DIAGNOSIS — F411 Generalized anxiety disorder: Secondary | ICD-10-CM | POA: Diagnosis not present

## 2018-07-05 DIAGNOSIS — D469 Myelodysplastic syndrome, unspecified: Secondary | ICD-10-CM | POA: Diagnosis not present

## 2018-07-05 DIAGNOSIS — I10 Essential (primary) hypertension: Secondary | ICD-10-CM | POA: Diagnosis not present

## 2018-07-05 DIAGNOSIS — N183 Chronic kidney disease, stage 3 (moderate): Secondary | ICD-10-CM | POA: Diagnosis not present

## 2018-07-05 DIAGNOSIS — C189 Malignant neoplasm of colon, unspecified: Secondary | ICD-10-CM | POA: Diagnosis not present

## 2018-07-05 DIAGNOSIS — D61818 Other pancytopenia: Secondary | ICD-10-CM | POA: Diagnosis not present

## 2018-07-09 DIAGNOSIS — C189 Malignant neoplasm of colon, unspecified: Secondary | ICD-10-CM | POA: Diagnosis not present

## 2018-07-09 DIAGNOSIS — D61818 Other pancytopenia: Secondary | ICD-10-CM | POA: Diagnosis not present

## 2018-07-09 DIAGNOSIS — I10 Essential (primary) hypertension: Secondary | ICD-10-CM | POA: Diagnosis not present

## 2018-07-09 DIAGNOSIS — D469 Myelodysplastic syndrome, unspecified: Secondary | ICD-10-CM | POA: Diagnosis not present

## 2018-07-09 DIAGNOSIS — N183 Chronic kidney disease, stage 3 (moderate): Secondary | ICD-10-CM | POA: Diagnosis not present

## 2018-07-09 DIAGNOSIS — F411 Generalized anxiety disorder: Secondary | ICD-10-CM | POA: Diagnosis not present

## 2018-07-10 DIAGNOSIS — F411 Generalized anxiety disorder: Secondary | ICD-10-CM | POA: Diagnosis not present

## 2018-07-10 DIAGNOSIS — D469 Myelodysplastic syndrome, unspecified: Secondary | ICD-10-CM | POA: Diagnosis not present

## 2018-07-10 DIAGNOSIS — C189 Malignant neoplasm of colon, unspecified: Secondary | ICD-10-CM | POA: Diagnosis not present

## 2018-07-10 DIAGNOSIS — I10 Essential (primary) hypertension: Secondary | ICD-10-CM | POA: Diagnosis not present

## 2018-07-10 DIAGNOSIS — D61818 Other pancytopenia: Secondary | ICD-10-CM | POA: Diagnosis not present

## 2018-07-10 DIAGNOSIS — N183 Chronic kidney disease, stage 3 (moderate): Secondary | ICD-10-CM | POA: Diagnosis not present

## 2018-07-11 DIAGNOSIS — F411 Generalized anxiety disorder: Secondary | ICD-10-CM | POA: Diagnosis not present

## 2018-07-11 DIAGNOSIS — D469 Myelodysplastic syndrome, unspecified: Secondary | ICD-10-CM | POA: Diagnosis not present

## 2018-07-11 DIAGNOSIS — N183 Chronic kidney disease, stage 3 (moderate): Secondary | ICD-10-CM | POA: Diagnosis not present

## 2018-07-11 DIAGNOSIS — D61818 Other pancytopenia: Secondary | ICD-10-CM | POA: Diagnosis not present

## 2018-07-11 DIAGNOSIS — I10 Essential (primary) hypertension: Secondary | ICD-10-CM | POA: Diagnosis not present

## 2018-07-11 DIAGNOSIS — C189 Malignant neoplasm of colon, unspecified: Secondary | ICD-10-CM | POA: Diagnosis not present

## 2018-07-12 DIAGNOSIS — D469 Myelodysplastic syndrome, unspecified: Secondary | ICD-10-CM | POA: Diagnosis not present

## 2018-07-12 DIAGNOSIS — F411 Generalized anxiety disorder: Secondary | ICD-10-CM | POA: Diagnosis not present

## 2018-07-12 DIAGNOSIS — D61818 Other pancytopenia: Secondary | ICD-10-CM | POA: Diagnosis not present

## 2018-07-12 DIAGNOSIS — C189 Malignant neoplasm of colon, unspecified: Secondary | ICD-10-CM | POA: Diagnosis not present

## 2018-07-12 DIAGNOSIS — I10 Essential (primary) hypertension: Secondary | ICD-10-CM | POA: Diagnosis not present

## 2018-07-12 DIAGNOSIS — N183 Chronic kidney disease, stage 3 (moderate): Secondary | ICD-10-CM | POA: Diagnosis not present

## 2018-07-16 DIAGNOSIS — F411 Generalized anxiety disorder: Secondary | ICD-10-CM | POA: Diagnosis not present

## 2018-07-16 DIAGNOSIS — D469 Myelodysplastic syndrome, unspecified: Secondary | ICD-10-CM | POA: Diagnosis not present

## 2018-07-16 DIAGNOSIS — C189 Malignant neoplasm of colon, unspecified: Secondary | ICD-10-CM | POA: Diagnosis not present

## 2018-07-16 DIAGNOSIS — D61818 Other pancytopenia: Secondary | ICD-10-CM | POA: Diagnosis not present

## 2018-07-16 DIAGNOSIS — N183 Chronic kidney disease, stage 3 (moderate): Secondary | ICD-10-CM | POA: Diagnosis not present

## 2018-07-16 DIAGNOSIS — I10 Essential (primary) hypertension: Secondary | ICD-10-CM | POA: Diagnosis not present

## 2018-07-18 DIAGNOSIS — F411 Generalized anxiety disorder: Secondary | ICD-10-CM | POA: Diagnosis not present

## 2018-07-18 DIAGNOSIS — N183 Chronic kidney disease, stage 3 (moderate): Secondary | ICD-10-CM | POA: Diagnosis not present

## 2018-07-18 DIAGNOSIS — D61818 Other pancytopenia: Secondary | ICD-10-CM | POA: Diagnosis not present

## 2018-07-18 DIAGNOSIS — C189 Malignant neoplasm of colon, unspecified: Secondary | ICD-10-CM | POA: Diagnosis not present

## 2018-07-18 DIAGNOSIS — I10 Essential (primary) hypertension: Secondary | ICD-10-CM | POA: Diagnosis not present

## 2018-07-18 DIAGNOSIS — D469 Myelodysplastic syndrome, unspecified: Secondary | ICD-10-CM | POA: Diagnosis not present

## 2018-07-19 DIAGNOSIS — I10 Essential (primary) hypertension: Secondary | ICD-10-CM | POA: Diagnosis not present

## 2018-07-19 DIAGNOSIS — D469 Myelodysplastic syndrome, unspecified: Secondary | ICD-10-CM | POA: Diagnosis not present

## 2018-07-19 DIAGNOSIS — D61818 Other pancytopenia: Secondary | ICD-10-CM | POA: Diagnosis not present

## 2018-07-19 DIAGNOSIS — F411 Generalized anxiety disorder: Secondary | ICD-10-CM | POA: Diagnosis not present

## 2018-07-19 DIAGNOSIS — C189 Malignant neoplasm of colon, unspecified: Secondary | ICD-10-CM | POA: Diagnosis not present

## 2018-07-19 DIAGNOSIS — N183 Chronic kidney disease, stage 3 (moderate): Secondary | ICD-10-CM | POA: Diagnosis not present

## 2018-07-21 DIAGNOSIS — D61818 Other pancytopenia: Secondary | ICD-10-CM | POA: Diagnosis not present

## 2018-07-21 DIAGNOSIS — D469 Myelodysplastic syndrome, unspecified: Secondary | ICD-10-CM | POA: Diagnosis not present

## 2018-07-21 DIAGNOSIS — N183 Chronic kidney disease, stage 3 (moderate): Secondary | ICD-10-CM | POA: Diagnosis not present

## 2018-07-21 DIAGNOSIS — L209 Atopic dermatitis, unspecified: Secondary | ICD-10-CM | POA: Diagnosis not present

## 2018-07-21 DIAGNOSIS — F411 Generalized anxiety disorder: Secondary | ICD-10-CM | POA: Diagnosis not present

## 2018-07-21 DIAGNOSIS — H9319 Tinnitus, unspecified ear: Secondary | ICD-10-CM | POA: Diagnosis not present

## 2018-07-21 DIAGNOSIS — I10 Essential (primary) hypertension: Secondary | ICD-10-CM | POA: Diagnosis not present

## 2018-07-21 DIAGNOSIS — K219 Gastro-esophageal reflux disease without esophagitis: Secondary | ICD-10-CM | POA: Diagnosis not present

## 2018-07-21 DIAGNOSIS — H3530 Unspecified macular degeneration: Secondary | ICD-10-CM | POA: Diagnosis not present

## 2018-07-21 DIAGNOSIS — C189 Malignant neoplasm of colon, unspecified: Secondary | ICD-10-CM | POA: Diagnosis not present

## 2018-07-21 DIAGNOSIS — J309 Allergic rhinitis, unspecified: Secondary | ICD-10-CM | POA: Diagnosis not present

## 2018-07-23 DIAGNOSIS — D469 Myelodysplastic syndrome, unspecified: Secondary | ICD-10-CM | POA: Diagnosis not present

## 2018-07-23 DIAGNOSIS — I10 Essential (primary) hypertension: Secondary | ICD-10-CM | POA: Diagnosis not present

## 2018-07-23 DIAGNOSIS — N183 Chronic kidney disease, stage 3 (moderate): Secondary | ICD-10-CM | POA: Diagnosis not present

## 2018-07-23 DIAGNOSIS — C189 Malignant neoplasm of colon, unspecified: Secondary | ICD-10-CM | POA: Diagnosis not present

## 2018-07-23 DIAGNOSIS — D61818 Other pancytopenia: Secondary | ICD-10-CM | POA: Diagnosis not present

## 2018-07-23 DIAGNOSIS — F411 Generalized anxiety disorder: Secondary | ICD-10-CM | POA: Diagnosis not present

## 2018-07-26 DIAGNOSIS — N183 Chronic kidney disease, stage 3 (moderate): Secondary | ICD-10-CM | POA: Diagnosis not present

## 2018-07-26 DIAGNOSIS — C189 Malignant neoplasm of colon, unspecified: Secondary | ICD-10-CM | POA: Diagnosis not present

## 2018-07-26 DIAGNOSIS — D469 Myelodysplastic syndrome, unspecified: Secondary | ICD-10-CM | POA: Diagnosis not present

## 2018-07-26 DIAGNOSIS — D61818 Other pancytopenia: Secondary | ICD-10-CM | POA: Diagnosis not present

## 2018-07-26 DIAGNOSIS — F411 Generalized anxiety disorder: Secondary | ICD-10-CM | POA: Diagnosis not present

## 2018-07-26 DIAGNOSIS — I10 Essential (primary) hypertension: Secondary | ICD-10-CM | POA: Diagnosis not present

## 2018-07-29 DIAGNOSIS — I10 Essential (primary) hypertension: Secondary | ICD-10-CM | POA: Diagnosis not present

## 2018-07-29 DIAGNOSIS — N183 Chronic kidney disease, stage 3 (moderate): Secondary | ICD-10-CM | POA: Diagnosis not present

## 2018-07-29 DIAGNOSIS — D61818 Other pancytopenia: Secondary | ICD-10-CM | POA: Diagnosis not present

## 2018-07-29 DIAGNOSIS — F411 Generalized anxiety disorder: Secondary | ICD-10-CM | POA: Diagnosis not present

## 2018-07-29 DIAGNOSIS — C189 Malignant neoplasm of colon, unspecified: Secondary | ICD-10-CM | POA: Diagnosis not present

## 2018-07-29 DIAGNOSIS — D469 Myelodysplastic syndrome, unspecified: Secondary | ICD-10-CM | POA: Diagnosis not present

## 2018-07-30 DIAGNOSIS — N183 Chronic kidney disease, stage 3 (moderate): Secondary | ICD-10-CM | POA: Diagnosis not present

## 2018-07-30 DIAGNOSIS — D469 Myelodysplastic syndrome, unspecified: Secondary | ICD-10-CM | POA: Diagnosis not present

## 2018-07-30 DIAGNOSIS — I10 Essential (primary) hypertension: Secondary | ICD-10-CM | POA: Diagnosis not present

## 2018-07-30 DIAGNOSIS — C189 Malignant neoplasm of colon, unspecified: Secondary | ICD-10-CM | POA: Diagnosis not present

## 2018-07-30 DIAGNOSIS — F411 Generalized anxiety disorder: Secondary | ICD-10-CM | POA: Diagnosis not present

## 2018-07-30 DIAGNOSIS — D61818 Other pancytopenia: Secondary | ICD-10-CM | POA: Diagnosis not present

## 2018-08-01 ENCOUNTER — Telehealth: Payer: Self-pay | Admitting: *Deleted

## 2018-08-01 MED ORDER — TRAMADOL HCL 50 MG PO TABS: 50.0000 mg | ORAL_TABLET | Freq: Four times a day (QID) | ORAL | 0 refills | Status: DC | PRN

## 2018-08-01 NOTE — Telephone Encounter (Signed)
Has had #2 falls in the last couple months with most recent causing some left shoulder/chest wall pain. Needs refill on his Tramadol (frequency was increased to every 4 hours by Dr. Lyman Speller). Will need it by Monday. Also need order that it is OK to continue Hospice care.

## 2018-08-01 NOTE — Telephone Encounter (Signed)
Per Dr. Benay Spice, Desha to refill Tramadol, but only q 6 hours prn pain and OK to continue Hospice care. Hospice RN notified. Script faxed to [facility] on [address]

## 2018-08-02 ENCOUNTER — Telehealth: Payer: Self-pay | Admitting: *Deleted

## 2018-08-02 DIAGNOSIS — F411 Generalized anxiety disorder: Secondary | ICD-10-CM | POA: Diagnosis not present

## 2018-08-02 DIAGNOSIS — N183 Chronic kidney disease, stage 3 (moderate): Secondary | ICD-10-CM | POA: Diagnosis not present

## 2018-08-02 DIAGNOSIS — I10 Essential (primary) hypertension: Secondary | ICD-10-CM | POA: Diagnosis not present

## 2018-08-02 DIAGNOSIS — D469 Myelodysplastic syndrome, unspecified: Secondary | ICD-10-CM | POA: Diagnosis not present

## 2018-08-02 DIAGNOSIS — D61818 Other pancytopenia: Secondary | ICD-10-CM | POA: Diagnosis not present

## 2018-08-02 DIAGNOSIS — C189 Malignant neoplasm of colon, unspecified: Secondary | ICD-10-CM | POA: Diagnosis not present

## 2018-08-02 NOTE — Telephone Encounter (Signed)
Patient wants to take his Tramadol every 4 hours instead of every 6 hours. Asking if Dr. Benay Spice will agree to this. Per Dr.Sherrill, when he filled the script yesterday, he stated only take every 6 hours.

## 2018-08-02 NOTE — Telephone Encounter (Signed)
"  Rosezella Florida RN, [facility] [phone]).  [name]. would like to take Tramadol every four hours.  He is very anxious, regular nurse is out and he keeps calling.  Reports pain equals ten in the mornings.  Tramadol works the first four hours but he can't manage the last two.  I think it's generalized pain in addition to left shoulder and rib pain per nursing notes.  Do not believe he's out of medication, just the Tramadol is not managing pain long enough."

## 2018-08-05 DIAGNOSIS — D469 Myelodysplastic syndrome, unspecified: Secondary | ICD-10-CM | POA: Diagnosis not present

## 2018-08-05 DIAGNOSIS — I10 Essential (primary) hypertension: Secondary | ICD-10-CM | POA: Diagnosis not present

## 2018-08-05 DIAGNOSIS — D61818 Other pancytopenia: Secondary | ICD-10-CM | POA: Diagnosis not present

## 2018-08-05 DIAGNOSIS — F411 Generalized anxiety disorder: Secondary | ICD-10-CM | POA: Diagnosis not present

## 2018-08-05 DIAGNOSIS — C189 Malignant neoplasm of colon, unspecified: Secondary | ICD-10-CM | POA: Diagnosis not present

## 2018-08-05 DIAGNOSIS — N183 Chronic kidney disease, stage 3 (moderate): Secondary | ICD-10-CM | POA: Diagnosis not present

## 2018-08-06 DIAGNOSIS — C189 Malignant neoplasm of colon, unspecified: Secondary | ICD-10-CM | POA: Diagnosis not present

## 2018-08-06 DIAGNOSIS — N183 Chronic kidney disease, stage 3 (moderate): Secondary | ICD-10-CM | POA: Diagnosis not present

## 2018-08-06 DIAGNOSIS — D469 Myelodysplastic syndrome, unspecified: Secondary | ICD-10-CM | POA: Diagnosis not present

## 2018-08-06 DIAGNOSIS — I10 Essential (primary) hypertension: Secondary | ICD-10-CM | POA: Diagnosis not present

## 2018-08-06 DIAGNOSIS — D61818 Other pancytopenia: Secondary | ICD-10-CM | POA: Diagnosis not present

## 2018-08-06 DIAGNOSIS — F411 Generalized anxiety disorder: Secondary | ICD-10-CM | POA: Diagnosis not present

## 2018-08-09 DIAGNOSIS — C189 Malignant neoplasm of colon, unspecified: Secondary | ICD-10-CM | POA: Diagnosis not present

## 2018-08-09 DIAGNOSIS — D61818 Other pancytopenia: Secondary | ICD-10-CM | POA: Diagnosis not present

## 2018-08-09 DIAGNOSIS — F411 Generalized anxiety disorder: Secondary | ICD-10-CM | POA: Diagnosis not present

## 2018-08-09 DIAGNOSIS — N183 Chronic kidney disease, stage 3 (moderate): Secondary | ICD-10-CM | POA: Diagnosis not present

## 2018-08-09 DIAGNOSIS — D469 Myelodysplastic syndrome, unspecified: Secondary | ICD-10-CM | POA: Diagnosis not present

## 2018-08-09 DIAGNOSIS — I10 Essential (primary) hypertension: Secondary | ICD-10-CM | POA: Diagnosis not present

## 2018-08-13 DIAGNOSIS — F411 Generalized anxiety disorder: Secondary | ICD-10-CM | POA: Diagnosis not present

## 2018-08-13 DIAGNOSIS — N183 Chronic kidney disease, stage 3 (moderate): Secondary | ICD-10-CM | POA: Diagnosis not present

## 2018-08-13 DIAGNOSIS — C189 Malignant neoplasm of colon, unspecified: Secondary | ICD-10-CM | POA: Diagnosis not present

## 2018-08-13 DIAGNOSIS — D61818 Other pancytopenia: Secondary | ICD-10-CM | POA: Diagnosis not present

## 2018-08-13 DIAGNOSIS — D469 Myelodysplastic syndrome, unspecified: Secondary | ICD-10-CM | POA: Diagnosis not present

## 2018-08-13 DIAGNOSIS — I10 Essential (primary) hypertension: Secondary | ICD-10-CM | POA: Diagnosis not present

## 2018-08-15 DIAGNOSIS — I10 Essential (primary) hypertension: Secondary | ICD-10-CM | POA: Diagnosis not present

## 2018-08-15 DIAGNOSIS — D61818 Other pancytopenia: Secondary | ICD-10-CM | POA: Diagnosis not present

## 2018-08-15 DIAGNOSIS — F411 Generalized anxiety disorder: Secondary | ICD-10-CM | POA: Diagnosis not present

## 2018-08-15 DIAGNOSIS — N183 Chronic kidney disease, stage 3 (moderate): Secondary | ICD-10-CM | POA: Diagnosis not present

## 2018-08-15 DIAGNOSIS — D469 Myelodysplastic syndrome, unspecified: Secondary | ICD-10-CM | POA: Diagnosis not present

## 2018-08-15 DIAGNOSIS — C189 Malignant neoplasm of colon, unspecified: Secondary | ICD-10-CM | POA: Diagnosis not present

## 2018-08-16 DIAGNOSIS — D61818 Other pancytopenia: Secondary | ICD-10-CM | POA: Diagnosis not present

## 2018-08-16 DIAGNOSIS — F411 Generalized anxiety disorder: Secondary | ICD-10-CM | POA: Diagnosis not present

## 2018-08-16 DIAGNOSIS — I10 Essential (primary) hypertension: Secondary | ICD-10-CM | POA: Diagnosis not present

## 2018-08-16 DIAGNOSIS — C189 Malignant neoplasm of colon, unspecified: Secondary | ICD-10-CM | POA: Diagnosis not present

## 2018-08-16 DIAGNOSIS — N183 Chronic kidney disease, stage 3 (moderate): Secondary | ICD-10-CM | POA: Diagnosis not present

## 2018-08-16 DIAGNOSIS — D469 Myelodysplastic syndrome, unspecified: Secondary | ICD-10-CM | POA: Diagnosis not present

## 2018-08-20 DIAGNOSIS — C189 Malignant neoplasm of colon, unspecified: Secondary | ICD-10-CM | POA: Diagnosis not present

## 2018-08-20 DIAGNOSIS — D469 Myelodysplastic syndrome, unspecified: Secondary | ICD-10-CM | POA: Diagnosis not present

## 2018-08-20 DIAGNOSIS — I10 Essential (primary) hypertension: Secondary | ICD-10-CM | POA: Diagnosis not present

## 2018-08-20 DIAGNOSIS — F411 Generalized anxiety disorder: Secondary | ICD-10-CM | POA: Diagnosis not present

## 2018-08-20 DIAGNOSIS — N183 Chronic kidney disease, stage 3 (moderate): Secondary | ICD-10-CM | POA: Diagnosis not present

## 2018-08-20 DIAGNOSIS — D61818 Other pancytopenia: Secondary | ICD-10-CM | POA: Diagnosis not present

## 2018-08-21 DIAGNOSIS — N183 Chronic kidney disease, stage 3 (moderate): Secondary | ICD-10-CM | POA: Diagnosis not present

## 2018-08-21 DIAGNOSIS — H9319 Tinnitus, unspecified ear: Secondary | ICD-10-CM | POA: Diagnosis not present

## 2018-08-21 DIAGNOSIS — K219 Gastro-esophageal reflux disease without esophagitis: Secondary | ICD-10-CM | POA: Diagnosis not present

## 2018-08-21 DIAGNOSIS — C189 Malignant neoplasm of colon, unspecified: Secondary | ICD-10-CM | POA: Diagnosis not present

## 2018-08-21 DIAGNOSIS — H3530 Unspecified macular degeneration: Secondary | ICD-10-CM | POA: Diagnosis not present

## 2018-08-21 DIAGNOSIS — F411 Generalized anxiety disorder: Secondary | ICD-10-CM | POA: Diagnosis not present

## 2018-08-21 DIAGNOSIS — D61818 Other pancytopenia: Secondary | ICD-10-CM | POA: Diagnosis not present

## 2018-08-21 DIAGNOSIS — J309 Allergic rhinitis, unspecified: Secondary | ICD-10-CM | POA: Diagnosis not present

## 2018-08-21 DIAGNOSIS — I10 Essential (primary) hypertension: Secondary | ICD-10-CM | POA: Diagnosis not present

## 2018-08-21 DIAGNOSIS — D469 Myelodysplastic syndrome, unspecified: Secondary | ICD-10-CM | POA: Diagnosis not present

## 2018-08-21 DIAGNOSIS — L209 Atopic dermatitis, unspecified: Secondary | ICD-10-CM | POA: Diagnosis not present

## 2018-08-23 ENCOUNTER — Telehealth: Payer: Self-pay | Admitting: *Deleted

## 2018-08-23 ENCOUNTER — Other Ambulatory Visit: Payer: Self-pay | Admitting: *Deleted

## 2018-08-23 DIAGNOSIS — I10 Essential (primary) hypertension: Secondary | ICD-10-CM | POA: Diagnosis not present

## 2018-08-23 DIAGNOSIS — D469 Myelodysplastic syndrome, unspecified: Secondary | ICD-10-CM | POA: Diagnosis not present

## 2018-08-23 DIAGNOSIS — D61818 Other pancytopenia: Secondary | ICD-10-CM | POA: Diagnosis not present

## 2018-08-23 DIAGNOSIS — F411 Generalized anxiety disorder: Secondary | ICD-10-CM | POA: Diagnosis not present

## 2018-08-23 DIAGNOSIS — C189 Malignant neoplasm of colon, unspecified: Secondary | ICD-10-CM | POA: Diagnosis not present

## 2018-08-23 DIAGNOSIS — N183 Chronic kidney disease, stage 3 (moderate): Secondary | ICD-10-CM | POA: Diagnosis not present

## 2018-08-23 MED ORDER — TRAMADOL HCL 50 MG PO TABS: 50.0000 mg | ORAL_TABLET | Freq: Four times a day (QID) | ORAL | 0 refills | Status: DC | PRN

## 2018-08-23 NOTE — Progress Notes (Signed)
Call today X 2 from hospice RN, Leda Gauze that patient needs refill on Tramadol. Informed her that it will be refilled before the end of business day today. She will let patient know. Refill information left on pharmacy automated refill line.

## 2018-08-23 NOTE — Telephone Encounter (Signed)
[facility] calling requesting refill for Tramadol for [employer].  Callers transferred to collaborative for refill request.  .

## 2018-08-27 DIAGNOSIS — C189 Malignant neoplasm of colon, unspecified: Secondary | ICD-10-CM | POA: Diagnosis not present

## 2018-08-27 DIAGNOSIS — F411 Generalized anxiety disorder: Secondary | ICD-10-CM | POA: Diagnosis not present

## 2018-08-27 DIAGNOSIS — D469 Myelodysplastic syndrome, unspecified: Secondary | ICD-10-CM | POA: Diagnosis not present

## 2018-08-27 DIAGNOSIS — N183 Chronic kidney disease, stage 3 (moderate): Secondary | ICD-10-CM | POA: Diagnosis not present

## 2018-08-27 DIAGNOSIS — I10 Essential (primary) hypertension: Secondary | ICD-10-CM | POA: Diagnosis not present

## 2018-08-27 DIAGNOSIS — D61818 Other pancytopenia: Secondary | ICD-10-CM | POA: Diagnosis not present

## 2018-08-29 DIAGNOSIS — I10 Essential (primary) hypertension: Secondary | ICD-10-CM | POA: Diagnosis not present

## 2018-08-29 DIAGNOSIS — D469 Myelodysplastic syndrome, unspecified: Secondary | ICD-10-CM | POA: Diagnosis not present

## 2018-08-29 DIAGNOSIS — D61818 Other pancytopenia: Secondary | ICD-10-CM | POA: Diagnosis not present

## 2018-08-29 DIAGNOSIS — F411 Generalized anxiety disorder: Secondary | ICD-10-CM | POA: Diagnosis not present

## 2018-08-29 DIAGNOSIS — N183 Chronic kidney disease, stage 3 (moderate): Secondary | ICD-10-CM | POA: Diagnosis not present

## 2018-08-29 DIAGNOSIS — C189 Malignant neoplasm of colon, unspecified: Secondary | ICD-10-CM | POA: Diagnosis not present

## 2018-08-30 DIAGNOSIS — C189 Malignant neoplasm of colon, unspecified: Secondary | ICD-10-CM | POA: Diagnosis not present

## 2018-08-30 DIAGNOSIS — D469 Myelodysplastic syndrome, unspecified: Secondary | ICD-10-CM | POA: Diagnosis not present

## 2018-08-30 DIAGNOSIS — N183 Chronic kidney disease, stage 3 (moderate): Secondary | ICD-10-CM | POA: Diagnosis not present

## 2018-08-30 DIAGNOSIS — D61818 Other pancytopenia: Secondary | ICD-10-CM | POA: Diagnosis not present

## 2018-08-30 DIAGNOSIS — F411 Generalized anxiety disorder: Secondary | ICD-10-CM | POA: Diagnosis not present

## 2018-08-30 DIAGNOSIS — I10 Essential (primary) hypertension: Secondary | ICD-10-CM | POA: Diagnosis not present

## 2018-09-03 ENCOUNTER — Telehealth: Payer: Self-pay | Admitting: *Deleted

## 2018-09-03 ENCOUNTER — Other Ambulatory Visit: Payer: Self-pay | Admitting: *Deleted

## 2018-09-03 DIAGNOSIS — N183 Chronic kidney disease, stage 3 (moderate): Secondary | ICD-10-CM | POA: Diagnosis not present

## 2018-09-03 DIAGNOSIS — D469 Myelodysplastic syndrome, unspecified: Secondary | ICD-10-CM | POA: Diagnosis not present

## 2018-09-03 DIAGNOSIS — D61818 Other pancytopenia: Secondary | ICD-10-CM | POA: Diagnosis not present

## 2018-09-03 DIAGNOSIS — I10 Essential (primary) hypertension: Secondary | ICD-10-CM | POA: Diagnosis not present

## 2018-09-03 DIAGNOSIS — C189 Malignant neoplasm of colon, unspecified: Secondary | ICD-10-CM | POA: Diagnosis not present

## 2018-09-03 DIAGNOSIS — F411 Generalized anxiety disorder: Secondary | ICD-10-CM | POA: Diagnosis not present

## 2018-09-03 MED ORDER — TRAMADOL HCL 50 MG PO TABS: 50.0000 mg | ORAL_TABLET | Freq: Four times a day (QID) | ORAL | 0 refills | Status: DC | PRN

## 2018-09-03 NOTE — Progress Notes (Signed)
Call from Hospice RN that he needs refill on tramadol.

## 2018-09-03 NOTE — Telephone Encounter (Signed)
Almyra Free RN, [place] [phone]) called on Johnny Peppers Jr.'s behalf requesting Tramadol refill.  Has a four and a half day supply left.  Pt. uses [facility] on [address].

## 2018-09-05 DIAGNOSIS — F411 Generalized anxiety disorder: Secondary | ICD-10-CM | POA: Diagnosis not present

## 2018-09-05 DIAGNOSIS — N183 Chronic kidney disease, stage 3 (moderate): Secondary | ICD-10-CM | POA: Diagnosis not present

## 2018-09-05 DIAGNOSIS — D469 Myelodysplastic syndrome, unspecified: Secondary | ICD-10-CM | POA: Diagnosis not present

## 2018-09-05 DIAGNOSIS — D61818 Other pancytopenia: Secondary | ICD-10-CM | POA: Diagnosis not present

## 2018-09-05 DIAGNOSIS — C189 Malignant neoplasm of colon, unspecified: Secondary | ICD-10-CM | POA: Diagnosis not present

## 2018-09-05 DIAGNOSIS — I10 Essential (primary) hypertension: Secondary | ICD-10-CM | POA: Diagnosis not present

## 2018-09-06 DIAGNOSIS — F411 Generalized anxiety disorder: Secondary | ICD-10-CM | POA: Diagnosis not present

## 2018-09-06 DIAGNOSIS — N183 Chronic kidney disease, stage 3 (moderate): Secondary | ICD-10-CM | POA: Diagnosis not present

## 2018-09-06 DIAGNOSIS — I10 Essential (primary) hypertension: Secondary | ICD-10-CM | POA: Diagnosis not present

## 2018-09-06 DIAGNOSIS — C189 Malignant neoplasm of colon, unspecified: Secondary | ICD-10-CM | POA: Diagnosis not present

## 2018-09-06 DIAGNOSIS — D61818 Other pancytopenia: Secondary | ICD-10-CM | POA: Diagnosis not present

## 2018-09-06 DIAGNOSIS — D469 Myelodysplastic syndrome, unspecified: Secondary | ICD-10-CM | POA: Diagnosis not present

## 2018-09-10 DIAGNOSIS — I10 Essential (primary) hypertension: Secondary | ICD-10-CM | POA: Diagnosis not present

## 2018-09-10 DIAGNOSIS — N183 Chronic kidney disease, stage 3 (moderate): Secondary | ICD-10-CM | POA: Diagnosis not present

## 2018-09-10 DIAGNOSIS — F411 Generalized anxiety disorder: Secondary | ICD-10-CM | POA: Diagnosis not present

## 2018-09-10 DIAGNOSIS — D469 Myelodysplastic syndrome, unspecified: Secondary | ICD-10-CM | POA: Diagnosis not present

## 2018-09-10 DIAGNOSIS — C189 Malignant neoplasm of colon, unspecified: Secondary | ICD-10-CM | POA: Diagnosis not present

## 2018-09-10 DIAGNOSIS — D61818 Other pancytopenia: Secondary | ICD-10-CM | POA: Diagnosis not present

## 2018-09-13 DIAGNOSIS — C189 Malignant neoplasm of colon, unspecified: Secondary | ICD-10-CM | POA: Diagnosis not present

## 2018-09-13 DIAGNOSIS — N183 Chronic kidney disease, stage 3 (moderate): Secondary | ICD-10-CM | POA: Diagnosis not present

## 2018-09-13 DIAGNOSIS — D61818 Other pancytopenia: Secondary | ICD-10-CM | POA: Diagnosis not present

## 2018-09-13 DIAGNOSIS — D469 Myelodysplastic syndrome, unspecified: Secondary | ICD-10-CM | POA: Diagnosis not present

## 2018-09-13 DIAGNOSIS — F411 Generalized anxiety disorder: Secondary | ICD-10-CM | POA: Diagnosis not present

## 2018-09-13 DIAGNOSIS — I10 Essential (primary) hypertension: Secondary | ICD-10-CM | POA: Diagnosis not present

## 2018-09-16 ENCOUNTER — Inpatient Hospital Stay: Payer: Medicare Other | Attending: Nurse Practitioner

## 2018-09-16 ENCOUNTER — Telehealth: Payer: Self-pay

## 2018-09-16 ENCOUNTER — Inpatient Hospital Stay (HOSPITAL_BASED_OUTPATIENT_CLINIC_OR_DEPARTMENT_OTHER): Payer: Medicare Other | Admitting: Oncology

## 2018-09-16 VITALS — BP 138/64 | HR 71 | Temp 97.6°F | Resp 18 | Ht 71.0 in | Wt 189.9 lb

## 2018-09-16 DIAGNOSIS — I10 Essential (primary) hypertension: Secondary | ICD-10-CM | POA: Diagnosis not present

## 2018-09-16 DIAGNOSIS — Z85038 Personal history of other malignant neoplasm of large intestine: Secondary | ICD-10-CM | POA: Insufficient documentation

## 2018-09-16 DIAGNOSIS — D469 Myelodysplastic syndrome, unspecified: Secondary | ICD-10-CM

## 2018-09-16 DIAGNOSIS — Z9221 Personal history of antineoplastic chemotherapy: Secondary | ICD-10-CM

## 2018-09-16 LAB — CBC WITH DIFFERENTIAL (CANCER CENTER ONLY)
Abs Immature Granulocytes: 0.25 10*3/uL — ABNORMAL HIGH (ref 0.00–0.07)
BASOS ABS: 0 10*3/uL (ref 0.0–0.1)
Basophils Relative: 0 %
Eosinophils Absolute: 0 10*3/uL (ref 0.0–0.5)
Eosinophils Relative: 0 %
HEMATOCRIT: 29.1 % — AB (ref 39.0–52.0)
HEMOGLOBIN: 9.3 g/dL — AB (ref 13.0–17.0)
Immature Granulocytes: 5 %
LYMPHS ABS: 0.5 10*3/uL — AB (ref 0.7–4.0)
LYMPHS PCT: 9 %
MCH: 29.3 pg (ref 26.0–34.0)
MCHC: 32 g/dL (ref 30.0–36.0)
MCV: 91.8 fL (ref 80.0–100.0)
MONO ABS: 1.6 10*3/uL — AB (ref 0.1–1.0)
MONOS PCT: 31 %
Neutro Abs: 2.8 10*3/uL (ref 1.7–7.7)
Neutrophils Relative %: 55 %
Platelet Count: 31 10*3/uL — ABNORMAL LOW (ref 150–400)
RBC: 3.17 MIL/uL — ABNORMAL LOW (ref 4.22–5.81)
RDW: 17.2 % — ABNORMAL HIGH (ref 11.5–15.5)
WBC Count: 5.1 10*3/uL (ref 4.0–10.5)
nRBC: 0 % (ref 0.0–0.2)

## 2018-09-16 MED ORDER — TRAMADOL HCL 50 MG PO TABS: 50.0000 mg | ORAL_TABLET | Freq: Four times a day (QID) | ORAL | 5 refills | Status: AC | PRN

## 2018-09-16 NOTE — Telephone Encounter (Signed)
Printed avs and calender of upcoming appointment. Per 1/27 los 

## 2018-09-16 NOTE — Progress Notes (Signed)
  [name] OFFICE PROGRESS NOTE   Diagnosis: Myelodysplasia  INTERVAL HISTORY:   [name] turns for scheduled visit.  He is enrolled in hospice care.  He has pain in the ribs and knees.  He takes tramadol 4 times daily.  He reports he would not be to ambulate without tramadol.  No fever or bleeding.  Objective:  Vital signs in last 24 hours:  Blood pressure 138/64, pulse 71, temperature 97.6 F (36.4 C), temperature source Oral, resp. rate 18, height '5\' 11"'$  (1.803 m), weight 189 lb 14.4 oz (86.1 kg), SpO2 100 %.    Resp: Decreased breath sounds at the right posterior base, no respiratory distress Cardio: Regular rhythm with premature beats GI: No hepatosplenomegaly Vascular: No leg edema   Lab Results:  Lab Results  Component Value Date   WBC 5.1 09/16/2018   HGB 9.3 (L) 09/16/2018   HCT 29.1 (L) 09/16/2018   MCV 91.8 09/16/2018   PLT 31 (L) 09/16/2018   NEUTROABS 2.8 09/16/2018     Medications: I have reviewed the patient's current medications.   Assessment/Plan: 1. Stage III colon cancer diagnosed in August 2008, status post adjuvant Xeloda chemotherapy, completed in April 2009. He underwent a colonoscopy 11/16/2011 with multiple polyps. 2. History of increased tearing, status post right tear duct stent placement.  3. History of multiple colonic polyps, status post a negative colonoscopy by Dr. Benson Norway in May 2011.  4. Anxiety disorder.  5. Multiple back surgeries.  6. Hypertension.  7. Gastroesophageal reflux disease, status post a Nissen fundoplication.  8. Macular degeneration, followed by Dr. Zadie Rhine.  9. Right ear "tinnitus," and hearing loss followed by Dr. Ernesto Rutherford.  10. Severe microcytic anemia. Ferritin returned low at 7 on 11/13/2011. He was transfused 2 units of blood. Bone marrow biopsy on 11/28/2011 confirmed decreased iron stores. The hemoglobin normalized. He continues oral iron. 11. Hemoccult positive stool. He underwent an  upper endoscopy on 11/16/2011 with findings of moderate gastritis and question atypical duodenal AVMs. There was no evidence of active bleeding. Colonoscopy also on 11/16/2011 showed multiple polyps, hemorrhoids and diverticula. 12. Thrombocytopenia. Stable. 13. Mild leukopenia. Stable. 14. Mildly elevated LDH 11/13/2011. 15. Mildly elevated PT 11/13/2011. 16. 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 There was no evidence of metastatic carcinoma. Storage iron was decreased. Cytogenetic returned with a normal 30 XY karyotype. A molecular FISH panel was negative. Restaging bone marrow 02/18/2016 showed hypercellular bone marrow for age with persistent myelodysplastic state. Blast cell count slightly higher at 16%. Storage iron present. Cytogenetic analysis was normal.   Disposition: [name] has myelodysplasia.  The hemoglobin is lower over the past 6 months.  He has not developed transformation to AML.  I remain concerned that this could happen at any moment.  He will continue hospice care.  I refilled the prescription for tramadol. [name] will return for an office visit in 3 months.  Betsy Coder, MD  09/16/2018  11:26 AM

## 2018-09-17 DIAGNOSIS — C189 Malignant neoplasm of colon, unspecified: Secondary | ICD-10-CM | POA: Diagnosis not present

## 2018-09-17 DIAGNOSIS — F411 Generalized anxiety disorder: Secondary | ICD-10-CM | POA: Diagnosis not present

## 2018-09-17 DIAGNOSIS — D469 Myelodysplastic syndrome, unspecified: Secondary | ICD-10-CM | POA: Diagnosis not present

## 2018-09-17 DIAGNOSIS — N183 Chronic kidney disease, stage 3 (moderate): Secondary | ICD-10-CM | POA: Diagnosis not present

## 2018-09-17 DIAGNOSIS — D61818 Other pancytopenia: Secondary | ICD-10-CM | POA: Diagnosis not present

## 2018-09-17 DIAGNOSIS — I10 Essential (primary) hypertension: Secondary | ICD-10-CM | POA: Diagnosis not present

## 2018-09-18 ENCOUNTER — Telehealth: Payer: Self-pay | Admitting: *Deleted

## 2018-09-18 DIAGNOSIS — N183 Chronic kidney disease, stage 3 (moderate): Secondary | ICD-10-CM | POA: Diagnosis not present

## 2018-09-18 DIAGNOSIS — D469 Myelodysplastic syndrome, unspecified: Secondary | ICD-10-CM | POA: Diagnosis not present

## 2018-09-18 DIAGNOSIS — I10 Essential (primary) hypertension: Secondary | ICD-10-CM | POA: Diagnosis not present

## 2018-09-18 DIAGNOSIS — F411 Generalized anxiety disorder: Secondary | ICD-10-CM | POA: Diagnosis not present

## 2018-09-18 DIAGNOSIS — D61818 Other pancytopenia: Secondary | ICD-10-CM | POA: Diagnosis not present

## 2018-09-18 DIAGNOSIS — C189 Malignant neoplasm of colon, unspecified: Secondary | ICD-10-CM | POA: Diagnosis not present

## 2018-09-18 MED ORDER — AMLODIPINE BESYLATE 10 MG PO TABS: 10.0000 mg | ORAL_TABLET | Freq: Every day | ORAL | 5 refills | Status: AC

## 2018-09-18 MED ORDER — LORAZEPAM 2 MG PO TABS: ORAL_TABLET | ORAL | 5 refills | Status: AC

## 2018-09-18 NOTE — Telephone Encounter (Addendum)
Needs refills on Tramadol, Lorazepam, Amlodipine and Hospice plan w/[employer] has approved him to obtain 30 day supply on all his meds. Also need verbal order to continue Hospice services and his weight at last visit. Called back and informed her Dr. Benay Spice already refilled the tramadol and remaining scripts will be ordered today. Provided weight and OK to continue Hospice care.

## 2018-09-20 DIAGNOSIS — C189 Malignant neoplasm of colon, unspecified: Secondary | ICD-10-CM | POA: Diagnosis not present

## 2018-09-20 DIAGNOSIS — N183 Chronic kidney disease, stage 3 (moderate): Secondary | ICD-10-CM | POA: Diagnosis not present

## 2018-09-20 DIAGNOSIS — I10 Essential (primary) hypertension: Secondary | ICD-10-CM | POA: Diagnosis not present

## 2018-09-20 DIAGNOSIS — D469 Myelodysplastic syndrome, unspecified: Secondary | ICD-10-CM | POA: Diagnosis not present

## 2018-09-20 DIAGNOSIS — D61818 Other pancytopenia: Secondary | ICD-10-CM | POA: Diagnosis not present

## 2018-09-20 DIAGNOSIS — F411 Generalized anxiety disorder: Secondary | ICD-10-CM | POA: Diagnosis not present

## 2018-09-21 DIAGNOSIS — N183 Chronic kidney disease, stage 3 (moderate): Secondary | ICD-10-CM | POA: Diagnosis not present

## 2018-09-21 DIAGNOSIS — L209 Atopic dermatitis, unspecified: Secondary | ICD-10-CM | POA: Diagnosis not present

## 2018-09-21 DIAGNOSIS — D61818 Other pancytopenia: Secondary | ICD-10-CM | POA: Diagnosis not present

## 2018-09-21 DIAGNOSIS — J309 Allergic rhinitis, unspecified: Secondary | ICD-10-CM | POA: Diagnosis not present

## 2018-09-21 DIAGNOSIS — H9319 Tinnitus, unspecified ear: Secondary | ICD-10-CM | POA: Diagnosis not present

## 2018-09-21 DIAGNOSIS — D469 Myelodysplastic syndrome, unspecified: Secondary | ICD-10-CM | POA: Diagnosis not present

## 2018-09-21 DIAGNOSIS — H3530 Unspecified macular degeneration: Secondary | ICD-10-CM | POA: Diagnosis not present

## 2018-09-21 DIAGNOSIS — I10 Essential (primary) hypertension: Secondary | ICD-10-CM | POA: Diagnosis not present

## 2018-09-21 DIAGNOSIS — F411 Generalized anxiety disorder: Secondary | ICD-10-CM | POA: Diagnosis not present

## 2018-09-21 DIAGNOSIS — C189 Malignant neoplasm of colon, unspecified: Secondary | ICD-10-CM | POA: Diagnosis not present

## 2018-09-21 DIAGNOSIS — K219 Gastro-esophageal reflux disease without esophagitis: Secondary | ICD-10-CM | POA: Diagnosis not present

## 2018-09-24 DIAGNOSIS — C189 Malignant neoplasm of colon, unspecified: Secondary | ICD-10-CM | POA: Diagnosis not present

## 2018-09-24 DIAGNOSIS — D469 Myelodysplastic syndrome, unspecified: Secondary | ICD-10-CM | POA: Diagnosis not present

## 2018-09-24 DIAGNOSIS — N183 Chronic kidney disease, stage 3 (moderate): Secondary | ICD-10-CM | POA: Diagnosis not present

## 2018-09-24 DIAGNOSIS — D61818 Other pancytopenia: Secondary | ICD-10-CM | POA: Diagnosis not present

## 2018-09-24 DIAGNOSIS — I10 Essential (primary) hypertension: Secondary | ICD-10-CM | POA: Diagnosis not present

## 2018-09-24 DIAGNOSIS — F411 Generalized anxiety disorder: Secondary | ICD-10-CM | POA: Diagnosis not present

## 2018-09-25 DIAGNOSIS — N183 Chronic kidney disease, stage 3 (moderate): Secondary | ICD-10-CM | POA: Diagnosis not present

## 2018-09-25 DIAGNOSIS — F411 Generalized anxiety disorder: Secondary | ICD-10-CM | POA: Diagnosis not present

## 2018-09-25 DIAGNOSIS — D469 Myelodysplastic syndrome, unspecified: Secondary | ICD-10-CM | POA: Diagnosis not present

## 2018-09-25 DIAGNOSIS — C189 Malignant neoplasm of colon, unspecified: Secondary | ICD-10-CM | POA: Diagnosis not present

## 2018-09-25 DIAGNOSIS — I10 Essential (primary) hypertension: Secondary | ICD-10-CM | POA: Diagnosis not present

## 2018-09-25 DIAGNOSIS — D61818 Other pancytopenia: Secondary | ICD-10-CM | POA: Diagnosis not present

## 2018-09-27 DIAGNOSIS — D61818 Other pancytopenia: Secondary | ICD-10-CM | POA: Diagnosis not present

## 2018-09-27 DIAGNOSIS — C189 Malignant neoplasm of colon, unspecified: Secondary | ICD-10-CM | POA: Diagnosis not present

## 2018-09-27 DIAGNOSIS — I10 Essential (primary) hypertension: Secondary | ICD-10-CM | POA: Diagnosis not present

## 2018-09-27 DIAGNOSIS — F411 Generalized anxiety disorder: Secondary | ICD-10-CM | POA: Diagnosis not present

## 2018-09-27 DIAGNOSIS — D469 Myelodysplastic syndrome, unspecified: Secondary | ICD-10-CM | POA: Diagnosis not present

## 2018-09-27 DIAGNOSIS — N183 Chronic kidney disease, stage 3 (moderate): Secondary | ICD-10-CM | POA: Diagnosis not present

## 2018-10-01 DIAGNOSIS — N183 Chronic kidney disease, stage 3 (moderate): Secondary | ICD-10-CM | POA: Diagnosis not present

## 2018-10-01 DIAGNOSIS — C189 Malignant neoplasm of colon, unspecified: Secondary | ICD-10-CM | POA: Diagnosis not present

## 2018-10-01 DIAGNOSIS — F411 Generalized anxiety disorder: Secondary | ICD-10-CM | POA: Diagnosis not present

## 2018-10-01 DIAGNOSIS — D469 Myelodysplastic syndrome, unspecified: Secondary | ICD-10-CM | POA: Diagnosis not present

## 2018-10-01 DIAGNOSIS — I10 Essential (primary) hypertension: Secondary | ICD-10-CM | POA: Diagnosis not present

## 2018-10-01 DIAGNOSIS — D61818 Other pancytopenia: Secondary | ICD-10-CM | POA: Diagnosis not present

## 2018-10-03 DIAGNOSIS — D61818 Other pancytopenia: Secondary | ICD-10-CM | POA: Diagnosis not present

## 2018-10-03 DIAGNOSIS — N183 Chronic kidney disease, stage 3 (moderate): Secondary | ICD-10-CM | POA: Diagnosis not present

## 2018-10-03 DIAGNOSIS — F411 Generalized anxiety disorder: Secondary | ICD-10-CM | POA: Diagnosis not present

## 2018-10-03 DIAGNOSIS — D469 Myelodysplastic syndrome, unspecified: Secondary | ICD-10-CM | POA: Diagnosis not present

## 2018-10-03 DIAGNOSIS — I10 Essential (primary) hypertension: Secondary | ICD-10-CM | POA: Diagnosis not present

## 2018-10-03 DIAGNOSIS — C189 Malignant neoplasm of colon, unspecified: Secondary | ICD-10-CM | POA: Diagnosis not present

## 2018-10-04 DIAGNOSIS — D469 Myelodysplastic syndrome, unspecified: Secondary | ICD-10-CM | POA: Diagnosis not present

## 2018-10-04 DIAGNOSIS — C189 Malignant neoplasm of colon, unspecified: Secondary | ICD-10-CM | POA: Diagnosis not present

## 2018-10-04 DIAGNOSIS — N183 Chronic kidney disease, stage 3 (moderate): Secondary | ICD-10-CM | POA: Diagnosis not present

## 2018-10-04 DIAGNOSIS — I10 Essential (primary) hypertension: Secondary | ICD-10-CM | POA: Diagnosis not present

## 2018-10-04 DIAGNOSIS — D61818 Other pancytopenia: Secondary | ICD-10-CM | POA: Diagnosis not present

## 2018-10-04 DIAGNOSIS — F411 Generalized anxiety disorder: Secondary | ICD-10-CM | POA: Diagnosis not present

## 2018-10-08 DIAGNOSIS — N183 Chronic kidney disease, stage 3 (moderate): Secondary | ICD-10-CM | POA: Diagnosis not present

## 2018-10-08 DIAGNOSIS — F411 Generalized anxiety disorder: Secondary | ICD-10-CM | POA: Diagnosis not present

## 2018-10-08 DIAGNOSIS — C189 Malignant neoplasm of colon, unspecified: Secondary | ICD-10-CM | POA: Diagnosis not present

## 2018-10-08 DIAGNOSIS — D469 Myelodysplastic syndrome, unspecified: Secondary | ICD-10-CM | POA: Diagnosis not present

## 2018-10-08 DIAGNOSIS — D61818 Other pancytopenia: Secondary | ICD-10-CM | POA: Diagnosis not present

## 2018-10-08 DIAGNOSIS — I10 Essential (primary) hypertension: Secondary | ICD-10-CM | POA: Diagnosis not present

## 2018-10-10 DIAGNOSIS — F411 Generalized anxiety disorder: Secondary | ICD-10-CM | POA: Diagnosis not present

## 2018-10-10 DIAGNOSIS — D61818 Other pancytopenia: Secondary | ICD-10-CM | POA: Diagnosis not present

## 2018-10-10 DIAGNOSIS — I10 Essential (primary) hypertension: Secondary | ICD-10-CM | POA: Diagnosis not present

## 2018-10-10 DIAGNOSIS — C189 Malignant neoplasm of colon, unspecified: Secondary | ICD-10-CM | POA: Diagnosis not present

## 2018-10-10 DIAGNOSIS — D469 Myelodysplastic syndrome, unspecified: Secondary | ICD-10-CM | POA: Diagnosis not present

## 2018-10-10 DIAGNOSIS — N183 Chronic kidney disease, stage 3 (moderate): Secondary | ICD-10-CM | POA: Diagnosis not present

## 2018-10-11 DIAGNOSIS — C189 Malignant neoplasm of colon, unspecified: Secondary | ICD-10-CM | POA: Diagnosis not present

## 2018-10-11 DIAGNOSIS — N183 Chronic kidney disease, stage 3 (moderate): Secondary | ICD-10-CM | POA: Diagnosis not present

## 2018-10-11 DIAGNOSIS — F411 Generalized anxiety disorder: Secondary | ICD-10-CM | POA: Diagnosis not present

## 2018-10-11 DIAGNOSIS — D469 Myelodysplastic syndrome, unspecified: Secondary | ICD-10-CM | POA: Diagnosis not present

## 2018-10-11 DIAGNOSIS — D61818 Other pancytopenia: Secondary | ICD-10-CM | POA: Diagnosis not present

## 2018-10-11 DIAGNOSIS — I10 Essential (primary) hypertension: Secondary | ICD-10-CM | POA: Diagnosis not present

## 2018-10-14 DIAGNOSIS — H35431 Paving stone degeneration of retina, right eye: Secondary | ICD-10-CM | POA: Diagnosis not present

## 2018-10-14 DIAGNOSIS — H353132 Nonexudative age-related macular degeneration, bilateral, intermediate dry stage: Secondary | ICD-10-CM | POA: Diagnosis not present

## 2018-10-14 DIAGNOSIS — H353212 Exudative age-related macular degeneration, right eye, with inactive choroidal neovascularization: Secondary | ICD-10-CM | POA: Diagnosis not present

## 2018-10-14 DIAGNOSIS — H353211 Exudative age-related macular degeneration, right eye, with active choroidal neovascularization: Secondary | ICD-10-CM | POA: Diagnosis not present

## 2018-10-15 ENCOUNTER — Other Ambulatory Visit: Payer: Self-pay | Admitting: Oncology

## 2018-10-15 DIAGNOSIS — D469 Myelodysplastic syndrome, unspecified: Secondary | ICD-10-CM | POA: Diagnosis not present

## 2018-10-15 DIAGNOSIS — F411 Generalized anxiety disorder: Secondary | ICD-10-CM | POA: Diagnosis not present

## 2018-10-15 DIAGNOSIS — C189 Malignant neoplasm of colon, unspecified: Secondary | ICD-10-CM | POA: Diagnosis not present

## 2018-10-15 DIAGNOSIS — D61818 Other pancytopenia: Secondary | ICD-10-CM | POA: Diagnosis not present

## 2018-10-15 DIAGNOSIS — N183 Chronic kidney disease, stage 3 (moderate): Secondary | ICD-10-CM | POA: Diagnosis not present

## 2018-10-15 DIAGNOSIS — I10 Essential (primary) hypertension: Secondary | ICD-10-CM | POA: Diagnosis not present

## 2018-10-18 DIAGNOSIS — N183 Chronic kidney disease, stage 3 (moderate): Secondary | ICD-10-CM | POA: Diagnosis not present

## 2018-10-18 DIAGNOSIS — I10 Essential (primary) hypertension: Secondary | ICD-10-CM | POA: Diagnosis not present

## 2018-10-18 DIAGNOSIS — F411 Generalized anxiety disorder: Secondary | ICD-10-CM | POA: Diagnosis not present

## 2018-10-18 DIAGNOSIS — D61818 Other pancytopenia: Secondary | ICD-10-CM | POA: Diagnosis not present

## 2018-10-18 DIAGNOSIS — C189 Malignant neoplasm of colon, unspecified: Secondary | ICD-10-CM | POA: Diagnosis not present

## 2018-10-18 DIAGNOSIS — D469 Myelodysplastic syndrome, unspecified: Secondary | ICD-10-CM | POA: Diagnosis not present

## 2018-10-20 DIAGNOSIS — D61818 Other pancytopenia: Secondary | ICD-10-CM | POA: Diagnosis not present

## 2018-10-20 DIAGNOSIS — H9319 Tinnitus, unspecified ear: Secondary | ICD-10-CM | POA: Diagnosis not present

## 2018-10-20 DIAGNOSIS — K219 Gastro-esophageal reflux disease without esophagitis: Secondary | ICD-10-CM | POA: Diagnosis not present

## 2018-10-20 DIAGNOSIS — L209 Atopic dermatitis, unspecified: Secondary | ICD-10-CM | POA: Diagnosis not present

## 2018-10-20 DIAGNOSIS — C189 Malignant neoplasm of colon, unspecified: Secondary | ICD-10-CM | POA: Diagnosis not present

## 2018-10-20 DIAGNOSIS — N183 Chronic kidney disease, stage 3 (moderate): Secondary | ICD-10-CM | POA: Diagnosis not present

## 2018-10-20 DIAGNOSIS — J309 Allergic rhinitis, unspecified: Secondary | ICD-10-CM | POA: Diagnosis not present

## 2018-10-20 DIAGNOSIS — I10 Essential (primary) hypertension: Secondary | ICD-10-CM | POA: Diagnosis not present

## 2018-10-20 DIAGNOSIS — D469 Myelodysplastic syndrome, unspecified: Secondary | ICD-10-CM | POA: Diagnosis not present

## 2018-10-20 DIAGNOSIS — F411 Generalized anxiety disorder: Secondary | ICD-10-CM | POA: Diagnosis not present

## 2018-10-20 DIAGNOSIS — H3530 Unspecified macular degeneration: Secondary | ICD-10-CM | POA: Diagnosis not present

## 2018-10-22 DIAGNOSIS — D61818 Other pancytopenia: Secondary | ICD-10-CM | POA: Diagnosis not present

## 2018-10-22 DIAGNOSIS — F411 Generalized anxiety disorder: Secondary | ICD-10-CM | POA: Diagnosis not present

## 2018-10-22 DIAGNOSIS — I10 Essential (primary) hypertension: Secondary | ICD-10-CM | POA: Diagnosis not present

## 2018-10-22 DIAGNOSIS — C189 Malignant neoplasm of colon, unspecified: Secondary | ICD-10-CM | POA: Diagnosis not present

## 2018-10-22 DIAGNOSIS — N183 Chronic kidney disease, stage 3 (moderate): Secondary | ICD-10-CM | POA: Diagnosis not present

## 2018-10-22 DIAGNOSIS — D469 Myelodysplastic syndrome, unspecified: Secondary | ICD-10-CM | POA: Diagnosis not present

## 2018-10-25 DIAGNOSIS — D61818 Other pancytopenia: Secondary | ICD-10-CM | POA: Diagnosis not present

## 2018-10-25 DIAGNOSIS — D469 Myelodysplastic syndrome, unspecified: Secondary | ICD-10-CM | POA: Diagnosis not present

## 2018-10-25 DIAGNOSIS — N183 Chronic kidney disease, stage 3 (moderate): Secondary | ICD-10-CM | POA: Diagnosis not present

## 2018-10-25 DIAGNOSIS — F411 Generalized anxiety disorder: Secondary | ICD-10-CM | POA: Diagnosis not present

## 2018-10-25 DIAGNOSIS — C189 Malignant neoplasm of colon, unspecified: Secondary | ICD-10-CM | POA: Diagnosis not present

## 2018-10-25 DIAGNOSIS — I10 Essential (primary) hypertension: Secondary | ICD-10-CM | POA: Diagnosis not present

## 2018-10-28 DIAGNOSIS — C189 Malignant neoplasm of colon, unspecified: Secondary | ICD-10-CM | POA: Diagnosis not present

## 2018-10-28 DIAGNOSIS — D469 Myelodysplastic syndrome, unspecified: Secondary | ICD-10-CM | POA: Diagnosis not present

## 2018-10-28 DIAGNOSIS — D61818 Other pancytopenia: Secondary | ICD-10-CM | POA: Diagnosis not present

## 2018-10-28 DIAGNOSIS — I10 Essential (primary) hypertension: Secondary | ICD-10-CM | POA: Diagnosis not present

## 2018-10-28 DIAGNOSIS — N183 Chronic kidney disease, stage 3 (moderate): Secondary | ICD-10-CM | POA: Diagnosis not present

## 2018-10-28 DIAGNOSIS — F411 Generalized anxiety disorder: Secondary | ICD-10-CM | POA: Diagnosis not present

## 2018-10-29 DIAGNOSIS — N183 Chronic kidney disease, stage 3 (moderate): Secondary | ICD-10-CM | POA: Diagnosis not present

## 2018-10-29 DIAGNOSIS — D469 Myelodysplastic syndrome, unspecified: Secondary | ICD-10-CM | POA: Diagnosis not present

## 2018-10-29 DIAGNOSIS — F411 Generalized anxiety disorder: Secondary | ICD-10-CM | POA: Diagnosis not present

## 2018-10-29 DIAGNOSIS — D61818 Other pancytopenia: Secondary | ICD-10-CM | POA: Diagnosis not present

## 2018-10-29 DIAGNOSIS — I10 Essential (primary) hypertension: Secondary | ICD-10-CM | POA: Diagnosis not present

## 2018-10-29 DIAGNOSIS — C189 Malignant neoplasm of colon, unspecified: Secondary | ICD-10-CM | POA: Diagnosis not present

## 2018-11-01 DIAGNOSIS — D61818 Other pancytopenia: Secondary | ICD-10-CM | POA: Diagnosis not present

## 2018-11-01 DIAGNOSIS — D469 Myelodysplastic syndrome, unspecified: Secondary | ICD-10-CM | POA: Diagnosis not present

## 2018-11-01 DIAGNOSIS — C189 Malignant neoplasm of colon, unspecified: Secondary | ICD-10-CM | POA: Diagnosis not present

## 2018-11-01 DIAGNOSIS — F411 Generalized anxiety disorder: Secondary | ICD-10-CM | POA: Diagnosis not present

## 2018-11-01 DIAGNOSIS — I10 Essential (primary) hypertension: Secondary | ICD-10-CM | POA: Diagnosis not present

## 2018-11-01 DIAGNOSIS — N183 Chronic kidney disease, stage 3 (moderate): Secondary | ICD-10-CM | POA: Diagnosis not present

## 2018-11-05 ENCOUNTER — Telehealth: Payer: Self-pay | Admitting: *Deleted

## 2018-11-05 DIAGNOSIS — C189 Malignant neoplasm of colon, unspecified: Secondary | ICD-10-CM | POA: Diagnosis not present

## 2018-11-05 DIAGNOSIS — D61818 Other pancytopenia: Secondary | ICD-10-CM | POA: Diagnosis not present

## 2018-11-05 DIAGNOSIS — F411 Generalized anxiety disorder: Secondary | ICD-10-CM | POA: Diagnosis not present

## 2018-11-05 DIAGNOSIS — D469 Myelodysplastic syndrome, unspecified: Secondary | ICD-10-CM | POA: Diagnosis not present

## 2018-11-05 DIAGNOSIS — N183 Chronic kidney disease, stage 3 (moderate): Secondary | ICD-10-CM | POA: Diagnosis not present

## 2018-11-05 DIAGNOSIS — I10 Essential (primary) hypertension: Secondary | ICD-10-CM

## 2018-11-05 MED ORDER — TRAZODONE HCL 150 MG PO TABS: 300.0000 mg | ORAL_TABLET | Freq: Every day | ORAL | 5 refills | Status: AC

## 2018-11-05 MED ORDER — METOPROLOL SUCCINATE ER 25 MG PO TB24: 25.0000 mg | ORAL_TABLET | Freq: Every day | ORAL | 5 refills | Status: AC

## 2018-11-05 NOTE — Telephone Encounter (Signed)
[facility] provider ([employer]) requests the trazodone 300 mg #1 po at bedtime be changed to 150 mg #2 at bedtime for cost issue. Also needs refill on his metoprolol succinate ER

## 2018-11-08 DIAGNOSIS — N183 Chronic kidney disease, stage 3 (moderate): Secondary | ICD-10-CM | POA: Diagnosis not present

## 2018-11-08 DIAGNOSIS — D61818 Other pancytopenia: Secondary | ICD-10-CM | POA: Diagnosis not present

## 2018-11-08 DIAGNOSIS — F411 Generalized anxiety disorder: Secondary | ICD-10-CM | POA: Diagnosis not present

## 2018-11-08 DIAGNOSIS — I10 Essential (primary) hypertension: Secondary | ICD-10-CM | POA: Diagnosis not present

## 2018-11-08 DIAGNOSIS — C189 Malignant neoplasm of colon, unspecified: Secondary | ICD-10-CM | POA: Diagnosis not present

## 2018-11-08 DIAGNOSIS — D469 Myelodysplastic syndrome, unspecified: Secondary | ICD-10-CM | POA: Diagnosis not present

## 2018-11-12 DIAGNOSIS — C189 Malignant neoplasm of colon, unspecified: Secondary | ICD-10-CM | POA: Diagnosis not present

## 2018-11-12 DIAGNOSIS — D469 Myelodysplastic syndrome, unspecified: Secondary | ICD-10-CM | POA: Diagnosis not present

## 2018-11-12 DIAGNOSIS — D61818 Other pancytopenia: Secondary | ICD-10-CM | POA: Diagnosis not present

## 2018-11-12 DIAGNOSIS — I10 Essential (primary) hypertension: Secondary | ICD-10-CM | POA: Diagnosis not present

## 2018-11-12 DIAGNOSIS — F411 Generalized anxiety disorder: Secondary | ICD-10-CM | POA: Diagnosis not present

## 2018-11-12 DIAGNOSIS — N183 Chronic kidney disease, stage 3 (moderate): Secondary | ICD-10-CM | POA: Diagnosis not present

## 2018-11-15 DIAGNOSIS — I10 Essential (primary) hypertension: Secondary | ICD-10-CM | POA: Diagnosis not present

## 2018-11-15 DIAGNOSIS — C189 Malignant neoplasm of colon, unspecified: Secondary | ICD-10-CM | POA: Diagnosis not present

## 2018-11-15 DIAGNOSIS — N183 Chronic kidney disease, stage 3 (moderate): Secondary | ICD-10-CM | POA: Diagnosis not present

## 2018-11-15 DIAGNOSIS — D61818 Other pancytopenia: Secondary | ICD-10-CM | POA: Diagnosis not present

## 2018-11-15 DIAGNOSIS — D469 Myelodysplastic syndrome, unspecified: Secondary | ICD-10-CM | POA: Diagnosis not present

## 2018-11-15 DIAGNOSIS — F411 Generalized anxiety disorder: Secondary | ICD-10-CM | POA: Diagnosis not present

## 2018-11-20 DIAGNOSIS — C189 Malignant neoplasm of colon, unspecified: Secondary | ICD-10-CM | POA: Diagnosis not present

## 2018-11-20 DIAGNOSIS — D61818 Other pancytopenia: Secondary | ICD-10-CM | POA: Diagnosis not present

## 2018-11-20 DIAGNOSIS — N183 Chronic kidney disease, stage 3 (moderate): Secondary | ICD-10-CM | POA: Diagnosis not present

## 2018-11-20 DIAGNOSIS — L209 Atopic dermatitis, unspecified: Secondary | ICD-10-CM | POA: Diagnosis not present

## 2018-11-20 DIAGNOSIS — I129 Hypertensive chronic kidney disease with stage 1 through stage 4 chronic kidney disease, or unspecified chronic kidney disease: Secondary | ICD-10-CM | POA: Diagnosis not present

## 2018-11-20 DIAGNOSIS — H9319 Tinnitus, unspecified ear: Secondary | ICD-10-CM | POA: Diagnosis not present

## 2018-11-20 DIAGNOSIS — K219 Gastro-esophageal reflux disease without esophagitis: Secondary | ICD-10-CM | POA: Diagnosis not present

## 2018-11-20 DIAGNOSIS — J309 Allergic rhinitis, unspecified: Secondary | ICD-10-CM | POA: Diagnosis not present

## 2018-11-20 DIAGNOSIS — H3530 Unspecified macular degeneration: Secondary | ICD-10-CM | POA: Diagnosis not present

## 2018-11-20 DIAGNOSIS — D4622 Refractory anemia with excess of blasts 2: Secondary | ICD-10-CM | POA: Diagnosis not present

## 2018-12-04 DIAGNOSIS — K219 Gastro-esophageal reflux disease without esophagitis: Secondary | ICD-10-CM | POA: Diagnosis not present

## 2018-12-04 DIAGNOSIS — D4622 Refractory anemia with excess of blasts 2: Secondary | ICD-10-CM | POA: Diagnosis not present

## 2018-12-04 DIAGNOSIS — D61818 Other pancytopenia: Secondary | ICD-10-CM | POA: Diagnosis not present

## 2018-12-04 DIAGNOSIS — C189 Malignant neoplasm of colon, unspecified: Secondary | ICD-10-CM | POA: Diagnosis not present

## 2018-12-04 DIAGNOSIS — N183 Chronic kidney disease, stage 3 (moderate): Secondary | ICD-10-CM | POA: Diagnosis not present

## 2018-12-04 DIAGNOSIS — I129 Hypertensive chronic kidney disease with stage 1 through stage 4 chronic kidney disease, or unspecified chronic kidney disease: Secondary | ICD-10-CM | POA: Diagnosis not present

## 2018-12-09 ENCOUNTER — Telehealth: Payer: Self-pay | Admitting: *Deleted

## 2018-12-09 NOTE — Telephone Encounter (Signed)
Patient does not want to come in on 12/16/18. Informed son that it will be canceled and rescheduled for sometime in June. Son would like to be called with the appointment and needs a Monday or Tuesday (work schedule). Scheduling message sent.

## 2018-12-10 ENCOUNTER — Telehealth: Payer: Self-pay | Admitting: Oncology

## 2018-12-10 NOTE — Telephone Encounter (Signed)
scheudled June appt per sch msg. Mailed printout

## 2018-12-16 ENCOUNTER — Other Ambulatory Visit: Payer: PRIVATE HEALTH INSURANCE

## 2018-12-16 ENCOUNTER — Ambulatory Visit: Payer: PRIVATE HEALTH INSURANCE | Admitting: Oncology

## 2018-12-19 ENCOUNTER — Telehealth: Payer: Self-pay | Admitting: *Deleted

## 2018-12-19 DIAGNOSIS — D61818 Other pancytopenia: Secondary | ICD-10-CM | POA: Diagnosis not present

## 2018-12-19 DIAGNOSIS — K219 Gastro-esophageal reflux disease without esophagitis: Secondary | ICD-10-CM | POA: Diagnosis not present

## 2018-12-19 DIAGNOSIS — C189 Malignant neoplasm of colon, unspecified: Secondary | ICD-10-CM | POA: Diagnosis not present

## 2018-12-19 DIAGNOSIS — I129 Hypertensive chronic kidney disease with stage 1 through stage 4 chronic kidney disease, or unspecified chronic kidney disease: Secondary | ICD-10-CM | POA: Diagnosis not present

## 2018-12-19 DIAGNOSIS — N183 Chronic kidney disease, stage 3 (moderate): Secondary | ICD-10-CM | POA: Diagnosis not present

## 2018-12-19 DIAGNOSIS — D4622 Refractory anemia with excess of blasts 2: Secondary | ICD-10-CM | POA: Diagnosis not present

## 2018-12-19 NOTE — Telephone Encounter (Signed)
Hospice RN to home to check on him today and found dead in floor. Most likely died early am. She pronounced him at 11:03 am. Family on the way to home.

## 2018-12-20 DEATH — deceased

## 2019-02-04 ENCOUNTER — Ambulatory Visit: Payer: Medicare Other | Admitting: Oncology

## 2019-02-04 ENCOUNTER — Other Ambulatory Visit: Payer: Medicare Other
